# Patient Record
Sex: Female | Born: 1950 | Race: White | Hispanic: No | Marital: Single | Smoking: Current some day smoker
Health system: Southern US, Community
[De-identification: ages and names within clinical notes are randomized; demographics above are authoritative.]

## PROBLEM LIST (undated history)

## (undated) DIAGNOSIS — J449 Chronic obstructive pulmonary disease, unspecified: Secondary | ICD-10-CM

## (undated) DIAGNOSIS — M545 Low back pain, unspecified: Secondary | ICD-10-CM

## (undated) DIAGNOSIS — I209 Angina pectoris, unspecified: Secondary | ICD-10-CM

## (undated) DIAGNOSIS — R0902 Hypoxemia: Secondary | ICD-10-CM

## (undated) DIAGNOSIS — I5030 Unspecified diastolic (congestive) heart failure: Secondary | ICD-10-CM

## (undated) DIAGNOSIS — M810 Age-related osteoporosis without current pathological fracture: Secondary | ICD-10-CM

## (undated) DIAGNOSIS — I739 Peripheral vascular disease, unspecified: Secondary | ICD-10-CM

## (undated) DIAGNOSIS — I10 Essential (primary) hypertension: Secondary | ICD-10-CM

## (undated) DIAGNOSIS — G8929 Other chronic pain: Secondary | ICD-10-CM

## (undated) DIAGNOSIS — I639 Cerebral infarction, unspecified: Secondary | ICD-10-CM

## (undated) DIAGNOSIS — K219 Gastro-esophageal reflux disease without esophagitis: Secondary | ICD-10-CM

## (undated) DIAGNOSIS — E7800 Pure hypercholesterolemia, unspecified: Secondary | ICD-10-CM

## (undated) DIAGNOSIS — K635 Polyp of colon: Secondary | ICD-10-CM

## (undated) DIAGNOSIS — Z8719 Personal history of other diseases of the digestive system: Secondary | ICD-10-CM

## (undated) DIAGNOSIS — I2510 Atherosclerotic heart disease of native coronary artery without angina pectoris: Secondary | ICD-10-CM

## (undated) DIAGNOSIS — E669 Obesity, unspecified: Secondary | ICD-10-CM

## (undated) DIAGNOSIS — D126 Benign neoplasm of colon, unspecified: Secondary | ICD-10-CM

## (undated) DIAGNOSIS — E785 Hyperlipidemia, unspecified: Secondary | ICD-10-CM

## (undated) DIAGNOSIS — M1990 Unspecified osteoarthritis, unspecified site: Secondary | ICD-10-CM

## (undated) HISTORY — PX: EYE SURGERY: SHX253

## (undated) HISTORY — PX: ABDOMINAL HYSTERECTOMY: SHX81

## (undated) HISTORY — PX: PARTIAL HYSTERECTOMY: SHX80

## (undated) HISTORY — PX: VEIN LIGATION AND STRIPPING: SHX2653

## (undated) HISTORY — PX: APPENDECTOMY: SHX54

## (undated) HISTORY — PX: CHOLECYSTECTOMY: SHX55

## (undated) HISTORY — PX: BACK SURGERY: SHX140

---

## 2000-10-19 ENCOUNTER — Ambulatory Visit (HOSPITAL_COMMUNITY): Admission: RE | Admit: 2000-10-19 | Discharge: 2000-10-19 | Payer: Self-pay | Admitting: General Surgery

## 2000-12-19 ENCOUNTER — Encounter: Payer: Self-pay | Admitting: Internal Medicine

## 2000-12-19 ENCOUNTER — Ambulatory Visit (HOSPITAL_COMMUNITY): Admission: RE | Admit: 2000-12-19 | Discharge: 2000-12-19 | Payer: Self-pay | Admitting: Internal Medicine

## 2001-01-02 ENCOUNTER — Emergency Department (HOSPITAL_COMMUNITY): Admission: EM | Admit: 2001-01-02 | Discharge: 2001-01-03 | Payer: Self-pay | Admitting: *Deleted

## 2001-01-11 ENCOUNTER — Ambulatory Visit (HOSPITAL_COMMUNITY): Admission: RE | Admit: 2001-01-11 | Discharge: 2001-01-11 | Payer: Self-pay | Admitting: Internal Medicine

## 2001-01-11 ENCOUNTER — Encounter: Payer: Self-pay | Admitting: Internal Medicine

## 2001-01-13 ENCOUNTER — Ambulatory Visit (HOSPITAL_COMMUNITY): Admission: RE | Admit: 2001-01-13 | Discharge: 2001-01-13 | Payer: Self-pay | Admitting: Internal Medicine

## 2001-01-13 ENCOUNTER — Encounter: Payer: Self-pay | Admitting: Internal Medicine

## 2001-01-31 ENCOUNTER — Encounter: Payer: Self-pay | Admitting: Orthopedic Surgery

## 2001-01-31 ENCOUNTER — Encounter: Admission: RE | Admit: 2001-01-31 | Discharge: 2001-01-31 | Payer: Self-pay | Admitting: *Deleted

## 2001-02-01 ENCOUNTER — Emergency Department (HOSPITAL_COMMUNITY): Admission: EM | Admit: 2001-02-01 | Discharge: 2001-02-01 | Payer: Self-pay | Admitting: Emergency Medicine

## 2002-12-03 ENCOUNTER — Inpatient Hospital Stay (HOSPITAL_COMMUNITY): Admission: RE | Admit: 2002-12-03 | Discharge: 2002-12-07 | Payer: Self-pay | Admitting: Critical Care Medicine

## 2002-12-16 ENCOUNTER — Emergency Department (HOSPITAL_COMMUNITY): Admission: EM | Admit: 2002-12-16 | Discharge: 2002-12-16 | Payer: Self-pay | Admitting: Emergency Medicine

## 2003-02-19 ENCOUNTER — Emergency Department (HOSPITAL_COMMUNITY): Admission: EM | Admit: 2003-02-19 | Discharge: 2003-02-20 | Payer: Self-pay | Admitting: Emergency Medicine

## 2003-02-21 ENCOUNTER — Inpatient Hospital Stay (HOSPITAL_COMMUNITY): Admission: AD | Admit: 2003-02-21 | Discharge: 2003-02-25 | Payer: Self-pay | Admitting: Critical Care Medicine

## 2003-02-26 ENCOUNTER — Inpatient Hospital Stay (HOSPITAL_COMMUNITY): Admission: EM | Admit: 2003-02-26 | Discharge: 2003-03-04 | Payer: Self-pay | Admitting: Pulmonary Disease

## 2003-03-08 ENCOUNTER — Inpatient Hospital Stay (HOSPITAL_COMMUNITY): Admission: AD | Admit: 2003-03-08 | Discharge: 2003-03-11 | Payer: Self-pay | Admitting: Critical Care Medicine

## 2003-03-17 ENCOUNTER — Other Ambulatory Visit: Payer: Self-pay

## 2003-03-18 ENCOUNTER — Other Ambulatory Visit: Payer: Self-pay

## 2003-04-02 ENCOUNTER — Other Ambulatory Visit: Payer: Self-pay

## 2003-12-17 ENCOUNTER — Ambulatory Visit: Payer: Self-pay | Admitting: Critical Care Medicine

## 2004-03-05 ENCOUNTER — Observation Stay: Payer: Self-pay | Admitting: Internal Medicine

## 2004-03-05 ENCOUNTER — Other Ambulatory Visit: Payer: Self-pay

## 2004-04-07 ENCOUNTER — Emergency Department: Payer: Self-pay | Admitting: Emergency Medicine

## 2004-05-19 ENCOUNTER — Encounter: Admission: RE | Admit: 2004-05-19 | Discharge: 2004-05-19 | Payer: Self-pay | Admitting: *Deleted

## 2004-06-24 ENCOUNTER — Emergency Department: Payer: Self-pay | Admitting: Emergency Medicine

## 2004-06-24 ENCOUNTER — Other Ambulatory Visit: Payer: Self-pay

## 2004-08-20 ENCOUNTER — Inpatient Hospital Stay: Payer: Self-pay | Admitting: Cardiovascular Disease

## 2004-08-20 ENCOUNTER — Other Ambulatory Visit: Payer: Self-pay

## 2004-12-24 ENCOUNTER — Emergency Department: Payer: Self-pay | Admitting: Emergency Medicine

## 2004-12-24 ENCOUNTER — Other Ambulatory Visit: Payer: Self-pay

## 2005-01-12 ENCOUNTER — Inpatient Hospital Stay: Payer: Self-pay | Admitting: Internal Medicine

## 2005-01-12 ENCOUNTER — Other Ambulatory Visit: Payer: Self-pay

## 2005-02-08 ENCOUNTER — Inpatient Hospital Stay: Payer: Self-pay | Admitting: Surgery

## 2005-03-16 ENCOUNTER — Ambulatory Visit: Payer: Self-pay | Admitting: Gastroenterology

## 2005-09-29 ENCOUNTER — Emergency Department: Payer: Self-pay | Admitting: Emergency Medicine

## 2005-09-29 ENCOUNTER — Other Ambulatory Visit: Payer: Self-pay

## 2006-01-04 HISTORY — PX: CORONARY ANGIOPLASTY: SHX604

## 2006-04-20 ENCOUNTER — Ambulatory Visit: Payer: Self-pay | Admitting: Gastroenterology

## 2006-10-12 ENCOUNTER — Inpatient Hospital Stay: Payer: Self-pay | Admitting: Internal Medicine

## 2006-10-12 ENCOUNTER — Other Ambulatory Visit: Payer: Self-pay

## 2006-12-11 ENCOUNTER — Other Ambulatory Visit: Payer: Self-pay

## 2006-12-11 ENCOUNTER — Emergency Department: Payer: Self-pay | Admitting: Emergency Medicine

## 2007-01-05 HISTORY — PX: CAROTID ENDARTERECTOMY: SUR193

## 2007-03-20 ENCOUNTER — Emergency Department: Payer: Self-pay | Admitting: Emergency Medicine

## 2007-10-11 ENCOUNTER — Ambulatory Visit: Payer: Self-pay | Admitting: Unknown Physician Specialty

## 2007-11-27 ENCOUNTER — Ambulatory Visit: Payer: Self-pay | Admitting: Gastroenterology

## 2007-12-09 ENCOUNTER — Inpatient Hospital Stay: Payer: Self-pay | Admitting: *Deleted

## 2008-01-05 HISTORY — PX: IMPLANTATION BONE ANCHORED HEARING AID: SUR691

## 2008-01-15 ENCOUNTER — Emergency Department: Payer: Self-pay | Admitting: Internal Medicine

## 2009-09-01 ENCOUNTER — Ambulatory Visit: Payer: Self-pay | Admitting: Gastroenterology

## 2010-07-17 ENCOUNTER — Ambulatory Visit: Payer: Self-pay | Admitting: Family Medicine

## 2010-12-05 ENCOUNTER — Emergency Department: Payer: Self-pay | Admitting: Emergency Medicine

## 2011-03-28 LAB — CBC
HGB: 13.8 g/dL (ref 12.0–16.0)
MCH: 32.4 pg (ref 26.0–34.0)
MCV: 97 fL (ref 80–100)
Platelet: 219 10*3/uL (ref 150–440)
RBC: 4.24 10*6/uL (ref 3.80–5.20)
RDW: 13.8 % (ref 11.5–14.5)

## 2011-03-28 LAB — CK TOTAL AND CKMB (NOT AT ARMC): CK-MB: 0.6 ng/mL (ref 0.5–3.6)

## 2011-03-28 LAB — COMPREHENSIVE METABOLIC PANEL
BUN: 13 mg/dL (ref 7–18)
Calcium, Total: 8.7 mg/dL (ref 8.5–10.1)
Co2: 26 mmol/L (ref 21–32)
Creatinine: 0.8 mg/dL (ref 0.60–1.30)
EGFR (Non-African Amer.): >60
Glucose: 204 mg/dL — ABNORMAL HIGH (ref 65–99)
Osmolality: 287 (ref 275–301)
Potassium: 3.6 mmol/L (ref 3.5–5.1)
SGPT (ALT): 19 U/L
Total Protein: 7.3 g/dL (ref 6.4–8.2)

## 2011-03-29 ENCOUNTER — Inpatient Hospital Stay: Payer: Self-pay | Admitting: Internal Medicine

## 2011-03-30 LAB — HEMOGLOBIN A1C: Hemoglobin A1C: 5.8 % (ref 4.2–6.3)

## 2011-04-03 LAB — CULTURE, BLOOD (SINGLE)

## 2011-08-09 ENCOUNTER — Ambulatory Visit: Payer: Self-pay | Admitting: Internal Medicine

## 2011-08-13 ENCOUNTER — Inpatient Hospital Stay: Payer: Self-pay | Admitting: Internal Medicine

## 2011-08-13 LAB — COMPREHENSIVE METABOLIC PANEL
Albumin: 3.4 g/dL (ref 3.4–5.0)
Anion Gap: 6 — ABNORMAL LOW (ref 7–16)
BUN: 12 mg/dL (ref 7–18)
Calcium, Total: 8.8 mg/dL (ref 8.5–10.1)
Glucose: 113 mg/dL — ABNORMAL HIGH (ref 65–99)
SGOT(AST): 28 U/L (ref 15–37)
SGPT (ALT): 33 U/L (ref 12–78)
Total Protein: 6.8 g/dL (ref 6.4–8.2)

## 2011-08-13 LAB — CBC
MCH: 32.8 pg (ref 26.0–34.0)
MCHC: 34.2 g/dL (ref 32.0–36.0)
Platelet: 190 10*3/uL (ref 150–440)
RBC: 4.03 10*6/uL (ref 3.80–5.20)
RDW: 13.4 % (ref 11.5–14.5)
WBC: 14 10*3/uL — ABNORMAL HIGH (ref 3.6–11.0)

## 2011-08-13 LAB — TROPONIN I: Troponin-I: <0.02 ng/mL

## 2011-08-14 LAB — CBC WITH DIFFERENTIAL/PLATELET
Basophil #: 0 10*3/uL (ref 0.0–0.1)
Eosinophil #: 0 10*3/uL (ref 0.0–0.7)
HCT: 37.6 % (ref 35.0–47.0)
Lymphocyte %: 9.5 %
MCH: 32.2 pg (ref 26.0–34.0)
MCHC: 33.6 g/dL (ref 32.0–36.0)
MCV: 96 fL (ref 80–100)
Monocyte %: 4 %
Neutrophil %: 86.3 %
RDW: 13 % (ref 11.5–14.5)
WBC: 25 10*3/uL — ABNORMAL HIGH (ref 3.6–11.0)

## 2012-10-15 ENCOUNTER — Inpatient Hospital Stay: Payer: Self-pay | Admitting: Family Medicine

## 2012-10-15 LAB — CBC
MCH: 32.4 pg (ref 26.0–34.0)
RBC: 4.11 10*6/uL (ref 3.80–5.20)
RDW: 12.8 % (ref 11.5–14.5)

## 2012-10-15 LAB — COMPREHENSIVE METABOLIC PANEL
Alkaline Phosphatase: 124 U/L (ref 50–136)
Anion Gap: 6 — ABNORMAL LOW (ref 7–16)
BUN: 12 mg/dL (ref 7–18)
Calcium, Total: 9.7 mg/dL (ref 8.5–10.1)
Chloride: 103 mmol/L (ref 98–107)
Creatinine: 0.77 mg/dL (ref 0.60–1.30)
EGFR (African American): >60
SGOT(AST): 15 U/L (ref 15–37)
SGPT (ALT): 20 U/L (ref 12–78)

## 2012-10-15 LAB — TROPONIN I: Troponin-I: <0.02 ng/mL

## 2012-10-16 LAB — CBC WITH DIFFERENTIAL/PLATELET
Eosinophil %: 0 %
Lymphocyte %: 16.9 %
MCHC: 34 g/dL (ref 32.0–36.0)
MCV: 96 fL (ref 80–100)
Neutrophil #: 13.7 10*3/uL — ABNORMAL HIGH (ref 1.4–6.5)
Platelet: 257 10*3/uL (ref 150–440)
RBC: 3.88 10*6/uL (ref 3.80–5.20)
RDW: 13.1 % (ref 11.5–14.5)

## 2012-10-16 LAB — BASIC METABOLIC PANEL
BUN: 17 mg/dL (ref 7–18)
Calcium, Total: 9.6 mg/dL (ref 8.5–10.1)
Chloride: 103 mmol/L (ref 98–107)
Creatinine: 0.74 mg/dL (ref 0.60–1.30)
Potassium: 4.8 mmol/L (ref 3.5–5.1)

## 2012-11-07 ENCOUNTER — Ambulatory Visit: Payer: Self-pay | Admitting: Gastroenterology

## 2012-11-08 LAB — PATHOLOGY REPORT

## 2012-12-31 ENCOUNTER — Inpatient Hospital Stay: Payer: Self-pay | Admitting: Internal Medicine

## 2012-12-31 LAB — BASIC METABOLIC PANEL
BUN: 8 mg/dL (ref 7–18)
Co2: 26 mmol/L (ref 21–32)
EGFR (African American): >60
EGFR (Non-African Amer.): >60
Osmolality: 279 (ref 275–301)
Potassium: 3.8 mmol/L (ref 3.5–5.1)
Sodium: 140 mmol/L (ref 136–145)

## 2012-12-31 LAB — CBC
HGB: 13.4 g/dL (ref 12.0–16.0)
RBC: 4.38 10*6/uL (ref 3.80–5.20)
WBC: 8.1 10*3/uL (ref 3.6–11.0)

## 2012-12-31 LAB — RAPID INFLUENZA A&B ANTIGENS

## 2013-01-01 LAB — BASIC METABOLIC PANEL
Anion Gap: 4 — ABNORMAL LOW (ref 7–16)
BUN: 13 mg/dL (ref 7–18)
Calcium, Total: 9 mg/dL (ref 8.5–10.1)
Co2: 29 mmol/L (ref 21–32)
Creatinine: 0.76 mg/dL (ref 0.60–1.30)
Potassium: 3.8 mmol/L (ref 3.5–5.1)
Sodium: 138 mmol/L (ref 136–145)

## 2013-01-01 LAB — CBC WITH DIFFERENTIAL/PLATELET
HCT: 37.9 % (ref 35.0–47.0)
HGB: 12.6 g/dL (ref 12.0–16.0)
Lymphocyte #: 2.1 10*3/uL (ref 1.0–3.6)
Lymphocyte %: 22.2 %
Monocyte #: 0.2 x10 3/mm (ref 0.2–0.9)
Neutrophil #: 7.2 10*3/uL — ABNORMAL HIGH (ref 1.4–6.5)
Neutrophil %: 75.4 %
WBC: 9.5 10*3/uL (ref 3.6–11.0)

## 2013-01-01 LAB — MAGNESIUM: Magnesium: 1.8 mg/dL

## 2013-01-04 LAB — CREATININE, SERUM
Creatinine: 0.71 mg/dL (ref 0.60–1.30)
EGFR (African American): >60
EGFR (Non-African Amer.): >60

## 2013-01-05 LAB — EXPECTORATED SPUTUM ASSESSMENT W GRAM STAIN, RFLX TO RESP C

## 2013-01-08 ENCOUNTER — Emergency Department: Payer: Self-pay | Admitting: Emergency Medicine

## 2013-01-08 LAB — COMPREHENSIVE METABOLIC PANEL
ALBUMIN: 3 g/dL — AB (ref 3.4–5.0)
Alkaline Phosphatase: 82 U/L
Anion Gap: 3 — ABNORMAL LOW (ref 7–16)
BUN: 11 mg/dL (ref 7–18)
Bilirubin,Total: 0.5 mg/dL (ref 0.2–1.0)
CALCIUM: 8.7 mg/dL (ref 8.5–10.1)
CO2: 34 mmol/L — AB (ref 21–32)
CREATININE: 0.66 mg/dL (ref 0.60–1.30)
Chloride: 98 mmol/L (ref 98–107)
EGFR (African American): >60
Glucose: 102 mg/dL — ABNORMAL HIGH (ref 65–99)
OSMOLALITY: 270 (ref 275–301)
Potassium: 4 mmol/L (ref 3.5–5.1)
SGOT(AST): 25 U/L (ref 15–37)
SGPT (ALT): 47 U/L (ref 12–78)
Sodium: 135 mmol/L — ABNORMAL LOW (ref 136–145)
TOTAL PROTEIN: 6.4 g/dL (ref 6.4–8.2)

## 2013-01-08 LAB — CBC
HCT: 38.8 % (ref 35.0–47.0)
HGB: 13 g/dL (ref 12.0–16.0)
MCH: 31 pg (ref 26.0–34.0)
MCHC: 33.4 g/dL (ref 32.0–36.0)
MCV: 93 fL (ref 80–100)
Platelet: 259 10*3/uL (ref 150–440)
RBC: 4.18 10*6/uL (ref 3.80–5.20)
RDW: 13.3 % (ref 11.5–14.5)
WBC: 23.6 10*3/uL — AB (ref 3.6–11.0)

## 2013-01-08 LAB — PRO B NATRIURETIC PEPTIDE: B-Type Natriuretic Peptide: 176 pg/mL — ABNORMAL HIGH (ref 0–125)

## 2013-01-08 LAB — TROPONIN I

## 2013-01-15 ENCOUNTER — Emergency Department: Payer: Self-pay | Admitting: Emergency Medicine

## 2013-01-15 LAB — COMPREHENSIVE METABOLIC PANEL
ALK PHOS: 83 U/L
ANION GAP: 4 — AB (ref 7–16)
Albumin: 3.1 g/dL — ABNORMAL LOW (ref 3.4–5.0)
BUN: 16 mg/dL (ref 7–18)
Bilirubin,Total: 0.7 mg/dL (ref 0.2–1.0)
CALCIUM: 9 mg/dL (ref 8.5–10.1)
CHLORIDE: 104 mmol/L (ref 98–107)
CO2: 30 mmol/L (ref 21–32)
Creatinine: 0.85 mg/dL (ref 0.60–1.30)
EGFR (African American): >60
Glucose: 97 mg/dL (ref 65–99)
OSMOLALITY: 277 (ref 275–301)
POTASSIUM: 4.1 mmol/L (ref 3.5–5.1)
SGOT(AST): 15 U/L (ref 15–37)
SGPT (ALT): 27 U/L (ref 12–78)
Sodium: 138 mmol/L (ref 136–145)
TOTAL PROTEIN: 7.5 g/dL (ref 6.4–8.2)

## 2013-01-15 LAB — CBC WITH DIFFERENTIAL/PLATELET
Basophil #: 0 10*3/uL (ref 0.0–0.1)
Basophil %: 0.2 %
Eosinophil #: 0.1 10*3/uL (ref 0.0–0.7)
Eosinophil %: 1 %
HCT: 39.2 % (ref 35.0–47.0)
HGB: 12.6 g/dL (ref 12.0–16.0)
LYMPHS ABS: 5.2 10*3/uL — AB (ref 1.0–3.6)
Lymphocyte %: 39.6 %
MCH: 30.5 pg (ref 26.0–34.0)
MCHC: 32.2 g/dL (ref 32.0–36.0)
MCV: 95 fL (ref 80–100)
MONO ABS: 1 x10 3/mm — AB (ref 0.2–0.9)
Monocyte %: 7.9 %
NEUTROS ABS: 6.7 10*3/uL — AB (ref 1.4–6.5)
NEUTROS PCT: 51.3 %
PLATELETS: 247 10*3/uL (ref 150–440)
RBC: 4.14 10*6/uL (ref 3.80–5.20)
RDW: 13.1 % (ref 11.5–14.5)
WBC: 13 10*3/uL — ABNORMAL HIGH (ref 3.6–11.0)

## 2013-01-15 LAB — LIPASE, BLOOD: LIPASE: 75 U/L (ref 73–393)

## 2013-01-15 LAB — CK TOTAL AND CKMB (NOT AT ARMC)
CK, Total: 23 U/L (ref 21–215)
CK-MB: <0.5 ng/mL — ABNORMAL LOW (ref 0.5–3.6)

## 2013-01-15 LAB — MAGNESIUM: Magnesium: 2.2 mg/dL

## 2013-01-15 LAB — TROPONIN I: Troponin-I: <0.02 ng/mL

## 2013-01-16 ENCOUNTER — Emergency Department: Payer: Self-pay | Admitting: Emergency Medicine

## 2013-01-16 LAB — CBC
HCT: 37.2 % (ref 35.0–47.0)
HGB: 12.3 g/dL (ref 12.0–16.0)
MCH: 30.5 pg (ref 26.0–34.0)
MCHC: 33 g/dL (ref 32.0–36.0)
MCV: 93 fL (ref 80–100)
PLATELETS: 242 10*3/uL (ref 150–440)
RBC: 4.02 10*6/uL (ref 3.80–5.20)
RDW: 13 % (ref 11.5–14.5)
WBC: 14.4 10*3/uL — ABNORMAL HIGH (ref 3.6–11.0)

## 2013-01-16 LAB — HEPATIC FUNCTION PANEL A (ARMC)
ALT: 40 U/L (ref 12–78)
Albumin: 3 g/dL — ABNORMAL LOW (ref 3.4–5.0)
Alkaline Phosphatase: 112 U/L
Bilirubin,Total: 0.5 mg/dL (ref 0.2–1.0)
SGOT(AST): 30 U/L (ref 15–37)
Total Protein: 7.4 g/dL (ref 6.4–8.2)

## 2013-01-16 LAB — BASIC METABOLIC PANEL
ANION GAP: 6 — AB (ref 7–16)
BUN: 17 mg/dL (ref 7–18)
CO2: 27 mmol/L (ref 21–32)
Calcium, Total: 9.4 mg/dL (ref 8.5–10.1)
Chloride: 105 mmol/L (ref 98–107)
Creatinine: 0.84 mg/dL (ref 0.60–1.30)
EGFR (Non-African Amer.): >60
Glucose: 109 mg/dL — ABNORMAL HIGH (ref 65–99)
Osmolality: 278 (ref 275–301)
POTASSIUM: 3.9 mmol/L (ref 3.5–5.1)
SODIUM: 138 mmol/L (ref 136–145)

## 2013-01-16 LAB — LIPASE, BLOOD: LIPASE: 84 U/L (ref 73–393)

## 2013-01-16 LAB — TROPONIN I: Troponin-I: <0.02 ng/mL

## 2013-02-16 ENCOUNTER — Inpatient Hospital Stay: Payer: Self-pay | Admitting: Internal Medicine

## 2013-02-16 LAB — COMPREHENSIVE METABOLIC PANEL
ALBUMIN: 3.6 g/dL (ref 3.4–5.0)
ANION GAP: 9 (ref 7–16)
Alkaline Phosphatase: 92 U/L
BILIRUBIN TOTAL: 0.2 mg/dL (ref 0.2–1.0)
BUN: 9 mg/dL (ref 7–18)
CREATININE: 0.85 mg/dL (ref 0.60–1.30)
Calcium, Total: 8.6 mg/dL (ref 8.5–10.1)
Chloride: 106 mmol/L (ref 98–107)
Co2: 25 mmol/L (ref 21–32)
EGFR (African American): >60
GLUCOSE: 116 mg/dL — AB (ref 65–99)
OSMOLALITY: 279 (ref 275–301)
POTASSIUM: 3.7 mmol/L (ref 3.5–5.1)
SGOT(AST): 26 U/L (ref 15–37)
SGPT (ALT): 26 U/L (ref 12–78)
Sodium: 140 mmol/L (ref 136–145)
TOTAL PROTEIN: 7.4 g/dL (ref 6.4–8.2)

## 2013-02-16 LAB — TROPONIN I

## 2013-02-16 LAB — CBC
HCT: 39.4 % (ref 35.0–47.0)
HGB: 13 g/dL (ref 12.0–16.0)
MCH: 31.1 pg (ref 26.0–34.0)
MCHC: 33 g/dL (ref 32.0–36.0)
MCV: 94 fL (ref 80–100)
Platelet: 190 10*3/uL (ref 150–440)
RBC: 4.18 10*6/uL (ref 3.80–5.20)
RDW: 14 % (ref 11.5–14.5)
WBC: 7.7 10*3/uL (ref 3.6–11.0)

## 2013-05-16 ENCOUNTER — Ambulatory Visit: Payer: Self-pay | Admitting: Unknown Physician Specialty

## 2013-06-02 ENCOUNTER — Inpatient Hospital Stay: Payer: Self-pay | Admitting: Internal Medicine

## 2013-06-02 LAB — CBC
HCT: 38.3 % (ref 35.0–47.0)
HGB: 12.5 g/dL (ref 12.0–16.0)
MCH: 31 pg (ref 26.0–34.0)
MCHC: 32.6 g/dL (ref 32.0–36.0)
MCV: 95 fL (ref 80–100)
Platelet: 227 10*3/uL (ref 150–440)
RBC: 4.02 10*6/uL (ref 3.80–5.20)
RDW: 13.2 % (ref 11.5–14.5)
WBC: 12.7 10*3/uL — ABNORMAL HIGH (ref 3.6–11.0)

## 2013-06-02 LAB — BASIC METABOLIC PANEL
ANION GAP: 5 — AB (ref 7–16)
BUN: 15 mg/dL (ref 7–18)
CALCIUM: 8.7 mg/dL (ref 8.5–10.1)
Chloride: 110 mmol/L — ABNORMAL HIGH (ref 98–107)
Co2: 26 mmol/L (ref 21–32)
Creatinine: 0.89 mg/dL (ref 0.60–1.30)
EGFR (Non-African Amer.): >60
Glucose: 94 mg/dL (ref 65–99)
Osmolality: 282 (ref 275–301)
Potassium: 3.7 mmol/L (ref 3.5–5.1)
SODIUM: 141 mmol/L (ref 136–145)

## 2013-06-02 LAB — TROPONIN I: Troponin-I: <0.02 ng/mL

## 2013-06-03 LAB — CBC WITH DIFFERENTIAL/PLATELET
BASOS PCT: 0 %
Basophil #: 0 10*3/uL (ref 0.0–0.1)
EOS PCT: 0 %
Eosinophil #: 0 10*3/uL (ref 0.0–0.7)
HCT: 39 % (ref 35.0–47.0)
HGB: 12.6 g/dL (ref 12.0–16.0)
LYMPHS ABS: 3.4 10*3/uL (ref 1.0–3.6)
Lymphocyte %: 19.6 %
MCH: 30.9 pg (ref 26.0–34.0)
MCHC: 32.3 g/dL (ref 32.0–36.0)
MCV: 96 fL (ref 80–100)
Monocyte #: 0.3 x10 3/mm (ref 0.2–0.9)
Monocyte %: 1.6 %
NEUTROS ABS: 13.7 10*3/uL — AB (ref 1.4–6.5)
Neutrophil %: 78.8 %
Platelet: 244 10*3/uL (ref 150–440)
RBC: 4.08 10*6/uL (ref 3.80–5.20)
RDW: 13.2 % (ref 11.5–14.5)
WBC: 17.4 10*3/uL — AB (ref 3.6–11.0)

## 2013-06-03 LAB — BASIC METABOLIC PANEL
Anion Gap: 9 (ref 7–16)
BUN: 15 mg/dL (ref 7–18)
CALCIUM: 9.1 mg/dL (ref 8.5–10.1)
CHLORIDE: 101 mmol/L (ref 98–107)
CO2: 29 mmol/L (ref 21–32)
CREATININE: 1 mg/dL (ref 0.60–1.30)
EGFR (Non-African Amer.): >60
Glucose: 156 mg/dL — ABNORMAL HIGH (ref 65–99)
Osmolality: 282 (ref 275–301)
Potassium: 3.5 mmol/L (ref 3.5–5.1)
Sodium: 139 mmol/L (ref 136–145)

## 2013-06-05 LAB — CREATININE, SERUM
Creatinine: 0.89 mg/dL (ref 0.60–1.30)
EGFR (African American): >60

## 2013-07-20 ENCOUNTER — Inpatient Hospital Stay: Payer: Self-pay | Admitting: Family Medicine

## 2013-07-20 LAB — BASIC METABOLIC PANEL
ANION GAP: 6 — AB (ref 7–16)
BUN: 15 mg/dL (ref 7–18)
CHLORIDE: 106 mmol/L (ref 98–107)
Calcium, Total: 8.7 mg/dL (ref 8.5–10.1)
Co2: 27 mmol/L (ref 21–32)
Creatinine: 0.8 mg/dL (ref 0.60–1.30)
EGFR (African American): >60
Glucose: 94 mg/dL (ref 65–99)
Osmolality: 278 (ref 275–301)
Potassium: 4.3 mmol/L (ref 3.5–5.1)
SODIUM: 139 mmol/L (ref 136–145)

## 2013-07-20 LAB — CBC
HCT: 39.8 % (ref 35.0–47.0)
HGB: 12.8 g/dL (ref 12.0–16.0)
MCH: 30.7 pg (ref 26.0–34.0)
MCHC: 32.1 g/dL (ref 32.0–36.0)
MCV: 95 fL (ref 80–100)
Platelet: 245 10*3/uL (ref 150–440)
RBC: 4.17 10*6/uL (ref 3.80–5.20)
RDW: 13.4 % (ref 11.5–14.5)
WBC: 11.4 10*3/uL — ABNORMAL HIGH (ref 3.6–11.0)

## 2013-07-20 LAB — TROPONIN I: Troponin-I: <0.02 ng/mL

## 2013-07-21 LAB — CBC WITH DIFFERENTIAL/PLATELET
Basophil #: 0 10*3/uL (ref 0.0–0.1)
Basophil %: 0 %
EOS PCT: 0 %
Eosinophil #: 0 10*3/uL (ref 0.0–0.7)
HCT: 38.9 % (ref 35.0–47.0)
HGB: 12.5 g/dL (ref 12.0–16.0)
LYMPHS ABS: 2.4 10*3/uL (ref 1.0–3.6)
LYMPHS PCT: 24.9 %
MCH: 30.8 pg (ref 26.0–34.0)
MCHC: 32 g/dL (ref 32.0–36.0)
MCV: 96 fL (ref 80–100)
Monocyte #: 0.1 x10 3/mm — ABNORMAL LOW (ref 0.2–0.9)
Monocyte %: 0.7 %
NEUTROS ABS: 7 10*3/uL — AB (ref 1.4–6.5)
Neutrophil %: 74.4 %
PLATELETS: 235 10*3/uL (ref 150–440)
RBC: 4.04 10*6/uL (ref 3.80–5.20)
RDW: 13.7 % (ref 11.5–14.5)
WBC: 9.5 10*3/uL (ref 3.6–11.0)

## 2013-07-21 LAB — BASIC METABOLIC PANEL
Anion Gap: 9 (ref 7–16)
BUN: 14 mg/dL (ref 7–18)
Calcium, Total: 8.9 mg/dL (ref 8.5–10.1)
Chloride: 104 mmol/L (ref 98–107)
Co2: 25 mmol/L (ref 21–32)
Creatinine: 0.88 mg/dL (ref 0.60–1.30)
EGFR (African American): >60
EGFR (Non-African Amer.): >60
Glucose: 161 mg/dL — ABNORMAL HIGH (ref 65–99)
Osmolality: 280 (ref 275–301)
Potassium: 4.1 mmol/L (ref 3.5–5.1)
Sodium: 138 mmol/L (ref 136–145)

## 2013-07-24 LAB — THEOPHYLLINE LEVEL

## 2013-07-24 LAB — BASIC METABOLIC PANEL
Anion Gap: 7 (ref 7–16)
BUN: 22 mg/dL — ABNORMAL HIGH (ref 7–18)
CALCIUM: 8.5 mg/dL (ref 8.5–10.1)
Chloride: 103 mmol/L (ref 98–107)
Co2: 30 mmol/L (ref 21–32)
Creatinine: 0.87 mg/dL (ref 0.60–1.30)
EGFR (African American): >60
GLUCOSE: 147 mg/dL — AB (ref 65–99)
Osmolality: 285 (ref 275–301)
Potassium: 3.7 mmol/L (ref 3.5–5.1)
Sodium: 140 mmol/L (ref 136–145)

## 2013-07-24 LAB — CBC WITH DIFFERENTIAL/PLATELET
BASOS ABS: 0 10*3/uL (ref 0.0–0.1)
BASOS PCT: 0 %
Eosinophil #: 0 10*3/uL (ref 0.0–0.7)
Eosinophil %: 0 %
HCT: 36.3 % (ref 35.0–47.0)
HGB: 11.7 g/dL — ABNORMAL LOW (ref 12.0–16.0)
Lymphocyte #: 3.5 10*3/uL (ref 1.0–3.6)
Lymphocyte %: 23.6 %
MCH: 31 pg (ref 26.0–34.0)
MCHC: 32.2 g/dL (ref 32.0–36.0)
MCV: 96 fL (ref 80–100)
MONOS PCT: 3.7 %
Monocyte #: 0.6 x10 3/mm (ref 0.2–0.9)
Neutrophil #: 10.9 10*3/uL — ABNORMAL HIGH (ref 1.4–6.5)
Neutrophil %: 72.7 %
Platelet: 239 10*3/uL (ref 150–440)
RBC: 3.78 10*6/uL — ABNORMAL LOW (ref 3.80–5.20)
RDW: 13.6 % (ref 11.5–14.5)
WBC: 15 10*3/uL — ABNORMAL HIGH (ref 3.6–11.0)

## 2013-09-06 ENCOUNTER — Ambulatory Visit: Payer: Self-pay | Admitting: Unknown Physician Specialty

## 2013-09-24 ENCOUNTER — Ambulatory Visit: Payer: Self-pay

## 2013-10-02 ENCOUNTER — Ambulatory Visit: Payer: Self-pay | Admitting: Internal Medicine

## 2013-10-04 ENCOUNTER — Ambulatory Visit: Payer: Self-pay

## 2013-10-23 ENCOUNTER — Ambulatory Visit: Payer: Self-pay | Admitting: Specialist

## 2013-11-02 ENCOUNTER — Ambulatory Visit: Payer: Self-pay | Admitting: Unknown Physician Specialty

## 2014-01-27 ENCOUNTER — Emergency Department: Payer: Self-pay | Admitting: Emergency Medicine

## 2014-01-27 LAB — COMPREHENSIVE METABOLIC PANEL
ALBUMIN: 3.5 g/dL (ref 3.4–5.0)
ALK PHOS: 110 U/L
Anion Gap: 6 — ABNORMAL LOW (ref 7–16)
BILIRUBIN TOTAL: 0.2 mg/dL (ref 0.2–1.0)
BUN: 15 mg/dL (ref 7–18)
CHLORIDE: 106 mmol/L (ref 98–107)
Calcium, Total: 8.9 mg/dL (ref 8.5–10.1)
Co2: 27 mmol/L (ref 21–32)
Creatinine: 0.76 mg/dL (ref 0.60–1.30)
EGFR (African American): >60
EGFR (Non-African Amer.): >60
GLUCOSE: 101 mg/dL — AB (ref 65–99)
OSMOLALITY: 279 (ref 275–301)
Potassium: 3.9 mmol/L (ref 3.5–5.1)
SGOT(AST): 26 U/L (ref 15–37)
SGPT (ALT): 19 U/L
Sodium: 139 mmol/L (ref 136–145)
Total Protein: 7.6 g/dL (ref 6.4–8.2)

## 2014-01-27 LAB — CK TOTAL AND CKMB (NOT AT ARMC)
CK, TOTAL: 64 U/L (ref 26–192)
CK-MB: 1.1 ng/mL (ref 0.5–3.6)

## 2014-01-27 LAB — CBC
HCT: 43.1 % (ref 35.0–47.0)
HGB: 14 g/dL (ref 12.0–16.0)
MCH: 30.7 pg (ref 26.0–34.0)
MCHC: 32.5 g/dL (ref 32.0–36.0)
MCV: 94 fL (ref 80–100)
PLATELETS: 248 10*3/uL (ref 150–440)
RBC: 4.56 10*6/uL (ref 3.80–5.20)
RDW: 13.8 % (ref 11.5–14.5)
WBC: 11.5 10*3/uL — AB (ref 3.6–11.0)

## 2014-01-27 LAB — TROPONIN I

## 2014-01-27 LAB — PRO B NATRIURETIC PEPTIDE: B-TYPE NATIURETIC PEPTID: 133 pg/mL — AB (ref 0–125)

## 2014-02-03 ENCOUNTER — Inpatient Hospital Stay: Payer: Self-pay | Admitting: Internal Medicine

## 2014-02-03 LAB — TROPONIN I: Troponin-I: <0.02 ng/mL

## 2014-02-03 LAB — CBC
HCT: 38.9 % (ref 35.0–47.0)
HGB: 12.7 g/dL (ref 12.0–16.0)
MCH: 30.8 pg (ref 26.0–34.0)
MCHC: 32.8 g/dL (ref 32.0–36.0)
MCV: 94 fL (ref 80–100)
Platelet: 226 10*3/uL (ref 150–440)
RBC: 4.13 10*6/uL (ref 3.80–5.20)
RDW: 13.8 % (ref 11.5–14.5)
WBC: 14.9 10*3/uL — ABNORMAL HIGH (ref 3.6–11.0)

## 2014-02-03 LAB — BASIC METABOLIC PANEL
ANION GAP: 4 — AB (ref 7–16)
BUN: 14 mg/dL (ref 7–18)
CALCIUM: 8.9 mg/dL (ref 8.5–10.1)
CHLORIDE: 105 mmol/L (ref 98–107)
CO2: 31 mmol/L (ref 21–32)
CREATININE: 0.74 mg/dL (ref 0.60–1.30)
EGFR (African American): >60
EGFR (Non-African Amer.): >60
Glucose: 108 mg/dL — ABNORMAL HIGH (ref 65–99)
OSMOLALITY: 280 (ref 275–301)
Potassium: 4 mmol/L (ref 3.5–5.1)
Sodium: 140 mmol/L (ref 136–145)

## 2014-02-03 LAB — PRO B NATRIURETIC PEPTIDE: B-TYPE NATIURETIC PEPTID: 169 pg/mL — AB (ref 0–125)

## 2014-02-04 LAB — CBC WITH DIFFERENTIAL/PLATELET
Basophil #: 0 10*3/uL (ref 0.0–0.1)
Basophil %: 0.2 %
EOS ABS: 0 10*3/uL (ref 0.0–0.7)
Eosinophil %: 0 %
HCT: 38.6 % (ref 35.0–47.0)
HGB: 12.5 g/dL (ref 12.0–16.0)
Lymphocyte #: 3 10*3/uL (ref 1.0–3.6)
Lymphocyte %: 17 %
MCH: 30.3 pg (ref 26.0–34.0)
MCHC: 32.4 g/dL (ref 32.0–36.0)
MCV: 93 fL (ref 80–100)
MONO ABS: 0.3 x10 3/mm (ref 0.2–0.9)
Monocyte %: 1.8 %
NEUTROS PCT: 81 %
Neutrophil #: 14.2 10*3/uL — ABNORMAL HIGH (ref 1.4–6.5)
PLATELETS: 233 10*3/uL (ref 150–440)
RBC: 4.13 10*6/uL (ref 3.80–5.20)
RDW: 13.8 % (ref 11.5–14.5)
WBC: 17.5 10*3/uL — ABNORMAL HIGH (ref 3.6–11.0)

## 2014-02-24 ENCOUNTER — Inpatient Hospital Stay: Payer: Self-pay | Admitting: Internal Medicine

## 2014-04-23 NOTE — H&P (Signed)
PATIENT NAME:  Dawn Donaldson, Dawn Donaldson MR#:  [id number] DATE OF BIRTH:  26-Feb-1950  DATE OF ADMISSION:  08/13/2011  PRIMARY CARE PHYSICIAN:  Dr. Brunetta Genera.   CHIEF COMPLAINT: Increasing shortness of breath and wheezing.   HISTORY OF PRESENT ILLNESS: Dawn Donaldson is a 64 year old Caucasian female with history of chronic obstructive pulmonary disease, on home oxygen, chronic tobacco abuse, has history of oxygen-dependent chronic obstructive pulmonary disease, who comes into the Emergency Room with increase of shortness of breath on and off for a couple of days, however, got worse today, not relieved with breathing treatment at home. She came to the Emergency Room. Chest x-ray was done which does not show pneumonia. She has a chronic smoker's cough and does have some phlegm production. She denies any hemoptysis, fever or any chest pain. The patient is being admitted for acute on chronic hypoxic hypercarbic respiratory failure.   REVIEW OF SYSTEMS: CONSTITUTIONAL: No fever. Positive for fatigue, weakness. EYES: No blurred or double vision or glaucoma. ENT: No tinnitus, ear pain, hearing loss. RESPIRATORY: Positive for cough, wheeze, and chronic obstructive pulmonary disease. CARDIOVASCULAR: No chest pain, orthopnea, or edema. GASTROINTESTINAL: No nausea, vomiting, diarrhea, or abdominal pain. GU: No dysuria or hematuria. ENDOCRINE: No polyuria or nocturia. HEMATOLOGY: No anemia or easy bruising. SKIN: No acne or rash. MUSCULOSKELETAL: No arthritis. NEUROLOGIC: No cerebrovascular accident or transient ischemic attack. PSYCH: No anxiety or depression. All other systems reviewed and negative.   PAST MEDICAL HISTORY:   1. Chronic obstructive pulmonary disease, home oxygen dependent 2 liters.  2. Ongoing tobacco abuse.  3. Hypertension.  4. Gastroesophageal reflux disease. 5. Coronary artery disease status post stent in the past.  6. Peripheral vascular disease with history of right carotid stenosis status post right  carotid endarterectomy.  7. Right ear deafness.   PAST SURGICAL HISTORY:  1. Right carotid endarterectomy.  2. Stent placement for coronary artery disease.   FAMILY HISTORY: Father died of myocardial infarction at age 44, brother had myocardial infarction at 61.   SOCIAL HISTORY: She is unemployed, lives on disability, lives with her boyfriend. Continues to smoke about a pack which last a week. No history of alcoholism or other drug use.   HOME MEDICATIONS:  1. Simvastatin 20 mg at bedtime.  2. Prilosec 20 mg twice a day.  3. Plavix 75 mg daily.  4. Lisinopril 20 mg daily.  5. Fosamax 70 mg q. week.  6. DuoNebs.  7. Terazosin AC 10 milliliters every four hours as needed.   PHYSICAL EXAMINATION:  GENERAL: The patient is awake, alert, oriented x3, not in acute distress.   VITAL SIGNS: Afebrile, pulse 85 and regular, sats are 96% on 2 liters, blood pressure 121/66. Respirations 20 per minute.   HEENT: Atraumatic, normocephalic. Pupils are equal, round, and reactive to light and accommodation. Extraocular movements intact. Oral mucosa is dry.   NECK: Supple. No JVD. No carotid bruit.   RESPIRATORY: Decreased breath sounds in the bases. The patient has wheezing, more posteriorly expiratory wheezing with coarse breath sounds.   CARDIOVASCULAR: Both the heart sounds are normal. Rate and rhythm is regular. PMI not lateralized. Chest is nontender.   EXTREMITIES: Good pedal pulses, good femoral pulses. No lower extremity edema.   ABDOMEN: Soft, benign, and nontender. No organomegaly. Positive bowel sounds.   NEUROLOGIC: Grossly intact cranial nerves II through XII. No motor or sensory deficits.   PSYCH: The patient is awake, alert, oriented x3.   LABORATORY, RADIOLOGICAL AND DIAGNOSTIC DATA: PH 739, pCO2 47,  pO2 of 58, FI02 28, sats 94.3. Cardiac enzymes, first set, negative. CBC within normal limits except white count of 14. Comprehensive metabolic panel within normal limits except  glucose of 130. Chest x-ray: No pneumonia.   ASSESSMENT: 64 year old Dawn Donaldson with history of chronic obstructive pulmonary disease, home oxygen dependent, hypertension, gastroesophageal reflux disease and chronic obstructive pulmonary disease who comes in with: 1. Acute on chronic hypoxic hypercarbic respiratory failure due to chronic obstructive pulmonary disease flare with ongoing tobacco abuse. Chest x-ray shows no pneumonia. Will admit the patient to medical floor, start on IV Solu-Medrol. Continue nebulizer around-the-clock. I will add Spiriva and Advair to the patient's med list. Taper steroids when clinically indicated.  2. Hypertension, currently controlled. We will continue Lisinopril.   3. Coronary artery disease status post stent. Continue Plavix, ACE inhibitors and statin.  4. History of peripheral vascular disease with right carotid endarterectomy, on Plavix.  5. Gastroesophageal reflux disease. Continue PPI.  6. Smoking cessation. Has been counseled, about three minutes spent. The patient does not seem to be motivated. She was explained the complications related to it. She voiced understanding. Will provide nicotine patch if the patient needs it.  7. Further work-up according to the patient's clinical course. Hospital admission plan was discussed with the patient. No family members present.   TIME SPENT: 45 minutes   ____________________________ Gus Height A. Posey Pronto, MD sap:ap D: 08/13/2011 05:33:19 ET T: 08/13/2011 08:00:17 ET JOB#: [id number]  cc: Gevon Markus A. Posey Pronto, MD, <Dictator> Meindert A. Brunetta Genera, MD  Ilda Basset MD ELECTRONICALLY SIGNED 08/16/2011 16:06

## 2014-04-23 NOTE — Discharge Summary (Signed)
PATIENT NAME:  Dawn Donaldson, Dawn Donaldson MR#:  [id number] DATE OF BIRTH:  1950/03/04  DATE OF ADMISSION:  08/13/2011 DATE OF DISCHARGE:  08/15/2011  DIAGNOSES:  1. Acute on chronic respiratory failure secondary to chronic obstructive pulmonary disease  exacerbation. 2. Acute bronchitis. 3. Hypertension. 4. Coronary artery disease. 5. Peripheral vascular disease. 6. Gastroesophageal reflux disease. 7. Smoking.  8. Leukocytosis.   DISPOSITION: Patient is being discharged home.   FOLLOW UP: Follow up with primary care physician, Dr. Brunetta Genera, in 1 to 2 weeks after discharge.   DIET: Low sodium.   ACTIVITY: As tolerated.   DISCHARGE MEDICATIONS:  1. Plavix 75 mg daily.  2. Albuterol 3 to 4 times per day p.r.n.  3. Lisinopril 20 mg daily.  4. Prilosec 20 mg b.i.d.  5. Simvastatin 20 mg daily.  6. Cheratussin AC 10 mL q.6 hours. 7. DuoNebs q.6 hours as needed. 8. Fosamax 70 mg daily.  9. Prednisone taper.  10. Levaquin 500 mg daily for five days. 11. Advair 250/50, 1 puff b.i.d.  12. Spiriva 18 mcg inhaled daily.   LABORATORY, DIAGNOSTIC AND RADIOLOGICAL DATA: Chest x-ray showed no acute abnormalities. ABG showed hypoxia and hypercarbia. CMP was normal. Cardiac enzymes negative. CBC normal other than elevated white count.   HOSPITAL COURSE: Patient is a 64 year old female with past medical history of chronic respiratory failure due to chronic obstructive pulmonary disease on home O2 who presented with shortness of breath and left-sided chest pain. Patient had fallen recently and since then had been having some chest pain. She was admitted with a diagnosis of acute on chronic hypoxic hypercarbic respiratory failure due to chronic obstructive pulmonary disease exacerbation. She also had some cough with expectoration. Her chest x-ray did not show any abnormalities. She was treated with nebulizer treatments, Advair, Spiriva, steroids and empiric antibiotics with good control of her symptoms. Her  hypertension and other medical problems remained stable. She has ongoing smoking and has been counseled about cessation. She is being discharged home in a stable condition.   TIME SPENT: 45 minutes.   ____________________________ Cherre Huger, MD sp:cms D: 08/15/2011 13:31:39 ET T: 08/16/2011 12:25:03 ET JOB#: [id number]  cc: Cherre Huger, MD, <Dictator> Meindert A. Brunetta Genera, MD  Cherre Huger MD ELECTRONICALLY SIGNED 08/16/2011 13:16

## 2014-04-26 NOTE — H&P (Signed)
PATIENT NAME:  Dawn Donaldson, UPPERMAN MR#:  [id number] DATE OF BIRTH:  Apr 06, 1950  DATE OF ADMISSION:  10/15/2012  PRIMARY CARE PHYSICIAN: Dr. Jens Som, M.D.   CHIEF COMPLAINT: Shortness of breath.   HISTORY OF PRESENT ILLNESS: This is a 64 year old female who presents to the hospital with a 2 to 3 day history of shortness of breath progressively getting worse. The patient says her shortness of breath even on minimal exertion has gotten worse. The patient has underlying COPD, is oxygen dependent, but uses the oxygen intermittently. Despite using the oxygen she has been significantly short of breath. She does admit to a cough, but it is nonproductive. She denies any fevers or chills, any nausea, vomiting, chest pain, abdominal pain or any other associated symptoms. The patient presented to the ER as she was not improving, and was noted to be in mild COPD exacerbation. The patient was treated with IV steroids and some nebulizer treatments here in the ER, but continues to have significant bronchospasm and wheezing. Hospitalist services are were contacted for further treatment and evaluation.   REVIEW OF SYSTEMS:  CONSTITUTIONAL: No documented fever. No weight gain or weight loss.  EYES: No blurred or double vision.  ENT: No tinnitus. No postnasal drip. No redness of the oropharynx.  RESPIRATORY: Positive cough. Positive wheeze. No hemoptysis. Positive COPD.  CARDIOVASCULAR: No chest pain, no orthopnea, no palpitations, no syncope.  GASTROINTESTINAL: No nausea, no vomiting, no diarrhea. No abdominal pain. No melena or hematochezia.  GENITOURINARY: No dysuria or hematuria.  ENDOCRINE: No polyuria or nocturia. No heat or cold cold intolerance.  HEMATOLOGIC: No anemia, no bruising, no bleeding.  INTEGUMENTARY: No rashes. No lesions.  MUSCULOSKELETAL: No arthritis, no swelling, no gout.  NEUROLOGIC: No numbness. No tingling. No ataxia. No seizure type activity  .  PSYCHIATRIC: No anxiety, no insomnia, no  ADD.  PAST MEDICAL HISTORY:  Consistent with COPD, oxygen dependent, hypertension, hyperlipidemia, history of coronary artery disease status post stent placement, GERD.   ALLERGIES: No known drug allergies.   SOCIAL HISTORY: Does have a 20 to 25 pack-year smoking history, quit about six months ago. No alcohol abuse. No illicit drug abuse. Lives with her boyfriend.   FAMILY HISTORY: Mother is alive, does have heart problems. Father died from complications of a heart attack.   CURRENT MEDICATIONS: The patient cannot recall her current medications, but this is based off her previous admission. Lisinopril 20 mg daily DuoNebs q.6 h. as needed, Plavix 75 mg daily, Prilosec 20 mg b.i.d., simvastatin 20 mg daily, Spiriva 1 puff daily, Advair250/50 1 puff b.i.d., Ventolin inhaler 2 puffs q.4-6 hours as needed.   PHYSICAL EXAMINATION: VITAL SIGNS: Temperature is 96.3, pulse 80, respirations 18, blood pressure 136/78, sats 96% on 2 liters nasal cannula.  GENERAL: She is a pleasant -appearing female in mild respiratory distress.  HEAD, EYES, EARS, NOSE, THROAT EXAM: The patient is atraumatic, normocephalic. Extraocular muscles are intact. Pupils reactive to light. Sclerae anicteric. No conjunctival injection. No pharyngeal erythema.  NECK: Supple. There is no jugular venous distention. No bruits, no lymphadenopathy, no thyromegaly.  HEART: Regular rate and rhythm. No murmurs. No rubs, no clicks.  LUNGS: She has diffuse rhonchi and wheezing, inspiratory and expiratory. Positive use of accessory muscles. No dullness to percussion. Good air entry bilaterally.  ABDOMEN: Soft, flat, nontender, nondistended. Has good bowel sounds. No hepatosplenomegaly appreciated.  EXTREMITIES: No evidence of any cyanosis, clubbing, or peripheral edema. Has +2 pedal and radial pulses bilaterally.  NEUROLOGICAL: The patient is  alert, awake, and oriented x 3 with no focal motor or sensory appreciated bilaterally.  SKIN: Moist and  warm with no rashes appreciated.  LYMPHATIC: There is no cervical or axillary lymphadenopathy.   LABORATORY EXAM: Showed a serum glucose of 107, BUN 12, creatinine 0.7, sodium 138, potassium 4.4, chloride 103, bicarbonate 29. The patient's LFTs are within normal limits. Troponin less than 0.02. White cell count 12.5, hemoglobin 13.3, hematocrit 39.2, platelet count 251.   ASSESSMENT AND PLAN: This is a 64 year old female with a history of chronic obstructive pulmonary disease, oxygen dependent, hypertension, hyperlipidemia, history of coronary artery disease status post stent placement, gastroesophageal reflux disease, who presents to the hospital with shortness of breath and cough, progressively getting worse over the past 2 to 3 days and noted to be in chronic obstructive pulmonary disease exacerbation.  1. Chronic obstructive pulmonary disease exacerbation. The exact etiology of this is unclear, probably related to some underlying bronchitis. The patient's chest x-ray does not show any evidence of acute pneumonia. The patient has failed therapy as an outpatient. Therefore, I will admit her and start her on IV steroids, around-the-clock nebulizer treatments, continue her Advair and Spiriva, add empiric Levaquin, follow sputum and blood cultures. The patient is already on oxygen at home.  2. Hypertension: The patient is presently hemodynamically stable. Continue with her lisinopril. 3. Hyperlipidemia: Continue simvastatin.  4. History of coronary artery disease. The patient has no acute chest pain. Her first set of cardiac markers are negative. No acute ST changes on EKG. Continue with her Plavix and statin. 5. Gastroesophageal reflux disease. Continue Prilosec.   CODE STATUS: The patient is a full code.   TIME SPENT WITH ADMISSION: 50 minutes.    ____________________________ Belia Heman. Verdell Carmine, MD vjs:sg D: 10/15/2012 11:33:20 ET T: 10/15/2012 12:32:36 ET JOB#: [id number]  cc: Belia Heman. Verdell Carmine,  MD, <Dictator> Henreitta Leber MD ELECTRONICALLY SIGNED 10/15/2012 13:36

## 2014-04-26 NOTE — Discharge Summary (Signed)
PATIENT NAME:  Dawn Donaldson, Dawn Donaldson MR#:  [id number] DATE OF BIRTH:  Jun 30, 1950  DATE OF ADMISSION:  10/15/2012 DATE OF DISCHARGE:  10/18/2012  REASON FOR ADMISSION:  Shortness of breath.  DISCHARGE DIAGNOSES: 1.  Chronic obstructive pulmonary disease with exacerbation. 2.  Hypertension. 3.  Hyperlipidemia.  4.  Coronary artery disease. 5.  Gastroesophageal reflux.  6.  Leukocytosis, secondary to steroid use.   DISPOSITION:  Home.  FOLLOW UP:  Dr. Brunetta Genera in one to two weeks.  MEDICATIONS AT DISCHARGE: Simvastatin 20 mg once a day, Ventolin HFA 2 puffs 4 times a day, albuterol ipratropium nebulizer 4 times a day, Advair 250/50, 2 times daily, lisinopril 20 mg once a day, Plavix 75 mg once a day, omeprazole 20 mg two times a day, oxycodone 15 mg once a day, Plavix 75 mg daily, omeprazole 20 mg twice daily, oxycodone 15 mg 5 times daily, Combivent 1 puff 4 times daily, prednisone taper, Spiriva 18 mcg once a day, Levaquin 750 mg   HOSPITAL COURSE: A very nice, 64 year old female with history of chronic obstructive pulmonary disease, presented to the hospital with a history of 2 or 3 days with progressive shortness of breath. The patient has a history of intermittent oxygen use.  She was admitted for COPD exacerbation, treated with antibiotics. The patient was saturating around 95% to 96% on 2 liters by nasal cannula. Due to her significant wheezing and decreased air entrance, the patient was put on IV steroids, which she reacted well to.  She was started on antibiotics, Levaquin, which she also tolerated. As far as her chronic obstructive pulmonary disease, the patient was weaned off steroids, changed from IV to oral.  The patient was hemodynamically stable during this hospitalization and improved slowly, but once she was ready she was discharged home.  This patient needs to follow up with Dr. Brunetta Genera in the next week or two for evaluation of COPD exacerbation.    TIME SPENT: I spent 45 minutes on this  discharge.   ____________________________ [place] Sink, MD rsg:cg D: 10/23/2012 23:37:05 ET T: 10/24/2012 01:08:35 ET JOB#: [id number]  cc:  Sink, MD, <Dictator> Kavian Peters America Brown MD ELECTRONICALLY SIGNED 10/28/2012 22:58

## 2014-04-26 NOTE — H&P (Signed)
PATIENT NAME:  Dawn Donaldson, CLIPPARD MR#:  [id number] DATE OF BIRTH:  1950/01/21  DATE OF ADMISSION:  12/31/2012  PRIMARY CARE PHYSICIAN: Dr. Brunetta Genera.   CHIEF COMPLAINT: Increased shortness of breath.   REFERRING PHYSICIAN: Dr. Delman Kitten.   HISTORY OF PRESENT ILLNESS: This is a very nice 64 year old female who I had the pleasure to care of previously in this hospital a couple of months ago whenever she came in with another COPD exacerbation. Today, she comes, she has history of COPD, she is oxygen-dependent 2 liters nasal cannula every day, hypertension, hyperlipidemia, coronary artery disease, status post stents and GERD. The patient had a history of increased shortness of breath with wheezing for the past two days. Last night the patient had a lot of discomfort. She was not able to sleep. She needed to be upright being able to catch her breath. She had significant increase in cough and secretions. They are clear but they are increased in quantity. She says that she had a low-grade fever around 100s yesterday. She has a sick contact of her 20-year-old granddaughter who lives with her and she was being treated for strep throat. The patient has not had any signs of strep throat today. At home she uses 2 L nasal cannula of oxygen continuously, and yesterday she had to increase up to 3. The patient was brought  here in the ER department where she is evaluated by Dr. Jacqualine Code. Overall, the patient looks very agitated, tripoding. She is on oxygen actually and she was saturating on the upper 90s after increased to 3 liters from her normal 2 liters, but she was breathing really fast, is documented 26, but counting the patient breathing myself she is around 30. She is using accessory muscles and she is in severe distress, for which she has been put on BiPAP. The patient is admitted for treatment of this condition.   REVIEW OF SYSTEMS: A 12-system review of systems is done.  CONSTITUTIONAL: No weight loss or weight gain.  The patient has had a fever last night of 100 degrees.  EYES: No blurry vision, double vision.  EARS, NOSE, THROAT: No difficulty swallowing. No tinnitus. No postnasal drip.  RESPIRATORY: Positive cough. Positive wheezing. No hemoptysis. Positive COPD. The patient is a former smoker, but she quit seven months ago. The patient has been congratulated for that.   CARDIOVASCULAR: No chest pain, orthopnea, palpitations or syncope.  GASTROINTESTINAL: No nausea, vomiting, abdominal pain, constipation, diarrhea, melena or hematochezia.  GENITOURINARY: No dysuria, hematuria, or changes in frequency.  ENDOCRINE: No polyuria, polydipsia, polyphagia, cold or heat intolerance.  HEMATOLOGIC AND LYMPHATIC: No anemia, easy bruising or bleeding.  SKIN: No rashes to decrease or new lesions.  MUSCULOSKELETAL: No significant arthritis swelling or gout.  NEUROLOGIC: No numbness, tingling ataxia or seizures.  PSYCHIATRIC: No anxiety, depression or severe insomnia.   PAST MEDICAL HISTORY: 1. Chronic respiratory failure 2 L nasal cannula every day.  2. COPD. 3. Hyperlipidemia.  4. Hypertension.  5. GERD.  6. Coronary artery disease status post stent.  7. Chronic pain syndrome due to degenerative joint disease of the back. 8. Peripheral vascular disease with carotid artery stenosis.   PAST SURGICAL HISTORY: 1. Appendectomy. 2. Cholecystectomy.  3. Hysterectomy.  4. Carotid endarterectomy.  5. Two back surgeries.   ALLERGIES: No known drug allergies.   SOCIAL HISTORY: The patient used to smoke 20 to 25 pack-year, but she quit seven months ago. She does not drink alcohol. She lives with her boyfriend. They  have a granddaughter who they have adopted  as their own. She does use any drugs.   FAMILY HISTORY: Positive for stomach cancer in her sister,  coronary artery disease and congestive heart failure, in her mom, who is still alive and myocardial infarction that killed her father.   CURRENT MEDICATIONS:  The patient states that she has been taking her same medications including lisinopril 20 mg daily DuoNebs every six hours Plavix 75 mg daily, Prilosec 20 mg twice daily, simvastatin 20 mg daily, Spiriva 1 puff daily, Advair 250/50, 1 puff twice daily, Ventolin  inhaler as needed for shortness of breath, oxycodone 15 mg IR as needed for pain.   PHYSICAL EXAMINATION: VITAL SIGNS: Blood pressure 232/106, heart rate of 106, respirations 26 to 30, temperature 97.9, oxygen saturation has been recorded at 97. It says on the chart that it was on room air, but that was on 3 L. The patient uses 2 L at home. GENERAL: The patient is alert, sitting down on the chair, on the BiPAP right now. Is still breathing pretty hard, is still using accessory muscles, is still breathing fast. She is tripoding in mild respiratory distress.  HEENT: Her pupils are equal reactive. Extraocular movements are intact. Anicteric sclerae. Pink conjunctivae. No oral lesions. No oropharyngeal exudates.  NECK: Supple. No JVD. No thyromegaly. No adenopathy. She is obese. No rigidity.  CARDIOVASCULAR: Regular rate and rhythm, tachycardic. No murmurs, rubs or gallops are appreciated at this moment. No displacement of PMI.  LUNGS: The patient had significant wheezing and she is moving very little amount of air. She is using accessory muscles. She has rhonchi, and she is in a tripod position on the BiPAP. The patient in moderate to severe respiratory distress. No signs of consolidation. No dullness to percussion.  ABDOMEN: Soft, nontender, nondistended. No hepatosplenomegaly. No masses. Bowel sounds are positive. The patient is obese.  GENITAL: Deferred.  EXTREMITIES: No edema, cyanosis or clubbing.  VASCULAR: Pulses +2. Capillary refill less than 3.  MUSCULOSKELETAL: No joint effusions or joint deformity evident at this moment.  SKIN: No rashes, petechiae, or new lesions are evident.  NEUROLOGIC: Cranial nerves II through XII intact. Strength  is 5/5 in four extremities.  LYMPHATIC: Negative for lymphadenopathy in the neck or supraclavicular areas.  PSYCHIATRIC: Negative for depression, agitation or altered mental status.   RESULTS: Glucose is 113, BUN is 8, creatinine 0.76. Troponin is negative. White count is 8.1, hemoglobin is 13.4, platelet count 218. Influenza test is negative. PCO2 is 36. The patient seems to be a retainer and now she is breathing too fast and she is blowing off the CO2, pH is 7.37.   CHEST X-RAY: There is no evidence of pneumonia or congestive heart failure, possible acute bronchitis.   ASSESSMENT AND PLAN: This is a very nice 64 year old female with history of severe chronic obstructive pulmonary disease and respiratory failure, chronically.  1. Acute on chronic respiratory failure. The patient is requiring slight increase of oxygen to feel comfortable. Her oxygen saturations have been documented as above 97 whenever she is on 3 L instead of 2,  but the patient is very tachypneic, breathing up to 30 times a minute. She is clinically in respiratory distress, tripoding on the BiPAP now with very tight airways, significant wheezing and not moving much air. The patient was put on BiPAP. We are going to continue to monitor. The patient says that she will be okay if she is intubated in the case of this being a  necessity. At this moment, I think we cannot deal with the BiPAP for now, but we are going to keep her monitored closely. She is going to go to regular floor with vital signs every four and telemetry monitoring, as she has been tachycardic.   Continue pulse oximetry recommended. We are going to start the patient on antibiotics and azithromycin, that will also help chronic obstructive pulmonary disease exacerbation due to anti-inflammatory properties. The patient is going to be on steroids IV 60 mg every six hours of Solu-Medrol nebulizers, Spiriva,  and Advair. Continue to monitor for progression.   2. Hypertension.  The patient has accelerated hypertension. This is likely secondary to all her distress. Continue home medications including lisinopril and add on p.r.n. medications including hydralazine.   3. Coronary artery disease. Continue Plavix. The patient is doing okay so far, not symptomatic, but she is not on a beta blocker. She is on Plavix. The beta blocker is not given to her due to her severe chronic obstructive pulmonary disease. Continue to monitor closely.   4. Deep vein thrombosis prophylaxis with Lovenox. Gastrointestinal prophylaxis with Protonix.   I spent about 50 minutes with this patient in critical care time as she is in severe respiratory distress with respiratory failure, potential for intubation and cardiovascular collapse.   THE PATIENT IS A FULL CODE.    ____________________________ [place] Sink, MD rsg:sg D: 12/31/2012 12:28:00 ET T: 12/31/2012 13:56:42 ET JOB#: [id number]  cc: Mauldin Sink, MD, <Dictator> [place] Devine America Brown MD ELECTRONICALLY SIGNED 01/01/2013 0:59

## 2014-04-27 NOTE — Discharge Summary (Signed)
PATIENT NAME:  Dawn Donaldson, Dawn Donaldson MR#:  [id number] DATE OF BIRTH:  10-17-1950  DATE OF ADMISSION:  07/20/2013 DATE OF DISCHARGE:  07/26/2013  ADDENDUM: This is a continuation.   MEDICATIONS:  Hydrochlorothiazide with lisinopril 12.5/20 mg once a day, DuoNebs 2.5/0.5 mg 4 times daily, Nexium 40 mg once a day, Advair Diskus 250/50 twice daily, oxycodone 10 mg every 4-6 hours as needed for back pain, Plavix 75 mg once a day, Combivent inhaled 4 times daily; prednisone taper starting at 60 mg, decrease 10 mg every 2 days, Ativan 0.5 mg every 6 hours as needed for anxiety, theophylline 100 mg twice daily is a new medication, Spiriva 18  micrograms once a day, levofloxacin 750 mg every 24 hours to complete a 14-day course.   FOLLOWUP: Primary care physician, Dr. Brunetta Genera and Dr. Raul Del.   HOSPITAL COURSE: This is a very nice 64 year old female with history of severe COPD who  was admitted on 07/20/2013 with a chief complaint of shortness of breath and wheezing. The patient is chronically on 2 liters of oxygen and she stated on admission that she was having more shortness of breath and wheezing for over 1 day with significant dry cough, unable to bring up any secretions. She had to use more oxygen, up to 3 liters, rather than her normal 2 liters and was  using her inhaler very often. She was getting very short of breath with minimal ambulation.   The patient has been admitted for this problem on multiple occasions and usually has very slow progression. The circumstances were the same this time; the patient had very slow progression.  At the beginning she was started only on steroids and nebulizers due to the fact that she did not have any fever, any leukocytosis or any significant increase of secretions although since she did not progress she was then started on Zithromax on day #2 but this did not work and I decided to start her on levofloxacin when she was not getting any better.   The patient continued to have  inhalers, around-the-clock nebulizers, high-dose steroids IV and did not have any significant improvement. On July 19 the patient had an episode of increased work of breathing with significant signs of agitation, significant signs of anxiety which looked more like a panic attack. She was able to calm down after some relaxation techniques and improved quickly. Since the patient was not having significant improvement, she continued to have wheezing, I got a CT scan to rule out pulmonary embolus.  The study was negative for PE and negative for thoracic aortic dissection.  There were no signs of consolidation. There were no signs of pneumonia.  There were patchy pulmonary calcifications that were known, as the patient has a history of coronary artery disease. There was some dependent atelectasis but no pneumonia. After a couple of days of Levaquin the patient started having significant improvement.  Her secretions were looser and she was able to expectorate.   As mentioned above, the patient's COPD had a very difficult resolution. The patient was on 2 liters of oxygen by nasal cannula. She was able to get back to that amount.   The CT of the chest was done, negative for PE. The patient had a pulmonary consult by Dr. Raul Del who agreed with her treatment. The patient was started on theophylline 100 mg twice daily as her levels were low.   As far as her hypertension, this remained controlled during this hospitalization. Due to the steroids the patient  had some hyperglycemia but she does not have any history of diabetes. She was on sliding scale insulin.    The patient's leukocytosis happened after she was started on high dose of antibiotics.  She did not have leukocytosis at admission. This was just a secondary effect of the steroids.  Another secondary effect of the steroids was increase of the BUN.  She was never had an acute kidney injury.   After the patient was feeling a little bit better, despite the fact  that she was still wheezing significantly, we decided to send her home.  She was in better condition, she felt that she was back to her baseline.  Due to her panic attacks we started her on benzodiazepines and she will continue to follow up with her primary care physician for this.   I spent about 40 minutes discharging this patient.    ____________________________ [place] Sink, MD rsg:lt D: 07/30/2013 21:53:49 ET T: 07/31/2013 06:52:28 ET JOB#: [id number]  cc: Jayton Sink, MD, <Dictator> Lara Palinkas America Brown MD ELECTRONICALLY SIGNED 08/01/2013 1:21

## 2014-04-27 NOTE — H&P (Signed)
PATIENT NAME:  Dawn Donaldson, Dawn Donaldson MR#:  [id number] DATE OF BIRTH:  23-Apr-1950  DATE OF ADMISSION:  07/20/2013  PRIMARY CARE PHYSICIAN:  Meindert A. Brunetta Genera, MD.  CHIEF COMPLAINT: Shortness of breath and wheeze.   HISTORY OF PRESENT ILLNESS: This is a 64 year old female with chronic respiratory failure secondary to COPD on chronically 2 liters nasal cannula. She states that she has been short of breath and wheezing for a day with dry cough, chest pain related to the cough. She bumped up her oxygen to 3 liters and is using her inhaler every time she walks around. She has to stop every time she walks in order to catch her breath. Hospitalist services were contacted for COPD exacerbation for admission.   PAST MEDICAL HISTORY: COPD, chronic respiratory failure, coronary artery disease, hypertension, chronic back pain.   PAST SURGICAL HISTORY: Corneal implants, CAGE in her back, appendix, gallbladder, partial hysterectomy, bladder tuck, right carotid endarterectomy.   ALLERGIES: ULTRAM.   MEDICATIONS: Zofran as needed for nausea, DuoNeb nebulizer solution every 4 hours, Combivent 1 puff 4 times a day, Plavix 75 mg daily, lisinopril/hydrochlorothiazide 10/12.5 one tablet daily, oxycodone 10 mg every 4 hours, omeprazole 40 mg daily, Advair Diskus 250/50, one inhalation daily.   SOCIAL HISTORY: Quit smoking 9 months ago. No alcohol. No drug use. Is on disability.   FAMILY HISTORY: Father died of heart disease. Mother living with heart disease, breast cancer, atrial fibrillation.   REVIEW OF SYSTEMS:  CONSTITUTIONAL: Positive for weakness. No fever, chills, or sweats. No weight loss. No weight gain.  EYES:  She does wear reading glasses.  EARS, NOSE, MOUTH AND THROAT: She is deaf in the left ear. No sore throat. No difficulty swallowing.  CARDIOVASCULAR: Positive for chest pain with coughing.  RESPIRATORY: Positive for shortness of breath. Positive for cough. Positive for wheeze.  No  hemoptysis. No  sputum.  GASTROINTESTINAL: Positive for nausea. Positive for abdominal discomfort with coughing. No diarrhea. No constipation. No bright red blood per rectum. No melena.  GENITOURINARY: No burning on urination.  No hematuria.  MUSCULOSKELETAL: No joint pain or muscle pain.  INTEGUMENT: No rashes or eruptions.  NEUROLOGIC: No fainting or blackouts.  PSYCHIATRIC: No anxiety or depression.  ENDOCRINE: No thyroid problems.  HEMATOLOGIC AND LYMPHATIC: No anemia. No easy bruising or bleeding.   PHYSICAL EXAMINATION: VITAL SIGNS: On presentation to the ER included a temperature 97.9, pulse 83, respirations 20, blood pressure 205/106, pulse oximetry 98% on oxygen.  GENERAL: No respiratory distress, obese female sitting in bed. Audible wheeze heard.  EYES: Conjunctivae and lids normal. Pupils equal, round, and reactive to light. Extraocular muscles intact. No nystagmus.  EARS, NOSE, MOUTH AND THROAT: Tympanic membranes: No erythema. Nasal mucosa: No erythema. Throat: No erythema. No exudate seen. Lips and gums: No lesions.  NECK: No JVD. No bruits. No lymphadenopathy. No thyromegaly. No thyroid nodules palpated.  RESPIRATORY: Decreased breath sounds bilaterally. Positive inspiratory and expiratory wheeze. Poor air entry. No use of accessory muscles to breathe.  CARDIOVASCULAR SYSTEM: S1, S2 normal. No gallops, rubs, or murmurs heard. Carotid upstroke 2+ bilaterally. No bruits.  EXTREMITIES: Dorsalis pedis pulses 2+ bilaterally. Trace edema of the lower extremity.  ABDOMEN: Soft, nontender. No organomegaly/splenomegaly. Normoactive bowel sounds. No masses felt.  LYMPHATIC: No lymph nodes in the neck.  MUSCULOSKELETAL: No clubbing.  Trace edema. No cyanosis.  PSYCHIATRIC: The patient is alert and oriented to person, place, and time.  NEUROLOGIC: Cranial nerves II-XII grossly intact. Deep tendon reflexes 1+ bilateral lower  extremity.  SKIN: No ulcers or lesions seen.   LABORATORY AND RADIOLOGICAL DATA:  Venous pH 7.39.   Chest x-ray: No acute abnormality.   White blood cell count 11.4, H and H 12.8 and 39.8, platelet count 245,000. Troponin negative. Glucose 94, BUN 15, creatinine 0.8, sodium 139, potassium 4.3, chloride 106, CO2 of 27.   EKG: Sinus rhythm, 86 beats per minute.   ASSESSMENT AND PLAN: 1.  Chronic obstructive pulmonary disease exacerbation. IV Solu-Medrol 125 mg given in the Emergency Room. I will continue 60 mg IV q. 6 hours. I will add Spiriva. We will continue DuoNeb nebulizer solution. Will give budesonide nebulizers also.  2.  Chronic respiratory failure. Continue oxygen supplementation. No taper.  3.  Coronary artery disease. No symptoms on Plavix.  4.  Accelerated hypertension on presentation, likely secondary to not being able to breathe well. Current blood pressure 152/72. Continue usual medications.  5.  Chronic back pain on oxycodone.  6.  Obesity. BMI of 40.6. Weight loss needed.   TIME SPENT ON ADMISSION: 55 minutes.   CODE STATUS: The patient is a full code.   ____________________________ Tana Conch. Leslye Peer, MD rjw:ds D: 07/20/2013 71:85:50 ET T: 07/20/2013 21:14:24 ET JOB#: [id number]  cc: Tana Conch. Leslye Peer, MD, <Dictator> Meindert A. Brunetta Genera, [place] MD ELECTRONICALLY SIGNED 07/31/2013 16:18

## 2014-04-27 NOTE — Discharge Summary (Signed)
PATIENT NAME:  Dawn Donaldson, Dawn Donaldson MR#:  [id number] DATE OF BIRTH:  [birth year]  DATE OF ADMISSION:  12/31/2012 DATE OF DISCHARGE:  01/04/2013  ADMITTING DIAGNOSIS:  Chronic obstructive pulmonary disease exacerbation.   DISCHARGE DIAGNOSES: 1.  Acute on chronic respiratory failure.  2.  Chronic obstructive pulmonary disease exacerbation.  3.  Acute bronchitis.  4.  Lower extremity edema and pain. Doppler ultrasound is negative for deep vein thrombosis.  5. History of hypertension, hyperlipidemia, gastroesophageal reflux disease, chronic pain syndrome, peripheral vascular disease.   DISCHARGE CONDITION: Stable.   DISCHARGE MEDICATIONS: The patient is to resume simvastatin 10 mg p.o. daily, Ventolin HFA 2 puffs 4 times daily as needed, albuterol/ipratropium 2.5/0.5 mg in 3 mL inhalation solution, 3 mL 4 times daily as needed; fluticasone/formoterol 250/50, 1 puff twice daily; Plavix 75 mg p.o. daily, omeprazole 20 mg p.o. twice daily, oxycodone 15 mg p.o. 5 times daily as needed, Combivent 100/20, 1 puff 4 times daily; prednisone taper 60 mg p.o. once on 01/05/2013, then taper by 10 mg every 2 days until stopped; doxycycline 100 mg p.o. twice daily for 7 more days, lisinopril 40 mg p.o. twice daily, tiotropium 18 mcg inhalation daily, amlodipine 5 mg p.o. daily,  senna 1 tablet once daily as needed, docusate sodium 1 capsule twice daily as needed.   The patient is going to be discharged home with home health physical therapy, as well as nurse.   OXYGEN:  She is going to be discharged with portable tank with 2 liters of oxygen through nasal cannula.   DIET: Two-gram salt, low fat, low cholesterol, regular consistency.   ACTIVITY LIMITATIONS: As tolerated.     FOLLOWUP APPOINTMENT:  With Dr. Brunetta Genera in 2 days after discharge.    CONSULTANTS: Care management, social work.   RADIOLOGIC STUDIES: Chest x-ray, portable single view, 12/31/2012, showed no evidence of pneumonia or CHF or other acute  cardiopulmonary abnormality. Cannot exclude acute bronchitis in the appropriate clinical setting, according to radiologist. Doppler ultrasounds of bilateral lower extremities, 01/04/2013, revealed no evidence of deep vein thrombosis in either of the lower extremities.  Echocardiogram, 01/04/2013, showed left ventricular ejection fraction by visual estimation of 60% to 65%, normal global left ventricular systolic function, mild mitral valve regurgitation. Aortic valve was structurally normal. No evidence of sclerosis or stenosis present, as well as systolic pressure was normal at 21.7 mmHg.   The patient is a 64 year old Caucasian female with past medical history significant for history of chronic respiratory failure on oxygen therapy at home, who presented to the hospital with complaints of shortness of breath, as well as wheezing. Please refer to Dr. Laurin Coder Gutierrez's admission note on 12/31/2012. On arrival to the hospital, the patient's O2 sats were low on 3 liters of oxygen through nasal cannula. She was also tachypneic and using accessory muscles. She was placed on BiPAP in the Emergency Room. On physical exam, revealed significant wheezing and  poor air movement in lungs. She also was using her accessory muscles, as mentioned above, and had rhonchi. She was in moderate to severe respiratory distress. The patient's lab data done in the Emergency Room showed elevation of glucose to 113, otherwise BMP was normal. The patient's troponin was less than 0.02. CBC was within normal limits. Influenza test was negative. Sputum cultures were taken on 01/02/2013, and showed just normal flora. ABGs were done on nasal cannula, showed pH of 7.34, pCO2 was 40, pO2 was 93, and saturation was 98.8% on 28% FiO2. The patient's EKG showed  normal sinus rhythm at 87 beats per minute, normal axis. No acute ST-T changes were noted. Chest x-ray was concerning for possible bronchitis, as well as COPD.  The patient was admitted to  the hospital for further evaluation. She was initiated on steroids, inhalation therapy, nebulizers, as well as antibiotics. With this, her condition slowly improved. Her wheezing decreased, as well as air entrance improved. On the day of discharge, 01/04/2013, she felt satisfactory, and her oxygenation was stable on her usual 2 liters of oxygen through nasal cannula. Her vital signs were temperature 98.0, pulse was 70s to 80s, respiration rate was 18, blood pressure 468 to 032 systolic and 12Y to 48G diastolic. O2 sats were 96% to 97% on 2 liters of oxygen through nasal cannula at rest.  It was felt that the patient is stable to be discharged home; however, since her condition was not yet perfect and she was having somewhat increased expiratory phase, it was felt that the patient would be best served to continue antibiotics, as well as slow steroid taper and nebulizing therapy. She is to follow up with Dr. Brunetta Genera in the next few days after discharge and make decisions about her therapy. In regards to lower extremity problems, the patient was complaining of some lower extremity pain. She told me that this pain seems to be different than the pain she usually experiences, and since she had mild swelling, she  underwent Doppler ultrasound testing, which was negative for DVT.  I felt the patient's pain could have been related to peripheral vascular disease, as well as her lower back pain; however, she was advised to continue to follow up with her primary care physician and make decisions about further investigation of her lower extremity discomfort. In regards to hypertension, hyperlipidemia, gastroesophageal reflux disease vascular disease, peripheral vascular disease, chronic pain syndrome, the patient is to continue her outpatient management. The patient is being discharged in stable condition with the above-mentioned medications and followup.   TIME SPENT: 40 minutes.  ____________________________ Theodoro Grist, MD rv:dmm D: 01/04/2013 20:07:00 ET T: 01/04/2013 20:34:13 ET JOB#: [id number]  cc: Theodoro Grist, MD, <Dictator> Meinder[place] A. Brunetta Genera, MD Theodoro Grist MD ELECTRONICALLY SIGNED 01/18/2013 19:11

## 2014-04-27 NOTE — Discharge Summary (Signed)
PATIENT NAME:  Dawn, Donaldson MR#:  [id number] DATE OF BIRTH:  1950-12-10  DATE OF ADMISSION:  02/16/2013 DATE OF DISCHARGE:  02/19/2013  DISCHARGE DIAGNOSES:  1.  Acute-on-chronic respiratory failure.  2.  Acute chronic obstructive pulmonary disease exacerbation.  3.  Vaginal bleeding.   IMAGING STUDIES DONE: Include a chest x-ray which showed no acute abnormalities.   ADMITTING HISTORY AND PHYSICAL AND HOSPITAL COURSE: Please see detailed H and P dictated previously. In brief, a 65 year old female patient with history of COPD presented to the hospital complaining of worsening shortness of breath and was found to be severely wheezing, needing oxygen and was admitted to the hospitalist service. The patient was started on IV steroids, nebulizers, and antibiotics with which she improved well over the 48 hours of stay. The patient has improved well. She is saturating 94% on room air and has ambulated without any problems and feels back to baseline and is being discharged back home to follow up with her pulmonologist, Dr. Raul Del.   On examination, the patient has mild wheezing which she mentions is a chronic issue with her.   Today she had mild vaginal spotting which is a new symptom for the patient. No frank bleeding. 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 After getting the history, the patient does not have any acute abnormality. I have advised her to follow up with her gynecologist, stressed the importance of following up as this could be a recurrence of the dysplasia or even cancer. The patient has been advised to return to the Emergency Room if she has any further frank bleeding. Presently has only minimal spotting.   DISCHARGE MEDICATIONS:  Include:  1.  Cardizem 180 mg daily.  2.  Zocor 20 mg daily.  3.  Zofran 4 mg every 8 hours as needed for nausea or vomiting.  4.  Lasix 40 mg daily.  5.  DuoNeb 3 mL inhaled every 4 hours as needed for shortness of breath.  6.   Combivent 1 puff four times a day as needed.  7.  Vantin HFA 2 puffs every 4 hours as needed.  8.  Plavix 75 mg daily.  9.  Hydrochlorothiazide lisinopril 12.5/20, one tablet oral once a day.  10.  Oxycodone 10 mg oral every 4 to six hours as needed for pain.  11.  Omeprazole 10 mg daily.  12.  Prednisone 60 mg tapered over 6 days.  13.  Erythromycin 500 mg oral once a day for 5 days.   DISCHARGE INSTRUCTIONS: Continue home oxygen at 2 liters. Low-sodium diet. Activity as tolerated.   FOLLOWUP: Dr. Raul Del and primary care physician in 1 to 2 weeks. The patient has been given an appointment to follow up with gynecology at [facility] clinic for her vaginal bleeding.   TIME SPENT ON DAY OF DISCHARGE IN DISCHARGE ACTIVITY: 40 minutes.     ____________________________ [name], MD srs:np D: 02/19/2013 14:16:10 ET T: 02/19/2013 14:56:27 ET JOB#: [id number]  cc: Alveta Heimlich R. Dannis Deroche, MD, <Dictator> [facility] Herbon E. Raul Del, MD Neita Carp MD ELECTRONICALLY SIGNED 02/22/2013 14:12

## 2014-04-27 NOTE — Discharge Summary (Signed)
PATIENT NAME:  Dawn Donaldson, Dawn Donaldson MR#:  [id number] DATE OF BIRTH:  November 24, 1950  DATE OF ADMISSION:  06/02/2013 DATE OF DISCHARGE:  06/05/2013  ADMISSION DIAGNOSIS:  Chronic obstructive pulmonary disease exacerbation.  DISCHARGE DIAGNOSES: 1.  Acute on chronic respiratory failure due to chronic obstructive pulmonary disease exacerbation.  2.  Acute bronchitis.  3.  Hypertension.  4.  Hyperglycemia with known diabetes.  5.  History of chronic obstructive pulmonary disease.  6.  Hypertension.  7.  Coronary artery disease, status post stent placement in the past.  8.  History of chronic back pain.  9.  Gastroesophageal reflux disease.   DISCHARGE CONDITION:  Stable.  DISCHARGE MEDICATIONS:   1.  The patient is to continue Zofran 4 mg every 8 hours as needed.  2.  DuoNebs 2.5/0.5 mg in 3 mL inhalation solution every 4 hours as needed.  3.  Combivent inhalation as needed.  4.  Ventolin 1 puff as needed.  5.  Plavix 75 mg per day.  6.  HCTZ/lisinopril 12.5/10 mg once daily.  7.  Oxycodone 10 mg every 4 to 6 hours as needed.  8.  Omeprazole 40 mg p.o. daily.  9.  Advair discus 250/50 one puff twice daily.  10.  Levaquin 500 mg p.o. once daily for 5 more days.  11.  Prednisone 50 mg p.o. once on the 3rd of June 2015, then taper by 10 mg every 2 days.   HOME OXYGEN: With portable tank at 2 liters of oxygen through nasal cannula.   DIET: Two grams salt, low fat, low cholesterol, regular consistency.   ACTIVITY LIMITATIONS: As tolerated. Follow-up.   FOLLOWUP APPOINTMENT: With Dr. Brunetta Genera in 2 days after discharge.   CONSULTANTS: Care management, social work.   RADIOLOGIC STUDIES: Chest x-ray, portable, single view, Jun 02, 2013, revealing  emphysema without edema or consolidation.   HISTORY OF PRESENT ILLNESS:  The patient is a 64 year old Caucasian female with past medical history significant for history of chronic obstructive pulmonary disease, who is oxygen-dependent with oxygen at  nighttime, who presents to the hospital with complaints of shortness of breath as well as cough. Please refer to Dr. Edward Jolly admission note on Jun 02, 2013. On arrival to the Emergency Room, patient's temperature was 98.1, pulse was 84, respiratory rate was 22, blood pressure 172/80, saturation was 99% on 2 liters of oxygen through nasal cannula. She was in moderate distress. Her physical exam revealed inspiratory as well as expiratory wheezing and accessory muscle use but no dullness to percussion. The patient's lab data done on arrival to the Emergency Room revealed normal BMP on Jun 02, 2013, normal troponin less than 0.02. That same day, elevated white blood cell count to 12.7, hemoglobin 12.5, platelet count 227. EKG showed poor data quality. interpretation may be adversely affected, normal sinus rhythm at 85 beats per minute, normal axis and no acute ST-T changes were noted. Chest x-ray as mentioned above, was unremarkable.   HOSPITAL COURSE:  The patient was admitted to the hospital with diagnosis of chronic obstructive pulmonary disease exacerbation and acute bronchitis. She was initiated on IV antibiotics as well as steroids IV as well as nebulizing therapy and inhalers. With this, initially her condition improved significantly. By the day of discharge, June 05, 2013, her breathing became much more comfortable. Her lungs sounded still a little wheezy; however, no tightness was noted and air entrance was equal and good bilaterally. The patient was advised to continue a steroid taper as well as antibiotic  therapy and inhalation therapy. She was advised also to continue oxygen therapy since her oxygen saturations were 93% on room air at rest, however, it improved to 95% at 2 liters of oxygen. The patient was advised to wean off oxygen as tolerated. She is to follow up with her primary care physician for further recommendations in regards to management of her chronic obstructive pulmonary disease. In regards  to acute bronchitis, we attempted to get cultures; however, no cultures were obtained. However, since the patient clinically improved, it was felt that she is to continue some antibiotics which was Levaquin. She was advised to continue Levaquin to complete a 7-day course. In regards to hypertension, chronic obstructive pulmonary disease, coronary artery disease, chronic back pain, gastroesophageal reflux disease, no changes were made in medication management. On the day of discharge, June 05, 2013, the patient's vital signs:  Temperature was 98.4, pulse was ranging from 70s to 80s, respiratory rate was 18, blood pressure 223 to 361 systolic and 22E to 49P diastolic. Oxygen saturations were 93% on room air at rest and 95% on 2 liters of oxygen through nasal cannula at rest.   TIME SPENT: 40 minutes.   ____________________________ Theodoro Grist, MD rv:cs D: 06/05/2013 19:01:00 ET T: 06/05/2013 20:00:52 ET JOB#: [id number]  cc: Meindert A. Brunetta Genera, MD Theodoro Grist, MD, <Dictator>  [place] MD ELECTRONICALLY SIGNED 06/19/2013 7:32

## 2014-04-27 NOTE — H&P (Signed)
PATIENT NAME:  Dawn Donaldson, Dawn Donaldson MR#:  [id number] DATE OF BIRTH:  11/07/1950  DATE OF ADMISSION:  02/16/2013  REFERRING PHYSICIAN:  Dr. Joni Fears  PRIMARY CARE PHYSICIAN: Dr. Brunetta Genera  CHIEF COMPLAINT: Shortness of breath.  HISTORY OF PRESENT ILLNESS:  A 64 year old Caucasian female with history of COPD, on 2 liters nasal cannula at nighttime, presenting with shortness of breath. She describes one day duration of shortness of breath with associated cough, which she describes as nonproductive. Denies any fevers, chills, chest pain, sick contacts, palpitations, lower extremity edema, orthopnea, or PND. She does mention having audible wheezing. Denies any recent travel, immobilization, lower extremity edema, or leg pain. On arrival, she was found to be hypoxemic, requiring 2 liters supplemental O2 to maintain O2 saturation. She has received DuoNeb therapies as well as Solu-Medrol. She is improved, but still symptomatic.   REVIEW OF SYSTEMS:    CONSTITUTIONAL: Denies fever, fatigue, weakness, pain.  EYES: Denies blurred vision, double vision, eye pain. EARS:  Denies tinnitus, ear pain, hearing loss.  RESPIRATORY: Positive for cough, wheeze, shortness of breath. CARDIOVASCULAR:  Denies chest pain, palpitations, edema.  GASTROINTESTINAL:  Denies nausea, vomiting, diarrhea, abdominal pain. GENITOURINARY:  Denies history of hematuria. ENDOCRINE:  Denies nocturia or thyroid problems. HEMATOLOGIC:  Denies easy bruising or bleeding. SKIN:  Denies rash or lesion.  MUSCULOSKELETAL: Denies pain in neck, back, shoulder, knees, hips, or arthritis symptoms.  NEUROLOGIC:  Denies paralysis, paresthesias. PSYCHIATRIC:  Denies anxiety or depressive symptoms.  Otherwise, review of systems was performed and is negative.   PAST MEDICAL HISTORY: COPD, on 2 liters nasal cannula at bedtime, hyperlipidemia, hypertension, gastroesophageal reflux disease, coronary artery disease, status post PCI with stenting, peripheral  vascular disease.   SOCIAL HISTORY: Remote tobacco abuse. Denies any alcohol or drug usage.   FAMILY HISTORY: Positive for coronary artery disease.   ALLERGIES: ULTRAM.   HOME MEDICATIONS: Include Cardizem 180 mg p.o. daily, Zofran 4 mg p.o. q. 8 hours as needed for nausea/vomiting, Zocor 20 mg p.o. at bedtime, hydrochlorothiazide/lisinopril 12.5/20 mg p.o. daily, Plavix 75 mg p.o. daily, Combivent 100/20 mcg inhalation which she uses on p.r.n. basis, Ventolin 90 mcg inhalation 1 puff every 4 hours as needed for shortness of breath, Lasix 40 mg p.o. daily, Nexium 40 mg p.o. daily.   PHYSICAL EXAMINATION: VITAL SIGNS: Temperature 98.6, heart rate 110, respirations 26, blood pressure 151/89, saturating 92% on 2 liters nasal cannula. Weight 97.5 kg, BMI 40.6.  GENERAL:  A well-nourished, well-developed, obese Caucasian female, currently in minimal to moderate distress secondary to respiratory status.  HEAD: Normocephalic, atraumatic.  EYES: Pupils equal, round, reactive to light. Extraocular muscles intact. No scleral icterus. MOUTH: Moist mucous membranes. Dentition intact. No abscess noted.  EARS, NOSE, THROAT:  (Dictation Anomaly) <<MISSING TEXT> exudates. No external lesions. NECK: Supple. No thyromegaly. No nodules. No JVD.  PULMONARY: Audible expiratory wheezing. Prolonged expiratory phase. Greatly diminished breath sounds throughout all lung fields. No use of accessory muscles. However, tachypneic. Good respiratory effort.  CHEST: Nontender to palpation.  CARDIOVASCULAR: S1, S2. Tachycardic. No murmurs, rubs, or gallops. No edema. Pedal pulses 2+ bilaterally.  GASTROINTESTINAL:  Soft, nontender, nondistended. No masses. Positive bowel sounds. No hepatosplenomegaly. Obese.   MUSCULOSKELETAL: No swelling, clubbing, edema. Range of motion full in all extremities.  NEUROLOGIC: Cranial nerves II through XII intact. No gross focal neurologic deficits. Sensation intact. Reflexes intact.  SKIN:  No ulcerations, lesions, rashes, cyanosis. Skin warm, dry. Turgor intact.  PSYCHIATRIC: Mood and affect within normal limits. The  patient awake, alert, oriented x 3. Insight and judgment intact.   LABORATORY DATA: EKG performed, revealing normal sinus rhythm, heart rate 98, no ST or T-wave abnormalities. Chest x-ray performed, revealing hyperinflated lung fields, no acute cardiopulmonary process. Sodium 140, potassium 3.7, chloride 106, bicarb 25, BUN 9, creatinine 0.85, glucose 116. LFTs within normal limits. Troponin I less than 0.02. WBC 7.7, hemoglobin 13, platelets 190. ABG performed, 7.4/39/103/24.2 on 2 liters nasal cannula.   ASSESSMENT AND PLAN: A 64 year old Caucasian female with history of chronic obstructive pulmonary disease on 2 liters nasal cannula at baseline, presenting with shortness of breath.  1.  COPD exacerbation. Supplemental O2 to keep SaO2 greater than 90%. DuoNeb therapy q. 4 hours, Solu-Medrol 60 mg IV daily. Provide flutter valve therapy. Azithromycin, as she does have cough. If she shows no signs of improvement in the next 24 to 48 hours, would recommend getting a CT of her chest, though PE is extremely unlikely. WELLs  score of zero, and she has already made some improvement after DuoNeb therapy in the Emergency Department.   2.  Coronary artery disease. Continue aspirin and Plavix.  3.  Type 2 diabetes. Add insulin sliding scale and q. 6 hour Accu-Cheks. She is on no other medications for diabetes.   4.  Hypertension. Continue hydrochlorothiazide, lisinopril and Cardizem.   5.  Venous thrombosis prophylaxis with heparin subcu.  The patient is FULL CODE.   TIME SPENT: 45 minutes.    ____________________________ Aaron Mose. Ashia Dehner, MD dkh:mr D: 02/16/2013 22:28:39 ET T: 02/16/2013 22:43:05 ET JOB#: [id number]  cc: Aaron Mose. Bana Borgmeyer, MD, <Dictator> [place] Peddie Woodfin Ganja MD ELECTRONICALLY SIGNED 02/16/2013 23:39

## 2014-04-27 NOTE — H&P (Signed)
PATIENT NAME:  Dawn Donaldson, Dawn Donaldson MR#:  [id number] DATE OF BIRTH:  1950/03/30  DATE OF ADMISSION:  06/02/2013  PRIMARY CARE PHYSICIAN: Meindert A. Brunetta Genera, MD   CHIEF COMPLAINT: Shortness of breath and cough.   HISTORY OF PRESENT ILLNESS: This is a 64 year old female who presents to the hospital with a 2- to 3-day history of shortness of breath and cough, progressively getting worse. The patient has a history of underlying COPD and is already on oxygen at home. She says that despite using her nebulizer treatments and her oxygen, she was having significant shortness of breath, even on minimal exertion. She was recently seen by her cardiologist and put on a prednisone taper about a few weeks back. She has finished the prednisone taper, and she was feeling better, but then her symptoms have recurred. She came to the ER for further evaluation and was noted to have significant respiratory distress secondary to COPD exacerbation. Hospitalist services were contacted for further treatment and evaluation. The patient admits to a cough which is nonproductive. She admits to some chest tightness. No nausea. No vomiting. No abdominal pain. No diarrhea. No sick contacts. No other upper respiratory symptoms.   REVIEW OF SYSTEMS:  CONSTITUTIONAL: No documented fever. No weight gain, no weight loss.  EYES: No blurry or double vision.  ENT: No tinnitus. No postnasal drip. No redness of the oropharynx.  RESPIRATORY: Positive cough. Positive wheeze. No hemoptysis. Positive dyspnea. Positive COPD.  CARDIOVASCULAR: No chest pain, no orthopnea, no palpitations, no syncope.  GASTROINTESTINAL: No nausea. No vomiting. No diarrhea. No abdominal pain. No melena or hematochezia.  GENITOURINARY: No dysuria, no hematuria.  ENDOCRINE: No polyuria or nocturia, heat or cold intolerance.  HEMATOLOGIC: No anemia, no bruising, no bleeding.  INTEGUMENTARY: No rashes. No lesions.  MUSCULOSKELETAL: No arthritis. No swelling. No gout.   NEUROLOGIC: No numbness. No tingling. No ataxia. No seizure activity.  PSYCHIATRIC: No anxiety. No insomnia. No ADD.   PAST MEDICAL HISTORY: Consistent with:  1. COPD.  2. Hypertension.  3. History of coronary artery disease, status post stent placement.  4. Chronic back pain. 5. GERD.   ALLERGIES: ULTRAM.   SOCIAL HISTORY: Does have a 30- to 40-pack-year smoking history, quit about 9 months ago. No alcohol abuse. No illicit drug abuse. Lives at home with her husband.   FAMILY HISTORY: The patient's mother is alive. She has heart disease with congestive heart failure and atrial fibrillation. Father is deceased from complications of heart disease.   CURRENT MEDICATIONS: As follows:  1. Combivent 100/20 as needed.  2. DuoNebs every 4 hours as needed. 3. Nexium 40 mg daily. 4. Hydrochlorothiazide/lisinopril 12.5/20 one tab daily.  5. Oxycodone 10 mg q.4 hours as needed.  6. Plavix 75 mg daily.  7. Albuterol inhaler q.4 hours as needed. 8. Zofran 4 mg q.8 hours as needed.   PHYSICAL EXAMINATION: Presently, is as follows:  VITAL SIGNS: Noted to be: Temperature is 98.1, pulse 84, respirations 22, blood pressure 172/80, saturation is 99% on 2 liters nasal cannula.  GENERAL: She is a pleasant-appearing female in mild respiratory distress.  HEAD, EYES, EARS, NOSE AND THROAT: Atraumatic, normocephalic. Extraocular muscles are intact. Pupils are equally reactive to light. Sclerae are anicteric. No conjunctival injection. No pharyngeal erythema.  NECK: Supple. There is no jugular venous distention. No bruits, no lymphadenopathy, no thyromegaly.  HEART: Regular rate and rhythm. No murmurs. No rubs. No clicks.  LUNGS: She has diffuse inspiratory and expiratory wheezing. Positive use of accessory muscles. No  dullness to percussion.  ABDOMEN: Soft, flat, nontender, nondistended. Has good bowel sounds. No hepatosplenomegaly appreciated.  EXTREMITIES: No evidence of any cyanosis, clubbing or  peripheral edema. Has +2 pedal and radial pulses bilaterally.  NEUROLOGICAL: The patient is alert, awake and oriented x3, with no focal motor or sensory deficits appreciated bilaterally.  SKIN: Moist and warm, with no rashes appreciated.  LYMPHATIC: There is no cervical or axillary lymphadenopathy.   LABORATORY EXAMINATION: Showed a serum glucose of 94, BUN 15, creatinine 0.8, sodium 141, potassium 3.7, chloride 110, bicarbonate 26. Troponin less than 0.02. White cell count 12.7, hemoglobin 12.5, hematocrit 38.3, platelet count 227.   IMAGING: The patient did have a chest x-ray done which showed no evidence of any acute pneumonia, but emphysema, without edema or consolidation.   ASSESSMENT AND PLAN: This is a 64 year old female with a history of chronic obstructive pulmonary disease, on home oxygen, hypertension, history of coronary artery disease, chronic back pain and gastroesophageal reflux disease, who presents to the hospital with shortness of breath and cough and noted to be in chronic obstructive pulmonary disease exacerbation.   1. Chronic obstructive pulmonary disease exacerbation. This is likely suspected due to underlying bronchitis. The patient has no evidence of any acute pneumonia on the chest x-ray. I will empirically start her on IV steroids. Continue around-the-clock nebulizer treatments. Also start some Advair and Spiriva. Even though her chest x-ray is negative for pneumonia, I will empirically start her on Levaquin. Check sputum cultures. The patient is already on oxygen at home. Follow her clinically.  2. Hypertension, presently hemodynamically stable. Continue with her lisinopril/hydrochlorothiazide.  3. Chronic back pain. Continue oxycodone.  4. Gastroesophageal reflux disease. Continue Protonix.  5. History of coronary artery disease. Continue with her Plavix for now.   CODE STATUS: The patient is a full code.   TIME SPENT ON ADMISSION: 45 minutes.    ____________________________ Belia Heman. Verdell Carmine, MD vjs:lb D: 06/02/2013 09:21:06 ET T: 06/02/2013 09:50:16 ET JOB#: [id number]  cc: Belia Heman. Verdell Carmine, MD, <Dictator> Henreitta Leber MD ELECTRONICALLY SIGNED 06/09/2013 21:51

## 2014-04-28 NOTE — H&P (Signed)
PATIENT NAME:  Dawn Donaldson, Dawn Donaldson MR#:  [id number] DATE OF BIRTH:  03/25/1950  DATE OF ADMISSION:  03/29/2011  PRIMARY CARE PHYSICIAN:  Dr. Brunetta Genera   CHIEF COMPLAINT: Increased shortness of breath and wheezing.   HISTORY OF PRESENT ILLNESS: Dawn Donaldson is a 64 year old Caucasian female with history of chronic obstructive pulmonary disease and chronic tobacco abuse. She has a history of chronic respiratory failure, home oxygen dependent on 2 liters at night. It is unfortunate that the patient continues to smoke. She presented to the Emergency Room with two-day history of increased shortness of breath associated with wheezing and productive cough greenish in color associated with fever and chills. Her symptoms worsened over the last 24 hours. Evaluation here in the Emergency Department is consistent with chronic obstructive pulmonary disease exacerbation. She is suspected to have pneumonia since she has elevated white count reaching 20,000 and she has fever and chills; however, her chest x-ray did not support that. Additionally, her blood work  here reveals elevated blood sugar reaching 200. The patient is not known to have diabetes mellitus. The patient was admitted for further evaluation and management.     REVIEW OF SYSTEMS: CONSTITUTIONAL: Admits having fever and chills. No night sweats. No fatigue. EYES: Denies any blurring of vision. No double vision. 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 This is an old finding. No sore throat. No dysphagia. No epistaxis. CARDIOVASCULAR: No chest pain. Admits having shortness of breath. No edema. No syncope. RESPIRATORY: Admits having cough, sputum production, wheezing, and shortness of breath.  GASTROINTESTINAL: No nausea, no vomiting, no abdominal pain, no diarrhea. GENITOURINARY: No dysuria or frequency of urination. No vaginal bleed. MUSCULOSKELETAL: No joint pain or swelling. No muscular pain or swelling.  INTEGUMENT: No skin rash. No ulcers.  NEUROLOGY: No focal weakness. No seizure activity. No headache. PSYCHIATRY: No anxiety. No depression. ENDOCRINE: No night sweats. No polyuria or polydipsia. No heat or cold intolerance.   PAST MEDICAL HISTORY:  1. History of chronic obstructive pulmonary disease.  2. History of chronic respiratory failure, home oxygen dependent on 2 liters at night.  3. History of systemic hypertension.  4. History of gastroesophageal reflux disease.  5. History of coronary artery disease, status post stent implant.  6. History of peripheral vascular disease and right carotid stenosis status post right carotid endarterectomy.  7. History of right ear deafness.   PAST SURGICAL HISTORY:  1. History of right carotid endarterectomy.  2. History of stent implants for coronary artery disease.   FAMILY HISTORY: Her father died at the age of 73 from a heart attack. She has a brother who had a heart attack in his 44s. She has another brother who is younger than her; he underwent coronary artery bypass graft. He has also diabetes mellitus.   SOCIAL HISTORY: The patient is unemployed. She lives on disability. She lives with her boyfriend.   SOCIAL HABITS: Chronic smoker of 1 pack per day since the age of 68. No history of alcoholism or other drug abuse.   ADMISSION MEDICATIONS:  1. Combivent inhaler p.r.n.  2. Albuterol nebulization p.r.n.  3. Ventolin inhaler p.r.n.  4. Simvastatin 20 mg a day.  5. Prilosec 20 mg a day.  6. Plavix 75 mg a day.  7. Lisinopril/hydrochlorothiazide 20 mg/12.5 mg once a day.  8. Fosamax 70 mg once a week.   ALLERGIES: No known drug allergies. She has history of intolerance to Ultram causing nausea. This is not a true allergy.  PHYSICAL EXAMINATION:  VITAL SIGNS: Blood pressure was 109/61, respiratory rate 24, temperature 99, oxygen saturation 93%; this is on oxygen. Without oxygen she was hypoxemic earlier.   GENERAL APPEARANCE: Middle-aged female lying in bed. It appears that  she is in mild to moderate respiratory difficulty.   HEAD/NECK: No pallor. No icterus. No cyanosis.   ENT: Hearing impairment in the right ear. Nasal mucosa, lips, and tongue were normal.   EYES: Normal iris and conjunctivae. Pupils about 4 mm and sluggishly reactive to light.   NECK: Supple. Trachea at midline. No thyromegaly. No cervical lymphadenopathy. No masses. There is scar tissue on the right side of the neck consistent with her previous carotid surgery.   HEART: Normal S1, S2. No S3, S4. No murmur. No gallop. No carotid bruits.   LUNGS: Slight tachypnea. Diffuse rhonchi, prolonged expiratory phase, and bilateral wheezing. No rales.   ABDOMEN: Soft without tenderness. No hepatosplenomegaly. No masses. No hernias.   SKIN: No ulcers. No subcutaneous nodules.   MUSCULOSKELETAL: No joint swelling. No clubbing.   NEUROLOGIC: Cranial nerves II through XII are intact. No focal motor deficit.   PSYCHIATRY: The patient is alert and oriented times three. Mood and affect were normal.   LABORATORY, DIAGNOSTIC, AND RADIOLOGICAL DATA: Chest x-ray showed heart size normal. No consolidation, no effusion. EKG showed sinus tachycardia at rate of 104 per minute, incomplete right bundle branch block. Otherwise unremarkable EKG. Serum glucose was 204, BUN 13, creatinine 0.8, sodium 141, potassium 3.6. Liver function tests were normal. CPK 45, troponin less than 0.02. CBC showed white count of 18,000, hemoglobin 13, hematocrit 41, platelet count 219.   ASSESSMENT:  1. Chronic obstructive pulmonary disease exacerbation.  2. Chronic respiratory failure, home oxygen dependent on 2 liters at night.  3. Elevated blood sugar indicating underlying diabetes mellitus. The patient is not known to have diabetes.  4. Systemic hypertension.  5. History of coronary disease status post stent implant.  6. Tobacco abuse.  7. Peripheral vascular disease status post right carotid endarterectomy.  8. History of  gastroesophageal reflux disease.   PLAN: Admit the patient to the medical floor. Start DuoNebs q. 4-6 hours along with a small dose of IV Solu-Medrol 40 mg q. 8 hours to avoid escalating hyperglycemia. IV antibiotics using Rocephin and Zithromax were initiated. Blood cultures were taken. Accu-Cheks and sliding scale with insulin. Dietary consultation for diabetic education. Continue home medications as listed above. For deep vein thrombosis prophylaxis, I placed her on Lovenox 40 mg subcutaneously once a day. The patient needs to quit smoking indefinitely. I placed her on a nicotine patch 21 mg a day. Continue oxygen supplementation at 2 liters.   TIME SPENT EVALUATING THIS PATIENT: More than 55 minutes.    ____________________________ Clovis Pu. Lenore Manner, MD amd:bjt D: 03/29/2011 00:54:36 ET T: 03/29/2011 07:50:59 ET JOB#: [id number]  cc: Clovis Pu. Lenore Manner, MD, <Dictator> Meindert A. Brunetta Genera, MD Ellin Saba MD ELECTRONICALLY SIGNED 03/30/2011 22:49

## 2014-04-28 NOTE — Discharge Summary (Signed)
PATIENT NAME:  Dawn Donaldson, Dawn Donaldson MR#:  [id number] DATE OF BIRTH:  Nov 06, 1950  DATE OF ADMISSION:  03/29/2011 DATE OF DISCHARGE:  03/31/2011  DIAGNOSES:  1. Acute on chronic respiratory failure due to chronic obstructive pulmonary disease exacerbation and acute bronchitis.  2. Hyperglycemia.  3. Hypertension.  4. Coronary artery disease status post stent.  5. Gastroesophageal reflux disease.  6. History of ongoing smoking.   DISPOSITION: The patient is being discharged home.   FOLLOWUP: Follow up with primary care physician, Dr. Brunetta Genera, 1 to 2 weeks after discharge.   DIET: Low sodium.   ACTIVITY: As tolerated.   DISCHARGE MEDICATIONS:  1. Oxygen 2 liters via nasal cannula at rest and 3 liters with exertion.  2. Prednisone taper as prescribed.  3. Tussionex 5 mL p.o. b.i.d. p.r.n. cough. 4. Advair 2 puffs b.i.d.  5. Spiriva 18 mcg inhaled daily.  6. DuoNebs q. 6 hours p.r.n. shortness of breath. 7. Levaquin 500 mg daily for five days.  8. Prilosec 20 mg b.i.d. 9. Lisinopril 20 mg daily.  10. Albuterol metered dose inhaler, 2 puffs 3 to 4 times a day p.r.n. 11. Plavix 75 mg daily.  12. Simvastatin 20 mg daily.  13. Cheratussin AC 10 mL every four hours.  RESULTS:  Blood cultures show no growth so far. Chest x-ray showed no acute cardiopulmonary pathology. CBC showed a white count of 18.3, glucose 204, hemoglobin A1c 5.8. Complete metabolic panel normal.   HOSPITAL COURSE: The patient is a 64 year old female with past medical history of chronic obstructive pulmonary disease, coronary artery disease, gastroesophageal reflux disease, and peripheral vascular disease, and right carotid endarterectomy who presented with cough, shortness of breath, and green expectoration. She was admitted with acute on chronic respiratory failure due to chronic obstructive pulmonary disease exacerbation and acute bronchitis. Her chest x-ray showed no pneumonia. Her blood cultures were negative. Her  influenza A and B were also negative. She was treated with nebulizer treatment, Spiriva, and Advair, IV steroids, and antibiotics. She had been switched to oral antibiotics and steroids by the time of discharge. Her glucose was 204. Therefore her hemoglobin A1c was checked, which was 5.8. The patient continues to smoke. She has been counseled about cessation. She is being discharged home in a stable condition.   TIME SPENT: 45 minutes.   ____________________________ Cherre Huger, MD sp:bjt D: 03/31/2011 17:02:23 ET T: 04/01/2011 10:54:38 ET JOB#: [id number]  cc: Cherre Huger, MD, <Dictator> Meindert A. Brunetta Genera, MD Cherre Huger MD ELECTRONICALLY SIGNED 04/01/2011 12:56

## 2014-04-29 LAB — SURGICAL PATHOLOGY

## 2014-05-05 NOTE — H&P (Signed)
PATIENT NAME:  Dawn Donaldson, Dawn Donaldson MR#:  [id number] DATE OF BIRTH:  November 03, 1950  DATE OF ADMISSION:  02/24/2014  REFERRING PHYSICIAN: Larae Grooms, MD.   FAMILY PHYSICIAN: Meindert A. Brunetta Genera, MD.   REASON FOR ADMISSION: Acute on chronic respiratory failure.   HISTORY OF PRESENT ILLNESS: The patient is a 64 year old female with a history of known COPD with chronic respiratory failure on 2 liters of oxygen at home. She also has a history of ASCVD, status post MI with PTCA and stent placement. Presents to the emergency room with a 2 day history of worsening shortness of breath. Recently completed a course of oral antibiotics and prednisone. In the emergency room, the patient was in profound respiratory distress. She was given IV steroids and SVNs with only minimal improvement. She was subsequently placed on BiPAP and is now admitted for further evaluation. The patient is a full code and does request to be intubated if needed.   PAST MEDICAL HISTORY: 1. COPD.  2. Chronic respiratory failure, on oxygen.  3. ASCVD status post PTCA with stent placement.  4. Previous myocardial infarction.  5. Osteoporosis.  6. Hyperlipidemia.  7. GE reflux disease.  8. Chronic back pain.  9. History of diverticulitis.  10. Benign hypertension.  11. Status post cholecystectomy.  12. Status post hysterectomy.  13. Status post appendectomy.   MEDICATIONS: 1. Spiriva 1 capsule inhaled daily.  2. Oxycodone 10 mg p.o. every 6 hours p.r.n. back pain.  3. Nexium 40 mg p.o. daily.  4. Zestoretic 20/12.5 mg 1 p.o. daily.  5. Advair 250/50 one puff b.i.d.  6. DuoNeb SVNs 4 times daily.  7. Combivent 1 puff 4 times daily as needed.  8. Plavix 75 mg p.o. daily.   ALLERGIES: ULTRAM.   SOCIAL HISTORY: Negative for alcohol or tobacco abuse.   FAMILY HISTORY: Positive for coronary artery disease, hypertension and stroke.   REVIEW OF SYSTEMS:  CONSTITUTIONAL: No fever or change in weight.  EYES: No blurred or double  vision. No glaucoma.  ENT: No tinnitus or hearing loss. No nasal discharge or bleeding. No difficulty swallowing.  RESPIRATORY: The patient has had cough and wheezing, but denies hemoptysis. No painful respiration.  CARDIOVASCULAR: No chest pain or orthopnea. No palpitations or syncope.  GASTROINTESTINAL: No nausea, vomiting or diarrhea. No abdominal pain or change in bowel habits.  GENITOURINARY: No dysuria or hematuria. No incontinence.  ENDOCRINE: No polyuria or polydipsia. No heat or cold intolerance.  HEMATOLOGIC: The patient denies anemia, easy bruising or bleeding.  LYMPHATIC: No swollen glands.  MUSCULOSKELETAL: The patient has pain in her back but denies neck, shoulder, knee or hip pain. No gout.  NEUROLOGIC: No numbness or migraines. Denies stroke or seizures.  PSYCHOLOGICAL: The patient denies anxiety, insomnia or depression.   PHYSICAL EXAMINATION: GENERAL: The patient is critically ill appearing, in moderate respiratory distress.  VITAL SIGNS: Currently remarkable for a blood pressure of 197/95 with a heart rate of 110, respiratory rate of 24, temperature of 98.8 and a saturation of 98% on BiPAP.  HEENT: Normocephalic, atraumatic. Pupils equally round and reactive to light and accommodation. Extraocular movements are intact. Sclerae are anicteric. Conjunctivae are clear. Oropharynx dry, but clear.  NECK: Supple without JVD. No adenopathy or thyromegaly is noted.  LUNGS: Revealed decreased breath sounds with scattered wheezes and rhonchi. No dullness. Respiratory effort is increased.  CARDIAC: Rapid rate with a regular rhythm. Normal S1 and S2. No significant murmurs, rubs or gallops. PMI is nondisplaced. Chest wall is nontender.  ABDOMEN: Soft, nontender with normoactive bowel sounds. No organomegaly or masses were appreciated. No hernias or bruits were noted.  EXTREMITIES: Without clubbing, cyanosis or edema. Pulses were 2+ bilaterally.  SKIN: Warm and dry without rash or lesions.   NEUROLOGIC: Cranial nerves 2 through 12 grossly intact. Deep tendon reflexes were symmetric. Motor and sensory exam is nonfocal.  PSYCHIATRIC: Revealed a patient who was alert and oriented to person, place and time. She was cooperative and used good judgment.   LABORATORY DATA: EKG revealed sinus tachycardia with no acute ischemic changes. Chest x-ray revealed COPD with no active disease. Her white count was 9.6 with a hemoglobin of 12.7. Glucose 106 with a BUN of 15, creatinine of 0.71 and a GFR of greater than 60. Troponin was less than 0.02.   ASSESSMENT: 1. Acute on chronic respiratory failure requiring BiPAP.  2. Chronic obstructive pulmonary disease exacerbation.  3. Presumed bronchitis.  4. Atherosclerotic cardiovascular disease.  5. Chronic back pain.   PLAN: The patient will be admitted to the intensive care unit on BiPAP. We will obtain an arterial blood gas and consult pulmonology urgently. We will begin IV steroids with IV antibiotics with DuoNeb small volume nebulizers. We will continue her Spiriva and Advair. We will follow her sugars while on steroids. We will follow serial cardiac enzymes and obtain an echocardiogram given her significant cardiac history. Follow up routine labs and chest x-ray in the morning. Further treatment and evaluation will depend upon the patient's progress.   TOTAL TIME SPENT: On this patient was 50 minutes.     ____________________________ Leonie Douglas Doy Hutching, MD jds:TT D: 02/24/2014 14:12:15 ET T: 02/24/2014 14:25:53 ET JOB#: [id number]  cc: Leonie Douglas. Doy Hutching, MD, <Dictator> Meindert A. Brunetta Genera, MD Mylik Pro Lennice Sites MD ELECTRONICALLY SIGNED 02/24/2014 17:41

## 2014-05-05 NOTE — H&P (Signed)
PATIENT NAME:  Dawn Donaldson, Dawn Donaldson MR#:  [id number] DATE OF BIRTH:  [birth year]  DATE OF ADMISSION:  02/03/2014  PRIMARY CARE PHYSICIAN: Meindert A. Brunetta Genera, MD   CHIEF COMPLAINT: Shortness of breath, cough with sputum, and wheezing for the past 1 week.   HISTORY OF PRESENT ILLNESS: A 64 year old Caucasian female with a history of COPD, chronic respiratory failure on home oxygen at night who presented to the ED with the above chief complaint. The patient is alert, awake, oriented, in no acute distress. The patient said she started to have shortness of breath, cough and wheezing 1 week ago. She was given Levaquin and prednisone 5 days ago, but it did not work well. In addition, the patient's symptoms have been worsening for the past few days, so the patient came to the ED for further evaluation. The patient denies any fever but has chills. The patient denies any orthopnea, nocturnal dyspnea, or leg edema.  The patient got nebulizer treatment in the ED.   PAST MEDICAL HISTORY: COPD, chronic respiratory failure on home oxygen 2 liters at night, CAD, hypertension, chronic back pain.   PAST SURGICAL HISTORY: Corneal implant in the back, appendectomy, gallbladder surgery, partial hysterectomy, bladder tuck, left carotid endarterectomy.   ALLERGIES: ULTRAM.  SOCIAL HISTORY: Denies any alcohol smoking or drinking or illicit drugs.   FAMILY HISTORY: Father died of heart disease. Mother had heart disease, breast cancer and atrial fibrillation.   HOME MEDICATIONS:  1.  Oxycodone 10 mg p.o. 4-6 hours p.r.n. for chronic back pain. 2.  Nexium 40 mg p.o. daily. 3.  Hydrochlorothiazide/lisinopril 12.5 mg/20 mg p.o. once a day.  4.  Fluticasone/salmeterol 250 mcg/50 mcg powder 1 pack twice a day.  5.  DuoNeb 2.5/0.5 mg 1 vial nebulizer 4 times a day.  6.  Combivent CFC free 100 mcg/20 mcg inhalation 1 tablet 4 times a day p.r.n. for shortness of breath.  7.  Plavix 75 mg p.o. daily.   REVIEW OF  SYSTEMS: CONSTITUTIONAL: The patient denies any fever or chills. No headache or dizziness. No weakness.  EYES: No double vision. No blurry vision.  EARS, NOSE, AND THROAT : No postnasal drip, slurred speech or dysphagia.  CARDIOVASCULAR: Chest pain while coughing. No palpitations. No orthopnea. No nocturnal dyspnea. No leg edema.  PULMONARY: Positive for cough, sputum, shortness of breath and wheezing. No hemoptysis.  GASTROINTESTINAL: No abdominal pain, nausea, vomiting, diarrhea. No melena or bloody stool.  GENITOURINARY: No dysuria, hematuria, or incontinence.  SKIN: No rash or jaundice.  NEUROLOGIC: No syncope, loss of consciousness, or seizure.  HEMATOLOGY: No easy bruising or bleeding.  ENDOCRINE: No polyuria, polydipsia, heat or cold intolerance.   PHYSICAL EXAMINATION:  VITAL SIGNS: Temperature 99.3, blood pressure 161/102, pulse 90, oxygen saturation 98% on room air.  GENERAL: The patient is alert, awake, oriented, in no acute distress.  HEENT: Pupils round, equal and reactive to light and accommodation. Moist oral mucosa. Clear oropharynx.  NECK: Supple. No JVD or carotid bruits. No lymphadenopathy. No thyromegaly.  CARDIOVASCULAR: S1 and S2. Regular rate and rhythm. No murmurs or gallops.  PULMONARY: Bilateral air entry. Bilateral expiratory wheezing. No crackles. No rales. No use of accessory muscle to breathe.  ABDOMEN: Soft. No distention or tenderness. No organomegaly. Bowel sounds present.  EXTREMITIES: No edema, clubbing or cyanosis. No calf tenderness. Bilateral pedal pulses present. SKIN: No rash or jaundice.  NEUROLOGIC: A and O x 3. No focal deficit. Power 5/5. Sensation intact.   LABORATORY DATA: Chest x-ray did not  show any acute cardiopulmonary abnormality. WBC 14.9, hemoglobin 12.7, platelets 226,000. Troponin less than 0.02. Glucose 108, BUN 14, creatinine 0.74. Electrolytes normal. EKG showed normal sinus rhythm at 87 BPM.   IMPRESSION:  1.  Chronic obstructive  pulmonary disease exacerbation.  2.  Chronic respiratory failure, on home oxygen 2 liters at night.  3.  Obesity.  4.  Coronary artery disease.  6.  Hypertension.  7.  Chronic back pain.   PLAN OF TREATMENT:  1.  The patient will be admitted to medical floor. I will start Solu-Medrol IV and with DuoNebs around-the-clock q. 4 hours. In addition, I will add Spiriva.  2.  For hypertension, continue hydrochlorothiazide/lisinopril.  3.  For CAD, continue Plavix.   I discussed the patient's condition and plan of treatment with the patient and the patient's husband.  TIME SPENT: About 56 minutes.  CODE STATUS: The patient wants full code.    ____________________________ Demetrios Loll, MD qc:TT D: 02/03/2014 15:10:25 ET T: 02/03/2014 15:44:27 ET JOB#: [id number]  cc: Demetrios Loll, MD, <Dictator> Demetrios Loll MD ELECTRONICALLY SIGNED 02/03/2014 17:41

## 2014-05-05 NOTE — Discharge Summary (Signed)
PATIENT NAME:  Dawn Donaldson, Dawn Donaldson MR#:  [id number] DATE OF BIRTH:  1950/01/11  DATE OF ADMISSION:  02/24/2014 DATE OF DISCHARGE:  02/27/2014  ADMITTING DIAGNOSIS: Chronic obstructive pulmonary disease exacerbation.   DISCHARGE DIAGNOSES:  1. Chronic respiratory failure with hypoxia.  2. Chronic obstructive pulmonary disease exacerbation.  3. Acute bronchitis.  4. History of hypertension, hyperlipidemia, coronary artery disease status post myocardial infarction in the remote past.   DISCHARGE CONDITION: Stable.   DISCHARGE MEDICATIONS: The patient is to continue hydrochlorothiazide/lisinopril 12.5/10 mg once daily, DuoNebs 1 vial 4 times daily as needed, Nexium 40 mg p.o. daily, oxycodone 10 mg p.o. every 4 to 6 hours as needed, Plavix 75 mg p.o. daily, benzonatate 200 mg 3 times daily, Combivent 1 puff 4 times daily as needed, Spiriva once daily. Prednisone taper 60 mg p.o. once, then taper by 10 mg every 2 days until stopped. Guaifenesin 600 mg p.o. twice daily, levofloxacin 750 mg p.o. once daily for 5 days.   HOME OXYGEN: With portable tank at 2 liters of oxygen via nasal cannula.   DIET: 2 grams salt, low-fat, low-cholesterol, regular consistency.   ACTIVITY LIMITATIONS: As tolerated.    FOLLOWUP APPOINTMENTS: With Dr. Brunetta Genera in 2 days after discharge.   CONSULTANTS: Care management, social work, Dr. Mortimer Fries, Dr. Devona Konig.   RADIOLOGIC STUDIES: Chest x-ray, portable single view, 02/24/2014, showed no active disease.   Chest, portable single view, 02/25/2014, showed no acute findings.   Echocardiogram, 02/24/2014, revealing left ventricular ejection fraction by visual estimation 60% to 65%, elevated mean left arterial pressure, impaired relaxation pattern of left ventricular diastolic filling, the left atrium is normal in size and structure, mild aortic valve sclerosis without stenosis, mildly increased left ventricular posterior wall thickness.   HOSPITAL COURSE: The patient is a  64 year old, Caucasian female, with history of COPD, who presents to the hospital on 02/24/2014 with complaints of 2-day history of worsening shortness of breath. The patient was treated as outpatient with a course of oral antibiotics, as well as prednisone; however, when she came in, she was found to be in respiratory distress. She was given antibiotics, nebulizers as well as steroids with minimal improvement, and she was placed on BiPAP and admitted for further evaluation.   On arrival to the Emergency Room, temperature was 98.8, pulse was 110, respiration rate was 24, blood pressure 197/95, saturation was 98% on BiPAP. Physical examination revealed diffuse breath sounds, as well as scattered wheezes and rhonchi. Respiratory effort was increased.   The patient's laboratory data done on arrival to the Emergency Room showed elevated glucose level of 106; otherwise BMP was unremarkable. The patient's liver enzymes were unremarkable. Cardiac enzymes x 3 were within normal limits. CBC was unremarkable. The patient's ABGs were performed and showed pH of 7.45, pCO2 was 40, pO2 108, saturation was 97.1% on BiPAP. EKG showed sinus tachycardia at 104 beats per minute, otherwise normal EKG. Chest x-ray was unremarkable.   The patient was initially begun on broad-spectrum antibiotic therapy with Rocephin and Zithromax, as well as steroids, as well as inhalation therapy. With this, her condition improved and she was much more comfortable as time progressed.   On the day of discharge, she was felt to be ready to be discharged home.   VITAL SIGNS ON THE DAY OF DISCHARGE: Temperature is 97.8, pulse was 67, respiration was 20, blood pressure 144/88, saturation was 98% to 99% on 2 liters of oxygen via nasal cannula at rest.   The patient was advised  to continue steroid taper, as well as inhalation therapy and antibiotic course.   We tried to obtain the patient's sputum for cultures; however, we were not successful.  The patient was advised to continue antibiotics to complete additional course of oral antibiotic therapy.   In regards to hypertension, hyperlipidemia, coronary artery disease, the patient is to continue outpatient management. No changes were made.   The patient is being discharged in stable condition with the above-mentioned medications and follow-up.   TIME SPENT: 40 minutes.   ____________________________ Theodoro Grist, MD rv:JT D: 02/27/2014 18:37:59 ET T: 02/28/2014 10:40:00 ET JOB#: [id number]  cc: Theodoro Grist, MD, <Dictator> Meindert A. Brunetta Genera, MD  Theodoro Grist MD ELECTRONICALLY SIGNED 03/19/2014 11:19

## 2014-05-05 NOTE — Discharge Summary (Signed)
PATIENT NAME:  Dawn, Donaldson MR#:  [id number] DATE OF BIRTH:  [birth year]  DATE OF ADMISSION:  02/03/2014 DATE OF DISCHARGE:  02/05/2014  DISCHARGE DIAGNOSIS: Chronic obstructive pulmonary disease exacerbation.   SECONDARY DIAGNOSES:    COPD, chronic respiratory failure on home oxygen 2 liters at night, CAD, hypertension, chronic back pain.  CONSULTATIONS: None.   PROCEDURES AND RADIOLOGY: Chest x-ray on February 03, 2014 showed no acute cardiopulmonary disease.   HISTORY AND SHORT HOSPITAL COURSE: The patient is a 64 year old female with the above-mentioned medical problems, who was admitted for shortness of breath, cough, and wheezing. She was found to have chronic obstructive pulmonary disease exacerbation. Please see Dr. Lianne Moris dictated history and physical for further details. The patient was started on steroids and antibiotic. She was improving very well. By February 05, 2014, she was doing much better , was feeling very close to her baseline and was discharged home in stable condition.   PHYSICAL EXAMINATION:  VITAL SIGNS: On the date of discharge, her vital signs are as follows: Temperature 98.2, heart rate 79 per minute, respirations 18 per minute, blood pressure 111/67. She is saturating 97% on room air.   PERTINENT PHYSICAL EXAMINATION ON THE DATE OF DISCHARGE:  CARDIOVASCULAR: S1, S2 normal. No murmurs, rubs, or gallop.  LUNGS: She did have some wheezing, which was much better than before where she was really tired and was not able to even breathe. She felt this was fairly close to her baseline and she does wheeze some even a rest.  ABDOMEN: Soft, benign.  NEUROLOGIC: Nonfocal examination.   All other physical examination remained at baseline.   DISCHARGE MEDICATIONS:    Medication Instructions  hydrochlorothiazide-lisinopril 12.5 mg-20 mg oral tablet  1 tab(s) orally once a day   duoneb 2.5 mg-0.5 mg/3 ml inhalation solution  1 vial(s) via nebulizer 4 times a day   nexium 40  mg oral delayed release capsule  1 cap(s) orally once a day   fluticasone-salmeterol 250 mcg-50 mcg inhalation powder  1 puff(s) inhaled 2 times a day   oxycodone 10 mg oral tablet  1 tab(s) orally every 4 to 6 hours for chronic back pain.   clopidogrel 75 mg oral tablet  1 tab(s) orally once a day (in the morning)   combivent cfc free 100 mcg-20 mcg/inh inhalation aerosol  1 puff(s) inhaled 4 times a day, As Needed - for Shortness of Breath   tiotropium 18 mcg inhalation capsule  1 cap(s) inhaled once a day   levofloxacin 750 mg oral tablet  1 tab(s) orally every 24 hours x 5 days   prednisone 10 mg oral tablet  Start at 60 mg and taper by 10 mg daily until complete   benzonatate 200 mg oral capsule  1 cap(s) orally 3 times a day x 5 days   cheracol-d 15 mg-100 mg/5 ml oral liquid  10 milliliter(s) orally 2 times a day    DISCHARGE DIET: Regular.   DISCHARGE ACTIVITY: As tolerated.   DISCHARGE INSTRUCTIONS FOLLOWUP: The patient was instructed to follow up with her primary care physician, Dr. Brunetta Genera in 1 to 2 weeks. She will need followup with Dr. Wallene Huh in 2 to 4 weeks. She will need a followup as an outpatient with pulmonary rehab.   TOTAL TIME DISCHARGING THIS PATIENT: 45 minutes.     ____________________________ Lucina Mellow. Manuella Ghazi, MD vss:ap D: 02/07/2014 16:48:28 ET T: 02/07/2014 17:12:44 ET JOB#: [id number]  cc: Fiza Nation S. Manuella Ghazi, MD, <Dictator> Meindert A. Brunetta Genera,  MD Herbon E. Raul Del, [place] MD ELECTRONICALLY SIGNED 02/07/2014 17:46

## 2014-05-23 ENCOUNTER — Emergency Department: Payer: Medicare Other

## 2014-05-23 ENCOUNTER — Inpatient Hospital Stay
Admission: EM | Admit: 2014-05-23 | Discharge: 2014-05-27 | DRG: 190 | Disposition: A | Discharge status: Home / Self Care | Payer: Medicare Other | Attending: Internal Medicine | Admitting: Internal Medicine

## 2014-05-23 ENCOUNTER — Encounter: Payer: Self-pay | Admitting: Emergency Medicine

## 2014-05-23 DIAGNOSIS — E785 Hyperlipidemia, unspecified: Secondary | ICD-10-CM | POA: Diagnosis present

## 2014-05-23 DIAGNOSIS — R0902 Hypoxemia: Secondary | ICD-10-CM

## 2014-05-23 DIAGNOSIS — Z9981 Dependence on supplemental oxygen: Secondary | ICD-10-CM

## 2014-05-23 DIAGNOSIS — Z955 Presence of coronary angioplasty implant and graft: Secondary | ICD-10-CM | POA: Diagnosis not present

## 2014-05-23 DIAGNOSIS — Z79899 Other long term (current) drug therapy: Secondary | ICD-10-CM | POA: Diagnosis not present

## 2014-05-23 DIAGNOSIS — I2510 Atherosclerotic heart disease of native coronary artery without angina pectoris: Secondary | ICD-10-CM | POA: Diagnosis present

## 2014-05-23 DIAGNOSIS — Z888 Allergy status to other drugs, medicaments and biological substances status: Secondary | ICD-10-CM

## 2014-05-23 DIAGNOSIS — G8929 Other chronic pain: Secondary | ICD-10-CM | POA: Diagnosis present

## 2014-05-23 DIAGNOSIS — I5032 Chronic diastolic (congestive) heart failure: Secondary | ICD-10-CM | POA: Diagnosis present

## 2014-05-23 DIAGNOSIS — I10 Essential (primary) hypertension: Secondary | ICD-10-CM | POA: Diagnosis present

## 2014-05-23 DIAGNOSIS — J449 Chronic obstructive pulmonary disease, unspecified: Secondary | ICD-10-CM | POA: Diagnosis present

## 2014-05-23 DIAGNOSIS — J441 Chronic obstructive pulmonary disease with (acute) exacerbation: Principal | ICD-10-CM | POA: Diagnosis present

## 2014-05-23 DIAGNOSIS — Z7902 Long term (current) use of antithrombotics/antiplatelets: Secondary | ICD-10-CM | POA: Diagnosis not present

## 2014-05-23 DIAGNOSIS — J9620 Acute and chronic respiratory failure, unspecified whether with hypoxia or hypercapnia: Secondary | ICD-10-CM | POA: Diagnosis present

## 2014-05-23 DIAGNOSIS — Z7982 Long term (current) use of aspirin: Secondary | ICD-10-CM | POA: Diagnosis not present

## 2014-05-23 HISTORY — DX: Unspecified diastolic (congestive) heart failure: I50.30

## 2014-05-23 HISTORY — DX: Essential (primary) hypertension: I10

## 2014-05-23 HISTORY — DX: Chronic obstructive pulmonary disease, unspecified: J44.9

## 2014-05-23 HISTORY — DX: Atherosclerotic heart disease of native coronary artery without angina pectoris: I25.10

## 2014-05-23 HISTORY — DX: Hyperlipidemia, unspecified: E78.5

## 2014-05-23 LAB — BASIC METABOLIC PANEL
Anion gap: 7 (ref 5–15)
BUN: 17 mg/dL (ref 6–20)
CHLORIDE: 106 mmol/L (ref 101–111)
CO2: 29 mmol/L (ref 22–32)
Calcium: 9.5 mg/dL (ref 8.9–10.3)
Creatinine, Ser: 0.79 mg/dL (ref 0.44–1.00)
GFR calc Af Amer: >60 mL/min (ref >60)
Glucose, Bld: 101 mg/dL — ABNORMAL HIGH (ref 65–99)
POTASSIUM: 3.8 mmol/L (ref 3.5–5.1)
Sodium: 142 mmol/L (ref 135–145)

## 2014-05-23 LAB — CBC
HCT: 43.5 % (ref 35.0–47.0)
HEMATOCRIT: 42.1 % (ref 35.0–47.0)
HEMOGLOBIN: 13.9 g/dL (ref 12.0–16.0)
Hemoglobin: 14.1 g/dL (ref 12.0–16.0)
MCH: 31 pg (ref 26.0–34.0)
MCH: 30.5 pg (ref 26.0–34.0)
MCHC: 33.1 g/dL (ref 32.0–36.0)
MCHC: 32.4 g/dL (ref 32.0–36.0)
MCV: 93.8 fL (ref 80.0–100.0)
MCV: 94 fL (ref 80.0–100.0)
PLATELETS: 228 10*3/uL (ref 150–440)
Platelets: 240 10*3/uL (ref 150–440)
RBC: 4.49 MIL/uL (ref 3.80–5.20)
RBC: 4.62 MIL/uL (ref 3.80–5.20)
RDW: 14.8 % — ABNORMAL HIGH (ref 11.5–14.5)
RDW: 14.2 % (ref 11.5–14.5)
WBC: 13 10*3/uL — ABNORMAL HIGH (ref 3.6–11.0)
WBC: 13.9 10*3/uL — ABNORMAL HIGH (ref 3.6–11.0)

## 2014-05-23 LAB — CREATININE, SERUM
CREATININE: 0.73 mg/dL (ref 0.44–1.00)
GFR calc Af Amer: >60 mL/min (ref >60)
GFR calc non Af Amer: >60 mL/min (ref >60)

## 2014-05-23 LAB — TROPONIN I

## 2014-05-23 MED ORDER — NITROGLYCERIN 0.4 MG SL SUBL: 0.4000 mg | SUBLINGUAL_TABLET | SUBLINGUAL | Status: DC | PRN

## 2014-05-23 MED ORDER — LEVOFLOXACIN 750 MG PO TABS: 750.0000 mg | ORAL_TABLET | Freq: Every day | ORAL | Status: DC

## 2014-05-23 MED ORDER — IPRATROPIUM-ALBUTEROL 0.5-2.5 (3) MG/3ML IN SOLN
3.0000 mL | Freq: Once | RESPIRATORY_TRACT | Status: AC
  Administered 2014-05-23: 3 mL via RESPIRATORY_TRACT

## 2014-05-23 MED ORDER — ALUM & MAG HYDROXIDE-SIMETH 200-200-20 MG/5ML PO SUSP: 30.0000 mL | Freq: Four times a day (QID) | ORAL | Status: DC | PRN

## 2014-05-23 MED ORDER — ASPIRIN EC 81 MG PO TBEC
81.0000 mg | DELAYED_RELEASE_TABLET | Freq: Every day | ORAL | Status: DC
  Administered 2014-05-23 – 2014-05-27 (×5): 81 mg via ORAL
  Filled 2014-05-23 (×5): qty 1

## 2014-05-23 MED ORDER — FUROSEMIDE 40 MG PO TABS
40.0000 mg | ORAL_TABLET | Freq: Every day | ORAL | Status: DC
  Administered 2014-05-23 – 2014-05-27 (×5): 40 mg via ORAL
  Filled 2014-05-23 (×5): qty 1

## 2014-05-23 MED ORDER — LISINOPRIL 20 MG PO TABS
20.0000 mg | ORAL_TABLET | Freq: Every day | ORAL | Status: DC
  Administered 2014-05-23 – 2014-05-27 (×5): 20 mg via ORAL
  Filled 2014-05-23 (×5): qty 1

## 2014-05-23 MED ORDER — SIMVASTATIN 20 MG PO TABS
20.0000 mg | ORAL_TABLET | Freq: Every day | ORAL | Status: DC
  Administered 2014-05-23 – 2014-05-27 (×5): 20 mg via ORAL
  Filled 2014-05-23 (×5): qty 1

## 2014-05-23 MED ORDER — IPRATROPIUM-ALBUTEROL 0.5-2.5 (3) MG/3ML IN SOLN
6.0000 mL | Freq: Once | RESPIRATORY_TRACT | Status: AC
  Administered 2014-05-23: 6 mL via RESPIRATORY_TRACT

## 2014-05-23 MED ORDER — METHYLPREDNISOLONE SODIUM SUCC 125 MG IJ SOLR
60.0000 mg | Freq: Three times a day (TID) | INTRAMUSCULAR | Status: DC
  Administered 2014-05-23 – 2014-05-27 (×12): 60 mg via INTRAVENOUS
  Filled 2014-05-23 (×12): qty 2

## 2014-05-23 MED ORDER — ALBUTEROL SULFATE (2.5 MG/3ML) 0.083% IN NEBU: 3.0000 mL | INHALATION_SOLUTION | RESPIRATORY_TRACT | Status: DC | PRN

## 2014-05-23 MED ORDER — HYDRALAZINE HCL 20 MG/ML IJ SOLN
10.0000 mg | Freq: Four times a day (QID) | INTRAMUSCULAR | Status: DC | PRN
  Administered 2014-05-23: 17:00:00 10 mg via INTRAVENOUS
  Filled 2014-05-23: qty 1

## 2014-05-23 MED ORDER — ONDANSETRON HCL 4 MG PO TABS: 4.0000 mg | ORAL_TABLET | ORAL | Status: DC | PRN

## 2014-05-23 MED ORDER — THEOPHYLLINE ER 200 MG PO TB12
100.0000 mg | ORAL_TABLET | Freq: Two times a day (BID) | ORAL | Status: DC
  Administered 2014-05-23 – 2014-05-27 (×8): 100 mg via ORAL
  Filled 2014-05-23 (×9): qty 1

## 2014-05-23 MED ORDER — HEPARIN SODIUM (PORCINE) 5000 UNIT/ML IJ SOLN
5000.0000 [IU] | Freq: Three times a day (TID) | INTRAMUSCULAR | Status: DC
  Administered 2014-05-23 – 2014-05-27 (×12): 5000 [IU] via SUBCUTANEOUS
  Filled 2014-05-23 (×12): qty 1

## 2014-05-23 MED ORDER — ONDANSETRON HCL 4 MG PO TABS
4.0000 mg | ORAL_TABLET | Freq: Four times a day (QID) | ORAL | Status: DC | PRN
  Administered 2014-05-23 – 2014-05-26 (×9): 4 mg via ORAL
  Filled 2014-05-23 (×10): qty 1

## 2014-05-23 MED ORDER — PANTOPRAZOLE SODIUM 40 MG PO TBEC
40.0000 mg | DELAYED_RELEASE_TABLET | Freq: Every day | ORAL | Status: DC
  Administered 2014-05-23 – 2014-05-27 (×5): 40 mg via ORAL
  Filled 2014-05-23 (×5): qty 1

## 2014-05-23 MED ORDER — IPRATROPIUM-ALBUTEROL 0.5-2.5 (3) MG/3ML IN SOLN
3.0000 mL | RESPIRATORY_TRACT | Status: DC
Start: 2014-05-23 — End: 2014-05-27
  Administered 2014-05-23 – 2014-05-27 (×24): 3 mL via RESPIRATORY_TRACT
  Filled 2014-05-23 (×23): qty 3

## 2014-05-23 MED ORDER — MAGNESIUM SULFATE 2 GM/50ML IV SOLN
INTRAVENOUS | Status: AC
  Administered 2014-05-23: 2 g via INTRAVENOUS
  Filled 2014-05-23: qty 50

## 2014-05-23 MED ORDER — DEXTROSE 5 % IV SOLN
500.0000 mg | INTRAVENOUS | Status: DC
  Administered 2014-05-23 – 2014-05-26 (×4): 500 mg via INTRAVENOUS
  Filled 2014-05-23 (×5): qty 500

## 2014-05-23 MED ORDER — IPRATROPIUM-ALBUTEROL 20-100 MCG/ACT IN AERS
1.0000 | INHALATION_SPRAY | Freq: Four times a day (QID) | RESPIRATORY_TRACT | Status: DC
  Filled 2014-05-23: qty 4

## 2014-05-23 MED ORDER — MAGNESIUM SULFATE 2 GM/50ML IV SOLN
2.0000 g | Freq: Once | INTRAVENOUS | Status: AC
  Administered 2014-05-23: 2 g via INTRAVENOUS

## 2014-05-23 MED ORDER — METHYLPREDNISOLONE SODIUM SUCC 125 MG IJ SOLR
INTRAMUSCULAR | Status: AC
  Administered 2014-05-23: 125 mg via INTRAVENOUS
  Filled 2014-05-23: qty 2

## 2014-05-23 MED ORDER — ACETAMINOPHEN 325 MG PO TABS: 650.0000 mg | ORAL_TABLET | Freq: Four times a day (QID) | ORAL | Status: DC | PRN

## 2014-05-23 MED ORDER — CLOPIDOGREL BISULFATE 75 MG PO TABS
75.0000 mg | ORAL_TABLET | Freq: Every day | ORAL | Status: DC
  Administered 2014-05-23 – 2014-05-27 (×5): 75 mg via ORAL
  Filled 2014-05-23 (×5): qty 1

## 2014-05-23 MED ORDER — METHYLPREDNISOLONE SODIUM SUCC 125 MG IJ SOLR
125.0000 mg | Freq: Once | INTRAMUSCULAR | Status: AC
  Administered 2014-05-23: 125 mg via INTRAVENOUS

## 2014-05-23 MED ORDER — PREDNISONE 20 MG PO TABS
20.0000 mg | ORAL_TABLET | Freq: Two times a day (BID) | ORAL | Status: DC
  Administered 2014-05-23 – 2014-05-24 (×3): 20 mg via ORAL
  Filled 2014-05-23 (×3): qty 1

## 2014-05-23 MED ORDER — SENNOSIDES-DOCUSATE SODIUM 8.6-50 MG PO TABS
1.0000 | ORAL_TABLET | Freq: Every evening | ORAL | Status: DC | PRN
  Administered 2014-05-27: 1 via ORAL
  Filled 2014-05-23: qty 1

## 2014-05-23 MED ORDER — OXYCODONE HCL 5 MG PO TABS
10.0000 mg | ORAL_TABLET | Freq: Four times a day (QID) | ORAL | Status: DC | PRN
Start: 2014-05-23 — End: 2014-05-26
  Administered 2014-05-23 – 2014-05-26 (×9): 10 mg via ORAL
  Filled 2014-05-23 (×10): qty 2

## 2014-05-23 MED ORDER — LORAZEPAM 0.5 MG PO TABS: 0.5000 mg | ORAL_TABLET | Freq: Two times a day (BID) | ORAL | Status: DC | PRN

## 2014-05-23 MED ORDER — ACETAMINOPHEN 650 MG RE SUPP: 650.0000 mg | Freq: Four times a day (QID) | RECTAL | Status: DC | PRN

## 2014-05-23 MED ORDER — ONDANSETRON HCL 4 MG/2ML IJ SOLN
4.0000 mg | Freq: Four times a day (QID) | INTRAMUSCULAR | Status: DC | PRN
  Filled 2014-05-23: qty 2

## 2014-05-23 MED ORDER — IPRATROPIUM-ALBUTEROL 0.5-2.5 (3) MG/3ML IN SOLN
RESPIRATORY_TRACT | Status: AC
  Administered 2014-05-23: 3 mL via RESPIRATORY_TRACT
  Filled 2014-05-23: qty 3

## 2014-05-23 MED ORDER — IPRATROPIUM-ALBUTEROL 0.5-2.5 (3) MG/3ML IN SOLN
RESPIRATORY_TRACT | Status: AC
  Administered 2014-05-23: 6 mL via RESPIRATORY_TRACT
  Filled 2014-05-23: qty 6

## 2014-05-23 NOTE — ED Provider Notes (Signed)
[facility] Emergency Department Provider Note  ____________________________________________  Time seen: Approximately 1:42 PM  I have reviewed the triage vital signs and the nursing notes.   HISTORY  Chief Complaint Shortness of Breath    HPI Dawn Donaldson is a 64 y.o. female with a history of COPD who presents today with 5 days of worsening COPD symptoms. Says that changes in the weather sometimes will set off a COPD flare and thinks that this may be the cause of this incidence. Patient says that she went to her doctor either Monday or Tuesday and was prescribed steroids and Levaquin. She has been taking 40 mg of prednisone a day as well as her daily Levaquin. She did take her prednisone and Levaquin this morning. Despite being compliant with her meds and also taking 5 neb treatments at home this morning she is persistently to With labored respirations. She says that she is becoming tired at this point. She was last noted to the ICU for her COPD 3 months ago when she needed to be on BiPAP.She takes oxygen at home only at night.   Past Medical History  Diagnosis Date  . COPD (chronic obstructive pulmonary disease)   . Hypertension     There are no active problems to display for this patient.   No past surgical history on file.  No current outpatient prescriptions on file.  Allergies Ultram  No family history on file.  Social History History  Substance Use Topics  . Smoking status: Never Smoker   . Smokeless tobacco: Not on file  . Alcohol Use: No    Review of Systems Constitutional: No fever/chills Eyes: No visual changes. ENT: No sore throat. Cardiovascular: Chest tightness consistent with previous COPD exacerbations. Says "it just feels like I can get enough air in." Respiratory: Shortness of breath  Gastrointestinal: No abdominal pain.  No nausea, no vomiting.  No diarrhea.  No constipation. Genitourinary: Negative for  dysuria. Musculoskeletal: Negative for back pain. Skin: Negative for rash. Neurological: Negative for headaches, focal weakness or numbness.  10-point ROS otherwise negative.  ____________________________________________   PHYSICAL EXAM:  VITAL SIGNS: ED Triage Vitals  Enc Vitals Group     BP 05/23/14 1242 209/108 mmHg     Pulse Rate 05/23/14 1242 106     Resp 05/23/14 1242 32     Temp 05/23/14 1242 98.1 F (36.7 C)     Temp Source 05/23/14 1242 Oral     SpO2 05/23/14 1242 100 %     Weight 05/23/14 1238 200 lb (90.719 kg)     Height 05/23/14 1238 5\' 2"  (1.575 m)     Head Cir --      Peak Flow --      Pain Score 05/23/14 1239 4     Pain Loc --      Pain Edu? --      Excl. in [place]? --     Constitutional: Alert and oriented. Well appearing and in no acute distress. Eyes: Conjunctivae are normal. PERRL. EOMI. Head: Atraumatic. Nose: No congestion/rhinnorhea. Mouth/Throat: Mucous membranes are moist.  Oropharynx non-erythematous. Cardiovascular: Normal rate, regular rhythm. Grossly normal heart sounds.  Good peripheral circulation. Respiratory: Tachypneic with use of accessory muscles. Mild retractions supraclavicular.  Decreased air movement throughout with wheezes.  Gastrointestinal: Soft and nontender. No distention. No abdominal bruits. No CVA tenderness. Musculoskeletal: No lower extremity tenderness nor edema.  No joint effusions. Neurologic:  Normal speech and language. No gross focal neurologic deficits are  appreciated. Speech is normal. No gait instability. Skin:  Skin is warm, dry and intact. No rash noted. Psychiatric: Mood and affect are normal. Speech and behavior are normal.  ____________________________________________   LABS (all labs ordered are listed, but only abnormal results are displayed)  Labs Reviewed  BASIC METABOLIC PANEL - Abnormal; Notable for the following:    Glucose, Bld 101 (*)    All other components within normal limits  CBC - Abnormal;  Notable for the following:    WBC 13.0 (*)    RDW 14.8 (*)    All other components within normal limits  TROPONIN I   ____________________________________________  EKG   Date: 05/23/2014  Rate: 104  Rhythm: Sinus tachycardia  QRS Axis: normal  Intervals: normal  ST/T Wave abnormalities: normal  Conduction Disutrbances: none  Narrative Interpretation: Sinus tachycardia otherwise normal EKG     ____________________________________________  RADIOLOGY  Chest x-ray and NAD ____________________________________________   PROCEDURES  CRITICAL CARE Performed by: Doran Stabler   Total critical care time: 35 minutes  Critical care time was exclusive of separately billable procedures and treating other patients.  Critical care was necessary to treat or prevent imminent or life-threatening deterioration.  Critical care was time spent personally by me on the following activities: development of treatment plan with patient and/or surrogate as well as nursing, discussions with consultants, evaluation of patient's response to treatment, examination of patient, obtaining history from patient or surrogate, ordering and performing treatments and interventions, ordering and review of laboratory studies, ordering and review of radiographic studies, pulse oximetry and re-evaluation of patient's condition.  Critical care necessary because of patient on BiPAP.   ____________________________________________   INITIAL IMPRESSION / ASSESSMENT AND PLAN / ED COURSE  Pertinent labs & imaging results that were available during my care of the patient were reviewed by me and considered in my medical decision making (see chart for details).  Patient with worsening COPD and failing outpatient treatment. We'll give trial of BiPAP. Will require admission to the hospital. Signed out to the hospitalist, Dr. Genia Harold, while pt on BiPAP.  Goal to wean patient off BiPAP prior to leaving the emergency  department. ____________________________________________   FINAL CLINICAL IMPRESSION(S) / ED DIAGNOSES  Acute COPD exacerbation.  Patient treatment. Return visit.    Orbie Pyo, MD 05/23/14 [phone]

## 2014-05-23 NOTE — Progress Notes (Signed)
On call MD paged due to no order for pain medication that pt takes daily for chronic back pain. MD stated they could not put orders in at this time. Orders received and read back.  Hiram Gash [employer]

## 2014-05-23 NOTE — H&P (Signed)
[place] at [place] NAME: Dawn Donaldson    MR#:  [id number]  DATE OF BIRTH:  12-28-1950  DATE OF ADMISSION:  05/23/2014  PRIMARY CARE PHYSICIAN: Lorelee Market, MD   REQUESTING/REFERRING PHYSICIAN: Dr. Wilnette Kales  CHIEF COMPLAINT:   Shortness of breath and wheezing HISTORY OF PRESENT ILLNESS:  Dawn Donaldson  is a 64 y.o. female with a known history of chronic respiratory failure due to COPD and CAD who presents with above complaint. Patient says over the past 2 days she has had increasing shortness of breath, wheezing, cough without sputum production. She used her inhaler and nebulizers without relief. She wears 2 L of oxygen at home. She saw her primary care physician on Tuesday for these symptoms and he prescribed Levaquin. She is taking Levaquin for 2 days. In the emergency apartment she was noted have bilateral wheezing and she has received 3 nebulizer treatments without much effect. Hospital service is consulted for admission.  PAST MEDICAL HISTORY:   Past Medical History  Diagnosis Date  . COPD (chronic obstructive pulmonary disease)   . Hypertension    CAD with stent Hyperlipidemia PAST SURGICAL HISTORY:  Cholecystectomy Hysterectomy Right CEA  SOCIAL HISTORY:   History  Substance Use Topics  . Smoking status: Never Smoker   . Smokeless tobacco: Not on file  . Alcohol Use: No    FAMILY HISTORY:   Hypertension, diabetes, CAD, CVA DRUG ALLERGIES:   Allergies  Allergen Reactions  . Dexlansoprazole Nausea And Vomiting  . Ultram [Tramadol] Itching     REVIEW OF SYSTEMS:  CONSTITUTIONAL: No fever chills positive weakness  EYES: No blurred or double vision.  EARS, NOSE, AND THROAT: No tinnitus or ear pain.  RESPIRATORY: Positive cough, shortness of breath and wheezing.  CARDIOVASCULAR: No chest pain, orthopnea, edema.  GASTROINTESTINAL: No nausea, vomiting, diarrhea or abdominal pain.  GENITOURINARY: No  dysuria, hematuria.  ENDOCRINE: No polyuria, nocturia,  HEMATOLOGY: No anemia, easy bruising or bleeding SKIN: No rash or lesion. MUSCULOSKELETAL: No joint pain or arthritis.   NEUROLOGIC: No tingling, numbness, weakness.  PSYCHIATRY: No anxiety or depression.   MEDICATIONS AT HOME:   Prior to Admission medications   Medication Sig Start Date End Date Taking? Authorizing Provider  albuterol (PROVENTIL HFA;VENTOLIN HFA) 108 (90 BASE) MCG/ACT inhaler Inhale 2 puffs into the lungs every 4 (four) hours as needed for wheezing or shortness of breath.    Yes Historical Provider, MD  aspirin EC 81 MG tablet Take 81 mg by mouth daily.    Yes Historical Provider, MD  clopidogrel (PLAVIX) 75 MG tablet Take 75 mg by mouth daily.  05/14/14  Yes Historical Provider, MD  COMBIVENT RESPIMAT 20-100 MCG/ACT AERS respimat Inhale 1 puff into the lungs 4 (four) times daily.  05/09/14  Yes Historical Provider, MD  furosemide (LASIX) 40 MG tablet Take 40 mg by mouth daily.  05/14/14  Yes Historical Provider, MD  levofloxacin (LEVAQUIN) 750 MG tablet Take 750 mg by mouth daily.  05/20/14  Yes Historical Provider, MD  lisinopril (PRINIVIL,ZESTRIL) 20 MG tablet Take 20 mg by mouth daily.  05/14/14  Yes Historical Provider, MD  LORazepam (ATIVAN) 0.5 MG tablet Take 0.5 mg by mouth 2 (two) times daily as needed for anxiety.  05/21/14  Yes Historical Provider, MD  NEXIUM 40 MG capsule Take 40 mg by mouth 2 (two) times daily.  05/14/14  Yes Historical Provider, MD  nitroGLYCERIN (NITROSTAT) 0.4 MG SL tablet Place 0.4 mg under  the tongue every 5 (five) minutes as needed.  05/24/12  Yes Historical Provider, MD  ondansetron (ZOFRAN) 4 MG tablet Take 4 mg by mouth every 4 (four) hours as needed for nausea or vomiting.  05/14/14  Yes Historical Provider, MD  Oxycodone HCl 10 MG TABS Take 10 mg by mouth every 4 (four) hours as needed.  05/14/14  Yes Historical Provider, MD  predniSONE (DELTASONE) 20 MG tablet Take 20 mg by mouth 2 (two)  times daily.  05/20/14  Yes Historical Provider, MD  simvastatin (ZOCOR) 20 MG tablet Take 20 mg by mouth daily.  05/14/14  Yes Historical Provider, MD  theophylline (THEODUR) 100 MG 12 hr tablet Take 100 mg by mouth 2 (two) times daily.  05/14/14  Yes Historical Provider, MD      VITAL SIGNS:  Blood pressure 137/92, pulse 96, temperature 98.1 F (36.7 C), temperature source Oral, resp. rate 13, height 5\' 2"  (1.575 m), weight 90.719 kg (200 lb), SpO2 95 %.  PHYSICAL EXAMINATION:  GENERAL:  64 y.o.-year-old patient lying in the bed with no acute distress.  EYES: Pupils equal, round, reactive to light and accommodation. No scleral icterus. Extraocular muscles intact.  HEENT: Head atraumatic, normocephalic. Oropharynx and nasopharynx clear.  NECK:  Supple, no jugular venous distention. No thyroid enlargement, no tenderness.  LUNGS: Positive bilateral wheezing with fair air movement. No rales or rhonchi are heard  CARDIOVASCULAR: S1, S2 normal. No murmurs, rubs, or gallops.  ABDOMEN: Soft, nontender, nondistended. Bowel sounds present. No organomegaly or mass.  EXTREMITIES: No pedal edema, cyanosis, or clubbing.  NEUROLOGIC: Cranial nerves II through XII are intact. Muscle strength 5/5 in all extremities.  PSYCHIATRIC: The patient is alert and oriented x 3.  SKIN: No obvious rash, lesion, or ulcer.   LABORATORY PANEL:   CBC  Recent Labs Lab 05/23/14 1253  WBC 13.0*  HGB 13.9  HCT 42.1  PLT 240   ------------------------------------------------------------------------------------------------------------------  Chemistries   Recent Labs Lab 05/23/14 1253  NA 142  K 3.8  CL 106  CO2 29  GLUCOSE 101*  BUN 17  CREATININE 0.79  CALCIUM 9.5   ------------------------------------------------------------------------------------------------------------------  Cardiac Enzymes  Recent Labs Lab 05/23/14 1253  TROPONINI <0.03    ------------------------------------------------------------------------------------------------------------------  RADIOLOGY:  Dg Chest 2 View (if Patient Has Fever And/or Copd)  05/23/2014     IMPRESSION: No active cardiopulmonary disease.   Electronically Signed   By: Kathreen Devoid   On: 05/23/2014 13:36    EKG:   Orders placed or performed during the hospital encounter of 05/23/14  . ED EKG  (if patient has PMH of COPD)  . ED EKG  (if patient has PMH of COPD)   Sinus tachycardia heart rate 104 no ST elevation or depression  IMPRESSION AND PLAN:  Is a 64 year old female with a history of chronic respiratory failure on 2 L of oxygen, COPD and CAD who presents with acute on chronic respiratory failure.  1. Acute on chronic respiratory failure due to acute COPD exacerbation: Patient will be admitted  to hospital service. She will require IV steroids, Oxygen, azithromycin, Nebs and outpatient Inhalers. We will closely monitor patient for signs of decompensation.  2. Acute COPD exacerbation: I will continue management as stated above. Patient is on 2 L of oxygen at home. We will wean oxygen level to her baseline. She is also on theophylline which I will continue.  3.. Coronary artery disease status post stent: Patient will continue her outpatient medications including Plavix, aspirin, nitroglycerin  when necessary and simvastatin.  4. Hypertension: Patient will continue lisinopril. We will continue to monitor blood pressure.  5. Hyperlipidemia: Patient is on Zocor which I'll continue.       All the records are reviewed and case discussed with ED provider. Management plans discussed with the patient and she is in agreement.  CODE STATUS: Full  TOTAL TIME TAKING CARE OF THIS PATIENT: 50 minutes.    Tongela Encinas M.D on 05/23/2014 at 3:38 PM  Between 7am to 6pm - Pager - [phone] After 6pm go to [email or web address] - password [email or web address] [facility]  Office   [phone]  CC: Primary care physician; Lorelee Market, MD

## 2014-05-23 NOTE — ED Notes (Signed)
Spoke with Dr Benjie Karvonen; verbal orders for patient to come off bipap and placed on 2L via [place]. Merry Proud, RN updated on plan of care.

## 2014-05-23 NOTE — ED Notes (Signed)
Pt reports that she has COPD and went to her PMD  She gave her two of prednisone. Her PMD told her that if she wasn't any better to come to the ER. Pt is only able to speak a few words. She has auditory wheezes. She wears O2 at night. Pt is tachapenic.

## 2014-05-23 NOTE — Plan of Care (Signed)
Problem: Discharge Progression Outcomes Goal: Discharge plan in place and appropriate Individualization  Problem: Discharge Progression Outcomes Goal: Discharge plan in place and appropriate Individualization of Care Pt prefers to be called Salia Hx. HTN controlled with meds at home Moderate Fall Precautions Goal: Other Discharge Outcomes/Goals Outcome: Progressing Plan of care progress to goal for: COPD - Continues breathing tx. - Continues on IV solumedrol. - Continues on ABX. - Will continue to monitor.

## 2014-05-24 LAB — BASIC METABOLIC PANEL
ANION GAP: 8 (ref 5–15)
BUN: 17 mg/dL (ref 6–20)
CALCIUM: 9.3 mg/dL (ref 8.9–10.3)
CO2: 26 mmol/L (ref 22–32)
Chloride: 105 mmol/L (ref 101–111)
Creatinine, Ser: 0.79 mg/dL (ref 0.44–1.00)
GFR calc non Af Amer: >60 mL/min (ref >60)
Glucose, Bld: 170 mg/dL — ABNORMAL HIGH (ref 65–99)
Potassium: 4.2 mmol/L (ref 3.5–5.1)
Sodium: 139 mmol/L (ref 135–145)

## 2014-05-24 LAB — CBC
HCT: 42.2 % (ref 35.0–47.0)
HEMOGLOBIN: 13.5 g/dL (ref 12.0–16.0)
MCH: 30.5 pg (ref 26.0–34.0)
MCHC: 32.1 g/dL (ref 32.0–36.0)
MCV: 95 fL (ref 80.0–100.0)
PLATELETS: 237 10*3/uL (ref 150–440)
RBC: 4.44 MIL/uL (ref 3.80–5.20)
RDW: 14.8 % — AB (ref 11.5–14.5)
WBC: 16.3 10*3/uL — AB (ref 3.6–11.0)

## 2014-05-24 NOTE — Progress Notes (Signed)
[place] at [place] NAME: Dawn Donaldson    MR#:  [id number]  DATE OF BIRTH:  1950-09-26  SUBJECTIVE:  Still has some SOB and wheezing, family at bedside. On 3 liters N.C.  REVIEW OF SYSTEMS:  Review of Systems  Constitutional: Negative for fever, chills and weight loss.  HENT: Negative for nosebleeds and sore throat.   Eyes: Negative for blurred vision.  Respiratory: Positive for shortness of breath and wheezing. Negative for cough.   Cardiovascular: Negative for chest pain, orthopnea, leg swelling and PND.  Gastrointestinal: Negative for heartburn, nausea, vomiting, abdominal pain, diarrhea and constipation.  Genitourinary: Negative for dysuria and urgency.  Musculoskeletal: Negative for back pain.  Skin: Negative for rash.  Neurological: Negative for dizziness, speech change, focal weakness and headaches.  Endo/Heme/Allergies: Does not bruise/bleed easily.  Psychiatric/Behavioral: Negative for depression.    DRUG ALLERGIES:   Allergies  Allergen Reactions  . Dexlansoprazole Nausea And Vomiting  . Ultram [Tramadol] Itching    VITALS:  Blood pressure 122/66, pulse 78, temperature 97.8 F (36.6 C), temperature source Oral, resp. rate 18, height 5\' 2"  (1.575 m), weight 90.719 kg (200 lb), SpO2 91 %.  PHYSICAL EXAMINATION:  GENERAL:  64 y.o.-year-old patient lying in the bed with no acute distress.  EYES: Pupils equal, round, reactive to light and accommodation. No scleral icterus. Extraocular muscles intact.  HEENT: Head atraumatic, normocephalic. Oropharynx and nasopharynx clear.  NECK:  Supple, no jugular venous distention. No thyroid enlargement, no tenderness.  LUNGS: Decreased breath sounds bilaterally, has wheezing, No rales,rhonchi or crepitation. No use of accessory muscles of respiration.  CARDIOVASCULAR: S1, S2 normal. No murmurs, rubs, or gallops.  ABDOMEN: Soft, nontender, nondistended. Bowel sounds present. No  organomegaly or mass.  EXTREMITIES: No pedal edema, cyanosis, or clubbing.  NEUROLOGIC: Cranial nerves II through XII are intact. Muscle strength 5/5 in all extremities. Sensation intact. Gait not checked.  PSYCHIATRIC: The patient is alert and oriented x 3.  SKIN: No obvious rash, lesion, or ulcer.    LABORATORY PANEL:   CBC  Recent Labs Lab 05/24/14 0612  WBC 16.3*  HGB 13.5  HCT 42.2  PLT 237   ------------------------------------------------------------------------------------------------------------------  Chemistries   Recent Labs Lab 05/24/14 0612  NA 139  K 4.2  CL 105  CO2 26  GLUCOSE 170*  BUN 17  CREATININE 0.79  CALCIUM 9.3   ------------------------------------------------------------------------------------------------------------------  RADIOLOGY:  Dg Chest 2 View (if Patient Has Fever And/or Copd)  05/23/2014   CLINICAL DATA:  Shortness of breath since Sunday  EXAM: CHEST  2 VIEW  COMPARISON:  None.  FINDINGS: The heart size and mediastinal contours are within normal limits. Both lungs are clear. The visualized skeletal structures are unremarkable.  IMPRESSION: No active cardiopulmonary disease.   Electronically Signed   By: Kathreen Devoid   On: 05/23/2014 13:36    ASSESSMENT AND PLAN:   64 year old female with a history of chronic respiratory failure on 2 L of oxygen, COPD and CAD who presents with acute on chronic respiratory failure.  1. Acute on chronic respiratory failure due to acute COPD exacerbation:  Continue IV steroids, Oxygen, azithromycin, Nebs and outpatient Inhalers.  2. Acute on chronic COPD exacerbation: she is on 2 L of oxygen at home. Now at 3 liters, continue to wean oxygen level to her baseline. Continue theophylline. Will check ambulatory pulse Ox.  3.. Coronary artery disease status post stent: continue her outpatient medications including Plavix, aspirin, nitroglycerin when  necessary and simvastatin.  4. Hypertension: continue  lisinopril. monitor blood pressure.  5. Hyperlipidemia: Continue Zocor.   All the records are reviewed and case discussed with Care Management/Social Workerr. Management plans discussed with the patient, family and they are in agreement.  CODE STATUS: Full Code  TOTAL TIME TAKING CARE OF THIS PATIENT: 35 minutes.   POSSIBLE D/C IN 1-2 DAYS, DEPENDING ON CLINICAL CONDITION.   [facility], Lorayne Getchell M.D on 05/24/2014 at 1:14 PM  Between 7am to 6pm - Pager - [phone]  After 6pm go to [email or web address] - password [email or web address] [facility]  Office  [phone]  CC: Primary care physician; Lorelee Market, MD

## 2014-05-24 NOTE — Plan of Care (Signed)
Problem: Discharge Progression Outcomes Goal: Discharge plan in place and appropriate Individualization  Pt prefers to be called Dawn Donaldson Hx: HTN, COPD, & chronic back pain controlled by home medications. Moderate fall precautions requiring +1 assistance when out of bed for safety. Goal: Other Discharge Outcomes/Goals 1. Dyspnea controlled: pt SOB w/ exertion continuing to require oxygen  2. O2 sats: oxygen saturations at 96% on 3L with scheduled nebs all managed by RT 3. Home O2: pt currently has home oxygen which she wears during the night  4. Barriers to progression: none identified during the night  5. Pain controlled: pain relived temporarily by PRN Oxy IR. Pt states she goes to a pain clinic and normally takes more than the prescribed amount at home. 6. Activity: pt is very independent but requires +1 assistance out of bed due to getting SOB with exertion  7. Complications: pt c/o nausea x2 during the night which was relieved by PRN zofran but states this is not a new problem and takes zofran at home to relieve it  No fall or injury this shift. Will continue to assess

## 2014-05-25 ENCOUNTER — Inpatient Hospital Stay: Payer: Medicare Other

## 2014-05-25 MED ORDER — ALBUTEROL SULFATE (2.5 MG/3ML) 0.083% IN NEBU: 3.0000 mL | INHALATION_SOLUTION | RESPIRATORY_TRACT | Status: DC | PRN

## 2014-05-25 MED ORDER — SODIUM CHLORIDE 0.9 % IJ SOLN
3.0000 mL | INTRAMUSCULAR | Status: DC | PRN
  Administered 2014-05-25: 14:00:00 3 mL via INTRAVENOUS
  Filled 2014-05-25: qty 10

## 2014-05-25 NOTE — Progress Notes (Signed)
[place] at [place] NAME: Dawn Donaldson    MR#:  [id number]  DATE OF BIRTH:  11/16/1950  SUBJECTIVE:  Feels some better since admission. Has had COPD for more than 6 years and has been on 2 L oxygen at home. Remains on 3-4 L O2 now. Continues to wheeze. Nausea during nighttime. Episode of shortness of breath this a.m.  REVIEW OF SYSTEMS:  Review of Systems  Constitutional: Negative for fever and chills.  Respiratory: Positive for shortness of breath and wheezing. Negative for cough.   Cardiovascular: Negative for chest pain and palpitations.  Gastrointestinal: Positive for nausea. Negative for vomiting, abdominal pain, diarrhea and constipation.  Genitourinary: Negative for dysuria.  Neurological: Negative for dizziness, seizures and headaches.    DRUG ALLERGIES:   Allergies  Allergen Reactions  . Dexlansoprazole Nausea And Vomiting  . Ultram [Tramadol] Itching    VITALS:  Blood pressure 136/67, pulse 81, temperature 98.1 F (36.7 C), temperature source Oral, resp. rate 20, height 5\' 2"  (1.575 m), weight 90.719 kg (200 lb), SpO2 96 %.  PHYSICAL EXAMINATION:  GENERAL:  64 y.o.-year-old patient lying in the bed with no acute distress.  EYES: Pupils equal, round, reactive to light and accommodation. No scleral icterus. Extraocular muscles intact.  HEENT: Head atraumatic, normocephalic. Oropharynx and nasopharynx clear.  NECK:  Supple, no jugular venous distention. No thyroid enlargement, no tenderness.  LUNGS: Decreased breath sounds bilaterally, has bilateral expiratory wheezing, No rales,rhonchi or crepitation. No use of accessory muscles of respiration.  CARDIOVASCULAR: S1, S2 normal. No murmurs, rubs, or gallops.  ABDOMEN: Soft, nontender, nondistended. Bowel sounds present. No organomegaly or mass.  EXTREMITIES: No pedal edema, cyanosis, or clubbing.  NEUROLOGIC: Cranial nerves II through XII are intact. Muscle strength 5/5 in  all extremities. Sensation intact. Gait not checked.  PSYCHIATRIC: The patient is alert and oriented x 3.  SKIN: No obvious rash, lesion, or ulcer.    LABORATORY PANEL:   CBC  Recent Labs Lab 05/24/14 0612  WBC 16.3*  HGB 13.5  HCT 42.2  PLT 237   ------------------------------------------------------------------------------------------------------------------  Chemistries   Recent Labs Lab 05/24/14 0612  NA 139  K 4.2  CL 105  CO2 26  GLUCOSE 170*  BUN 17  CREATININE 0.79  CALCIUM 9.3   ------------------------------------------------------------------------------------------------------------------  RADIOLOGY:  Dg Chest 2 View (if Patient Has Fever And/or Copd)  05/23/2014   CLINICAL DATA:  Shortness of breath since Sunday  EXAM: CHEST  2 VIEW  COMPARISON:  None.  FINDINGS: The heart size and mediastinal contours are within normal limits. Both lungs are clear. The visualized skeletal structures are unremarkable.  IMPRESSION: No active cardiopulmonary disease.   Electronically Signed   By: Kathreen Devoid   On: 05/23/2014 13:36    ASSESSMENT AND PLAN:   64 year old female with a history of chronic respiratory failure on 2 L of oxygen, COPD and CAD who presents with acute on chronic respiratory failure.  1. Acute on chronic respiratory failure due to acute COPD exacerbation:  Continue IV steroids-currently Solu-Medrol IV every 8 hours, on 3-4 L Oxygen, azithromycin, Nebs and outpatient Inhalers. Discontinue azithromycin after 5 days. Continue to wean oxygen to 2 L. Encourage ambulation. Continue theophylline. Follows with Dr. Vella Kohler as an outpatient. Follow-up chest x-ray today.  2.. Coronary artery disease status post stent: continue her outpatient medications including Plavix, aspirin, nitroglycerin when necessary and simvastatin.  3. Hypertension: continue lisinopril. monitor blood pressure.  4. Hyperlipidemia: Continue Zocor.  5. Congestive heart failure-  unknown ejection fraction. Likely diastolic dysfunction. Continue Lasix daily. Follow up chest x-ray.  6. DVT prophylaxis-on subcutaneous heparin  7. Leukocytosis-likely secondary to being on steroids.   All the records are reviewed and case discussed with Care Management/Social Workerr. Management plans discussed with the patient, family and they are in agreement.  CODE STATUS: Full Code  TOTAL TIME TAKING CARE OF THIS PATIENT: 35 minutes.   POSSIBLE D/C IN 2 DAYS, DEPENDING ON CLINICAL CONDITION.   Gladstone Lighter M.D on 05/25/2014 at 7:18 AM  Between 7am to 6pm - Pager - [phone]  After 6pm go to [email or web address] - password [email or web address] [facility]  Office  [phone]  CC: Primary care physician; Lorelee Market, MD

## 2014-05-25 NOTE — Progress Notes (Signed)
Dawn Donaldson has chronic home oxygen normally at 2L N/C. Depending upon progress during this hospitalization, could possibly be discharged with a higher amount of oxygen or require a home nebulizer. Case Management will follow for discharge planning and DME needs at discharge.

## 2014-05-25 NOTE — Progress Notes (Signed)
Pt sitting up in chair in room. Respiration easy/even.

## 2014-05-25 NOTE — Plan of Care (Signed)
Problem: Discharge Progression Outcomes Goal: Other Discharge Outcomes/Goals Patient had no c/o pain this shift Patient vital were stable  Patient still has some dyspnea on exhertion  Patient remains on 3 L o2 with sats in the high 90's

## 2014-05-25 NOTE — Plan of Care (Signed)
Problem: Discharge Progression Outcomes Goal: Discharge plan in place and appropriate Individualization  Pt prefers to be called Dawn Donaldson Hx: HTN, COPD, & chronic back pain controlled by home medications. Moderate fall precautions requiring +1 assistance when out of bed for safety.    Goal: Other Discharge Outcomes/Goals Plan of care progress to goals: 1. Dyspnea controlled: pt SOB w/ exertion continuing to require oxygen   2. O2 sats: oxygen saturations at 98% on 3L with scheduled nebs all managed by RT 3. Home O2: pt currently has home oxygen which she wears during the night    5. Pain controlled: pain relived temporarily by PRN Oxy IR.  6. Activity: pt is very independent but requires +1 assistance out of bed due to getting SOB with exertion   7. Complications: pt c/o nausea uring the night which was relieved by PRN zofran but states this is not a new problem and takes zofran at home to relieve it   No fall or injury this shift. Will continue to assess

## 2014-05-25 NOTE — Plan of Care (Signed)
Problem: Discharge Progression Outcomes Goal: Other Discharge Outcomes/Goals Outcome: Progressing 1. Education done on MDI use and Nebulizer use; pt wanting to use her albuterol MDI today in addition to her nebs and other meds. Corrective education done. 2. Sats 97% on 3-4l 02/[place]. 3.Has home 02. 4. Performs self ADL/s independently. 5. Chronic back pain controlled with oxycodone with zofran to prevent nausea. 6. Diet- reeducated on heart healthy diet with pt eating inappropriate foods brought in by family. Noncompliant. 7. Complications controlled- had audible wheezing without distress this a.m which has now resolved. 8. Pt has stayed primarily at bedrest except BRP. Encouraged to sit up on side of bed/sit in chair and walk around bed with 02 as tolerated.

## 2014-05-26 MED ORDER — OXYCODONE HCL 5 MG PO TABS
10.0000 mg | ORAL_TABLET | ORAL | Status: DC | PRN
  Administered 2014-05-26 – 2014-05-27 (×4): 10 mg via ORAL
  Filled 2014-05-26 (×4): qty 2

## 2014-05-26 MED ORDER — LORAZEPAM 1 MG PO TABS
1.0000 mg | ORAL_TABLET | Freq: Four times a day (QID) | ORAL | Status: DC | PRN
  Administered 2014-05-26 (×2): 1 mg via ORAL
  Filled 2014-05-26 (×2): qty 1

## 2014-05-26 NOTE — Plan of Care (Signed)
Problem: Discharge Progression Outcomes Goal: Other Discharge Outcomes/Goals Outcome: Progressing Individualization Outcome: Progressing Address patient as Dawn Donaldson. Patient is from home. Patient is a current smoker. Patient has history of COPD, HTN, MI, PVD, controlled with home medications. Plan of Care Progression to Goal:  Pt still experiencing exp wheezes.  Got SOB when walking around room.  Rec'd ativan for anxiety. Rec'd pain med 2x for chronic back pain. Pain med inc from q6 to q4 hrs. Pt is moderate fall - ambulates independently.  Possible d/c tomorrow.

## 2014-05-26 NOTE — Progress Notes (Signed)
[facility] at [place] NAME: Dawn Donaldson    MR#:  [id number]  DATE OF BIRTH:  07-26-1950  SUBJECTIVE:  -Feels some better since admission.  -Has had COPD for more than 6 years and has been on 2 L oxygen at home.  -Remains on 3 L O2 now. - Continues to wheeze.   REVIEW OF SYSTEMS:  ROS   Constitutional: Negative for fever and chills.  Respiratory: Positive for shortness of breath and wheezing. Negative for cough.  Cardiovascular: Negative for chest pain and palpitations.  Gastrointestinal: Positive for nausea. Negative for vomiting, abdominal pain, diarrhea and constipation.  Genitourinary: Negative for dysuria.  Neurological: Negative for dizziness, seizures and headaches.   DRUG ALLERGIES:   Allergies  Allergen Reactions  . Dexlansoprazole Nausea And Vomiting  . Ultram [Tramadol] Itching    VITALS:  Blood pressure 126/70, pulse 77, temperature 98.5 F (36.9 C), temperature source Oral, resp. rate 18, height 5\' 2"  (1.575 m), weight 90.719 kg (200 lb), SpO2 95 %.  PHYSICAL EXAMINATION:  GENERAL:  64 y.o.-year-old patient lying in the bed with no acute distress.  EYES: Pupils equal, round, reactive to light and accommodation. No scleral icterus. Extraocular muscles intact.  HEENT: Head atraumatic, normocephalic. Oropharynx and nasopharynx clear.  NECK:  Supple, no jugular venous distention. No thyroid enlargement, no tenderness.  LUNGS: Decreased breath sounds bilaterally, has bilateral expiratory wheezing, No rales,rhonchi or crepitation. No use of accessory muscles of respiration.  CARDIOVASCULAR: S1, S2 normal. No murmurs, rubs, or gallops.  ABDOMEN: Soft, nontender, nondistended. Bowel sounds present. No organomegaly or mass.  EXTREMITIES: No pedal edema, cyanosis, or clubbing.  NEUROLOGIC: Cranial nerves II through XII are intact. Muscle strength 5/5 in all extremities. Sensation intact. Gait not checked.  PSYCHIATRIC: The  patient is alert and oriented x 3.  SKIN: No obvious rash, lesion, or ulcer.    LABORATORY PANEL:   CBC  Recent Labs Lab 05/24/14 0612  WBC 16.3*  HGB 13.5  HCT 42.2  PLT 237   ------------------------------------------------------------------------------------------------------------------  Chemistries   Recent Labs Lab 05/24/14 0612  NA 139  K 4.2  CL 105  CO2 26  GLUCOSE 170*  BUN 17  CREATININE 0.79  CALCIUM 9.3   ------------------------------------------------------------------------------------------------------------------  RADIOLOGY:  Dg Chest 2 View  05/25/2014   CLINICAL DATA:  64 year old female with hypoxia and shortness of breath  EXAM: CHEST  2 VIEW  COMPARISON:  Prior chest x-ray 05/23/2014  FINDINGS: The lungs are clear and negative for focal airspace consolidation, pulmonary edema or suspicious pulmonary nodule. No pleural effusion or pneumothorax. Stable mild central bronchitic change and pulmonary hyperinflation. Cardiac and mediastinal contours are within normal limits. No acute fracture or lytic or blastic osseous lesions. The visualized upper abdominal bowel gas pattern is unremarkable. Surgical clips in the right upper quadrant suggest prior cholecystectomy.  IMPRESSION: No active cardiopulmonary disease.   Electronically Signed   By: Jacqulynn Cadet M.D.   On: 05/25/2014 10:10    ASSESSMENT AND PLAN:   64 year old female with a history of chronic respiratory failure on 2 L of oxygen, COPD and CAD who presents with acute on chronic respiratory failure.  1. Acute on chronic respiratory failure due to acute COPD exacerbation:  Continue IV steroids-currently Solu-Medrol IV every 8 hours,  -on 3-4 L Oxygen, azithromycin x 5 days, Nebs and outpatient Inhalers. -Continue to wean oxygen to 2 L. Encourage ambulation. Continue theophylline. Follows with Dr. Vella Kohler as an outpatient.  -  Chest x-ray with no acute changes.  2.. Coronary artery disease  status post stent: continue her outpatient medications including Plavix, aspirin, nitroglycerin when necessary and simvastatin.  3. Hypertension: continue lisinopril. monitor blood pressure.  4. Hyperlipidemia: Continue Zocor.  5. Congestive heart failure- unknown ejection fraction. Likely diastolic dysfunction. Continue Lasix daily. Follow up chest x-ray.  6. DVT prophylaxis-on subcutaneous heparin  7. Leukocytosis-likely secondary to being on steroids.   All the records are reviewed and case discussed with Care Management/Social Workerr. Management plans discussed with the patient, family and they are in agreement.  CODE STATUS: Full Code  TOTAL TIME TAKING CARE OF THIS PATIENT: 35 minutes.   POSSIBLE D/C TOMORROW, DEPENDING ON CLINICAL CONDITION.   Gladstone Lighter M.D on 05/26/2014 at 11:15 AM  Between 7am to 6pm - Pager - [phone]  After 6pm go to [email or web address] - password [email or web address] [facility]  Office  [phone]  CC: Primary care physician; Lorelee Market, MD

## 2014-05-27 ENCOUNTER — Encounter: Payer: Self-pay | Admitting: Internal Medicine

## 2014-05-27 LAB — PLATELET COUNT: PLATELETS: 218 10*3/uL (ref 150–440)

## 2014-05-27 MED ORDER — ALBUTEROL SULFATE (2.5 MG/3ML) 0.083% IN NEBU: 3.0000 mL | INHALATION_SOLUTION | RESPIRATORY_TRACT | Status: DC | PRN

## 2014-05-27 MED ORDER — COMBIVENT RESPIMAT 20-100 MCG/ACT IN AERS: 1.0000 | INHALATION_SPRAY | Freq: Four times a day (QID) | RESPIRATORY_TRACT | Status: AC

## 2014-05-27 MED ORDER — LORAZEPAM 1 MG PO TABS: 1.0000 mg | ORAL_TABLET | Freq: Three times a day (TID) | ORAL | Status: DC | PRN

## 2014-05-27 MED ORDER — OXYCODONE HCL 10 MG PO TABS: 10.0000 mg | ORAL_TABLET | Freq: Four times a day (QID) | ORAL | Status: DC | PRN

## 2014-05-27 MED ORDER — ONDANSETRON HCL 4 MG PO TABS: 4.0000 mg | ORAL_TABLET | ORAL | Status: DC | PRN

## 2014-05-27 MED ORDER — ONDANSETRON HCL 4 MG/2ML IJ SOLN
4.0000 mg | INTRAMUSCULAR | Status: DC | PRN
  Administered 2014-05-27: 03:00:00 4 mg via INTRAVENOUS

## 2014-05-27 MED ORDER — TIOTROPIUM BROMIDE MONOHYDRATE 18 MCG IN CAPS: 18.0000 ug | ORAL_CAPSULE | Freq: Every day | RESPIRATORY_TRACT | Status: DC

## 2014-05-27 MED ORDER — ONDANSETRON HCL 4 MG/2ML IJ SOLN: 4.0000 mg | Freq: Four times a day (QID) | INTRAMUSCULAR | Status: DC | PRN

## 2014-05-27 MED ORDER — ONDANSETRON HCL 4 MG PO TABS: 4.0000 mg | ORAL_TABLET | Freq: Four times a day (QID) | ORAL | Status: DC | PRN

## 2014-05-27 MED ORDER — PREDNISONE 10 MG PO TABS: ORAL_TABLET | ORAL | Status: DC

## 2014-05-27 NOTE — Discharge Summary (Signed)
[facility] at [place] NAME: Dawn Donaldson    MR#:  [id number]  DATE OF BIRTH:  1950/08/25  DATE OF ADMISSION:  05/23/2014 ADMITTING PHYSICIAN: Bettey Costa, MD  DATE OF DISCHARGE: No discharge date for patient encounter.  PRIMARY CARE PHYSICIAN: Lorelee Market, MD    ADMISSION DIAGNOSIS:  Chronic obstructive pulmonary disease with acute exacerbation [J44.1]  DISCHARGE DIAGNOSIS:  Active Problems:   COPD (chronic obstructive pulmonary disease)   SECONDARY DIAGNOSIS:   Past Medical History  Diagnosis Date  . COPD (chronic obstructive pulmonary disease)   . Hypertension   CAD Hyperlipidemia DIastolic CHF  HOSPITAL COURSE:   64 year old female with a history of chronic respiratory failure on 2 L of oxygen, COPD and CAD who presents with acute on chronic respiratory failure.  1. Acute on chronic respiratory failure due to acute COPD exacerbation: On IV steroids-currently Solu-Medrol IV every 8 hours- change to prednisone taper  -on 3-4 L Oxygen, azithromycin x 5 days- will finish on the day of discharge, Nebs and outpatient Inhalers. spiriva added -Continue to wean oxygen to 2-3 L. Encourage ambulation. Continue theophylline. Follows with Dr. Vella Kohler as an outpatient.  -Chest x-ray with no acute changes.  2.. Coronary artery disease status post stent: continue her outpatient medications including Plavix, aspirin, nitroglycerin when necessary and simvastatin.  3. Hypertension: continue lisinopril. monitor blood pressure.  4. Hyperlipidemia: Continue Zocor.  5. Congestive heart failure- unknown ejection fraction. Likely diastolic dysfunction. Continue Lasix daily. Follow up chest x-ray with no acute findings.  6. DVT prophylaxis-on subcutaneous heparin  7. Leukocytosis-likely secondary to being on steroids.  DISCHARGE CONDITIONS:   stable  CONSULTS OBTAINED:     DRUG ALLERGIES:   Allergies  Allergen Reactions   . Dexlansoprazole Nausea And Vomiting  . Ultram [Tramadol] Itching    DISCHARGE MEDICATIONS:   Current Discharge Medication List    START taking these medications   Details  albuterol (PROVENTIL) (2.5 MG/3ML) 0.083% nebulizer solution Inhale 3 mLs into the lungs every 4 (four) hours as needed for wheezing or shortness of breath. Qty: 75 mL, Refills: 1    tiotropium (SPIRIVA HANDIHALER) 18 MCG inhalation capsule Place 1 capsule (18 mcg total) into inhaler and inhale daily. Qty: 30 capsule, Refills: 1      CONTINUE these medications which have CHANGED   Details  COMBIVENT RESPIMAT 20-100 MCG/ACT AERS respimat Inhale 1 puff into the lungs 4 (four) times daily. Qty: 1 Inhaler, Refills: 2    LORazepam (ATIVAN) 1 MG tablet Take 1 tablet (1 mg total) by mouth every 8 (eight) hours as needed for anxiety. Qty: 20 tablet, Refills: 0    ondansetron (ZOFRAN) 4 MG tablet Take 1 tablet (4 mg total) by mouth every 6 (six) hours as needed for nausea or vomiting. Qty: 20 tablet, Refills: 0    Oxycodone HCl 10 MG TABS Take 1 tablet (10 mg total) by mouth every 6 (six) hours as needed. Qty: 20 tablet, Refills: 0    predniSONE (DELTASONE) 10 MG tablet 6 tabs PO x 2 days 5 tabs PO x 2 days 4 tabs PO x 2 days 3 tabs PO x 2 days 2 tabs PO x 2 days 1 tab PO x 2 days and stop Qty: 42 tablet, Refills: 0      CONTINUE these medications which have NOT CHANGED   Details  aspirin EC 81 MG tablet Take 81 mg by mouth daily.     clopidogrel (PLAVIX) 75  MG tablet Take 75 mg by mouth daily.     furosemide (LASIX) 40 MG tablet Take 40 mg by mouth daily.     lisinopril (PRINIVIL,ZESTRIL) 20 MG tablet Take 20 mg by mouth daily.     NEXIUM 40 MG capsule Take 40 mg by mouth 2 (two) times daily.     nitroGLYCERIN (NITROSTAT) 0.4 MG SL tablet Place 0.4 mg under the tongue every 5 (five) minutes as needed.     simvastatin (ZOCOR) 20 MG tablet Take 20 mg by mouth daily.     theophylline (THEODUR) 100  MG 12 hr tablet Take 100 mg by mouth 2 (two) times daily.       STOP taking these medications     albuterol (PROVENTIL HFA;VENTOLIN HFA) 108 (90 BASE) MCG/ACT inhaler      levofloxacin (LEVAQUIN) 750 MG tablet          DISCHARGE INSTRUCTIONS:   On 3L nasal cannula  If you experience worsening of your admission symptoms, develop shortness of breath, life threatening emergency, suicidal or homicidal thoughts you must seek medical attention immediately by calling 911 or calling your MD immediately  if symptoms less severe.  You Must read complete instructions/literature along with all the possible adverse reactions/side effects for all the Medicines you take and that have been prescribed to you. Take any new Medicines after you have completely understood and accept all the possible adverse reactions/side effects.   Please note  You were cared for by a hospitalist during your hospital stay. If you have any questions about your discharge medications or the care you received while you were in the hospital after you are discharged, you can call the unit and asked to speak with the hospitalist on call if the hospitalist that took care of you is not available. Once you are discharged, your primary care physician will handle any further medical issues. Please note that NO REFILLS for any discharge medications will be authorized once you are discharged, as it is imperative that you return to your primary care physician (or establish a relationship with a primary care physician if you do not have one) for your aftercare needs so that they can reassess your need for medications and monitor your lab values.    Today   CHIEF COMPLAINT:   Chief Complaint  Patient presents with  . Shortness of Breath    VITAL SIGNS:  Blood pressure 140/72, pulse 79, temperature 98.2 F (36.8 C), temperature source Oral, resp. rate 18, height 5\' 2"  (1.575 m), weight 90.719 kg (200 lb), SpO2 96 %.  I/O:    Intake/Output Summary (Last 24 hours) at 05/27/14 1201 Last data filed at 05/27/14 0900  Gross per 24 hour  Intake    480 ml  Output      0 ml  Net    480 ml    PHYSICAL EXAMINATION:   Physical Exam  GENERAL: 64 y.o.-year-old patient lying in the bed with no acute distress.  EYES: Pupils equal, round, reactive to light and accommodation. No scleral icterus. Extraocular muscles intact.  HEENT: Head atraumatic, normocephalic. Oropharynx and nasopharynx clear.  NECK: Supple, no jugular venous distention. No thyroid enlargement, no tenderness.  LUNGS: Improved breath sounds bilaterally, has scattered wheezing today, No rales,rhonchi or crepitation. No use of accessory muscles of respiration.  CARDIOVASCULAR: S1, S2 normal. No murmurs, rubs, or gallops.  ABDOMEN: Soft, nontender, nondistended. Bowel sounds present. No organomegaly or mass.  EXTREMITIES: No pedal edema, cyanosis, or clubbing.  NEUROLOGIC: Cranial nerves II through XII are intact. Muscle strength 5/5 in all extremities. Sensation intact. Gait not checked.  PSYCHIATRIC: The patient is alert and oriented x 3.  SKIN: No obvious rash, lesion, or ulcer.   DATA REVIEW:   CBC  Recent Labs Lab 05/24/14 0612 05/27/14 0604  WBC 16.3*  --   HGB 13.5  --   HCT 42.2  --   PLT 237 218    Chemistries   Recent Labs Lab 05/24/14 0612  NA 139  K 4.2  CL 105  CO2 26  GLUCOSE 170*  BUN 17  CREATININE 0.79  CALCIUM 9.3    Cardiac Enzymes  Recent Labs Lab 05/23/14 1253  [place] <0.03    Microbiology Results  Results for orders placed or performed in visit on 12/31/12  Influenza A&B Antigens [facility])     Status: None   Collection Time: 12/31/12 11:10 AM  Result Value Ref Range Status   Micro Text Report   Final       COMMENT                   NEGATIVE FOR INFLUENZA A (ANTIGEN ABSENT)   COMMENT                   NEGATIVE FOR INFLUENZA B (ANTIGEN ABSENT)   ANTIBIOTIC                                                       Culture, expectorated sputum-assessment     Status: None   Collection Time: 01/02/13  8:00 AM  Result Value Ref Range Status   Micro Text Report   Final       COMMENT                   APPEARS TO BE NORMAL FLORA AT 48 HOURS   GRAM STAIN                EXCELLENT SPECIMEN-90-100% WBC   GRAM STAIN                MANY WHITE BLOOD CELLS   GRAM STAIN                FEW GRAM POSITIVE COCCI, RARE GRAM POSITIVE ROD,   GRAM STAIN                RARE GRAM NEGATIVE DIPLOCOCCI   ANTIBIOTIC                                                        RADIOLOGY:  No results found.  EKG:   Orders placed or performed during the hospital encounter of 05/23/14  . ED EKG  (if patient has PMH of COPD)  . ED EKG  (if patient has PMH of COPD)  . EKG 12-Lead  . EKG 12-Lead  . EKG      Management plans discussed with the patient, family and they are in agreement.  CODE STATUS:     Code Status Orders        Start     Ordered   05/23/14 [phone]  Full code   Continuous     05/23/14 1602      TOTAL TIME TAKING CARE OF THIS PATIENT: 38 minutes.    Gladstone Lighter M.D on 05/27/2014 at 12:01 PM  Between 7am to 6pm - Pager - [phone]  After 6pm go to [email or web address] - password [email or web address] [facility]  Office  [phone]  CC: Primary care physician; Lorelee Market, MD

## 2014-05-27 NOTE — Progress Notes (Signed)
[facility] at [place] NAME: Christen Wardrop    MR#:  [id number]  DATE OF BIRTH:  [birth year]  SUBJECTIVE:  Breathing much better, scattered wheeze only. Able to ambulate.  REVIEW OF SYSTEMS:  ROS   Constitutional: Negative for fever and chills.  Respiratory: Positive for shortness of breath and wheezing- but much improved. Negative for cough.  Cardiovascular: Negative for chest pain and palpitations.  Gastrointestinal: Positive for nausea at nights. Negative for vomiting, abdominal pain, diarrhea and constipation.  Genitourinary: Negative for dysuria.  Neurological: Negative for dizziness, seizures and headaches.   DRUG ALLERGIES:   Allergies  Allergen Reactions  . Dexlansoprazole Nausea And Vomiting  . Ultram [Tramadol] Itching    VITALS:  Blood pressure 140/72, pulse 79, temperature 98.2 F (36.8 C), temperature source Oral, resp. rate 18, height 5\' 2"  (1.575 m), weight 90.719 kg (200 lb), SpO2 96 %.  PHYSICAL EXAMINATION:  GENERAL:  64 y.o.-year-old patient lying in the bed with no acute distress.  EYES: Pupils equal, round, reactive to light and accommodation. No scleral icterus. Extraocular muscles intact.  HEENT: Head atraumatic, normocephalic. Oropharynx and nasopharynx clear.  NECK:  Supple, no jugular venous distention. No thyroid enlargement, no tenderness.  LUNGS: Improved breath sounds bilaterally, has scattered wheezing today, No rales,rhonchi or crepitation. No use of accessory muscles of respiration.  CARDIOVASCULAR: S1, S2 normal. No murmurs, rubs, or gallops.  ABDOMEN: Soft, nontender, nondistended. Bowel sounds present. No organomegaly or mass.  EXTREMITIES: No pedal edema, cyanosis, or clubbing.  NEUROLOGIC: Cranial nerves II through XII are intact. Muscle strength 5/5 in all extremities. Sensation intact. Gait not checked.  PSYCHIATRIC: The patient is alert and oriented x 3.  SKIN: No obvious rash, lesion, or  ulcer.    LABORATORY PANEL:   CBC  Recent Labs Lab 05/24/14 0612 05/27/14 0604  WBC 16.3*  --   HGB 13.5  --   HCT 42.2  --   PLT 237 218   ------------------------------------------------------------------------------------------------------------------  Chemistries   Recent Labs Lab 05/24/14 0612  NA 139  K 4.2  CL 105  CO2 26  GLUCOSE 170*  BUN 17  CREATININE 0.79  CALCIUM 9.3   ------------------------------------------------------------------------------------------------------------------  RADIOLOGY:  No results found.  ASSESSMENT AND PLAN:   64 year old female with a history of chronic respiratory failure on 2 L of oxygen, COPD and CAD who presents with acute on chronic respiratory failure.  1. Acute on chronic respiratory failure due to acute COPD exacerbation:  On IV steroids-currently Solu-Medrol IV every 8 hours- change to prednisone taper  -on 3-4 L Oxygen, azithromycin x 5 days, Nebs and outpatient Inhalers. -Continue to wean oxygen to 2 L. Encourage ambulation. Continue theophylline. Follows with Dr. Vella Kohler as an outpatient.  -Chest x-ray with no acute changes.  2.. Coronary artery disease status post stent: continue her outpatient medications including Plavix, aspirin, nitroglycerin when necessary and simvastatin.  3. Hypertension: continue lisinopril. monitor blood pressure.  4. Hyperlipidemia: Continue Zocor.  5. Congestive heart failure- unknown ejection fraction. Likely diastolic dysfunction. Continue Lasix daily. Follow up chest x-ray with no acute findings.  6. DVT prophylaxis-on subcutaneous heparin  7. Leukocytosis-likely secondary to being on steroids.   All the records are reviewed and case discussed with Care Management/Social Workerr. Management plans discussed with the patient, family and they are in agreement.  CODE STATUS: Full Code  TOTAL TIME TAKING CARE OF THIS PATIENT: 35 minutes.   POSSIBLE D/C  TODAY, DEPENDING  ON  CLINICAL CONDITION.   Gladstone Lighter M.D on 05/27/2014 at 11:39 AM  Between 7am to 6pm - Pager - [phone]  After 6pm go to [email or web address] - password [email or web address] [facility]  Office  [phone]  CC: Primary care physician; Lorelee Market, MD

## 2014-05-27 NOTE — Care Management Note (Signed)
Case Management Note  Patient Details  Name: Samyrah Bruster MRN: [id number] Date of Birth: [birth year]  Subjective/Objective:          Discussed home health planning with Mrs Roniqua Kintz.   States that she has chronic oxygen at home and has a portable oxygen tank. States "I use a cane when I need it, I really don't need anything else." Currently ambulating in hospital room and to bathroom without oxygen.        Action/Plan:   Expected Discharge Date:  05/26/14               Expected Discharge Plan:     In-House Referral:     Discharge planning Services     Post Acute Care Choice:    Choice offered to:     DME Arranged:    DME Agency:     HH Arranged:    [place] Agency:     Status of Service:     Medicare Important Message Given:    Date Medicare IM Given:    Medicare IM give by:    Date Additional Medicare IM Given:    Additional Medicare Important Message give by:     If discussed at [place] of Stay Meetings, dates discussed:    Additional Comments:  Gunnison Chahal A, RN 05/27/2014, 1:20 PM

## 2014-05-27 NOTE — Discharge Instructions (Signed)
°  DIET:  Cardiac diet  DISCHARGE CONDITION:  Stable  ACTIVITY:  Activity as tolerated  OXYGEN:  Home Oxygen: Yes.     Oxygen Delivery: 3 l O2 via Patient connected to nasal cannula oxygen  DISCHARGE LOCATION:  home   If you experience worsening of your admission symptoms, develop shortness of breath, life threatening emergency, suicidal or homicidal thoughts you must seek medical attention immediately by calling 911 or calling your MD immediately  if symptoms less severe.  You Must read complete instructions/literature along with all the possible adverse reactions/side effects for all the Medicines you take and that have been prescribed to you. Take any new Medicines after you have completely understood and accpet all the possible adverse reactions/side effects.   Please note  You were cared for by a hospitalist during your hospital stay. If you have any questions about your discharge medications or the care you received while you were in the hospital after you are discharged, you can call the unit and asked to speak with the hospitalist on call if the hospitalist that took care of you is not available. Once you are discharged, your primary care physician will handle any further medical issues. Please note that NO REFILLS for any discharge medications will be authorized once you are discharged, as it is imperative that you return to your primary care physician (or establish a relationship with a primary care physician if you do not have one) for your aftercare needs so that they can reassess your need for medications and monitor your lab values.

## 2014-05-27 NOTE — Plan of Care (Signed)
Problem: Discharge Progression Outcomes Goal: Other Discharge Outcomes/Goals Outcome: Progressing Address patient as Dawn Donaldson. Patient is from home. Patient is a current smoker. Patient has history of COPD, HTN, MI, PVD, controlled with home medications. Moderate Fall Risk  Patient is alert and oriented, c/o chronic back pain. Oxycodone with some improvement. Feeling nauseous, Zofran given. Continues to wear 2 L of oxygen.

## 2014-05-27 NOTE — Care Management Note (Signed)
Case Management Note  Patient Details  Name: Dawn Donaldson MRN: [id number] Date of Birth: May 03, 1950  Subjective/Objective:                    Action/Plan:   Expected Discharge Date:  05/26/14               Expected Discharge Plan:     In-House Referral:     Discharge planning Services     Post Acute Care Choice:    Choice offered to:     DME Arranged:    DME Agency:     HH Arranged:    [place] Agency:     Status of Service:     Medicare Important Message Given:    YesDate Medicare IM Given:   05/27/14 Medicare IM give by:   Hester Mates, RN Date Additional Medicare IM Given:    Additional Medicare Important Message give by:     If discussed at [place] of Stay Meetings, dates discussed:    Additional Comments:  Muneeb Veras A, RN 05/27/2014, 8:50 AM

## 2014-05-27 NOTE — Progress Notes (Signed)
MD notified patient unable to wait for next Zofran dose. Request to change from every 6 hours to every 4 hours. MD approved.

## 2014-09-18 ENCOUNTER — Inpatient Hospital Stay
Admission: EM | Admit: 2014-09-18 | Discharge: 2014-09-22 | DRG: 190 | Disposition: A | Discharge status: Home / Self Care | Payer: Medicare Other | Attending: Internal Medicine | Admitting: Internal Medicine

## 2014-09-18 ENCOUNTER — Emergency Department: Payer: Medicare Other

## 2014-09-18 DIAGNOSIS — Z7902 Long term (current) use of antithrombotics/antiplatelets: Secondary | ICD-10-CM | POA: Diagnosis not present

## 2014-09-18 DIAGNOSIS — K219 Gastro-esophageal reflux disease without esophagitis: Secondary | ICD-10-CM | POA: Diagnosis present

## 2014-09-18 DIAGNOSIS — I5032 Chronic diastolic (congestive) heart failure: Secondary | ICD-10-CM | POA: Diagnosis present

## 2014-09-18 DIAGNOSIS — J441 Chronic obstructive pulmonary disease with (acute) exacerbation: Secondary | ICD-10-CM | POA: Diagnosis present

## 2014-09-18 DIAGNOSIS — Z955 Presence of coronary angioplasty implant and graft: Secondary | ICD-10-CM | POA: Diagnosis not present

## 2014-09-18 DIAGNOSIS — J9621 Acute and chronic respiratory failure with hypoxia: Secondary | ICD-10-CM | POA: Diagnosis present

## 2014-09-18 DIAGNOSIS — I10 Essential (primary) hypertension: Secondary | ICD-10-CM | POA: Diagnosis present

## 2014-09-18 DIAGNOSIS — Z7982 Long term (current) use of aspirin: Secondary | ICD-10-CM

## 2014-09-18 DIAGNOSIS — Z888 Allergy status to other drugs, medicaments and biological substances status: Secondary | ICD-10-CM | POA: Diagnosis not present

## 2014-09-18 DIAGNOSIS — E785 Hyperlipidemia, unspecified: Secondary | ICD-10-CM | POA: Diagnosis present

## 2014-09-18 DIAGNOSIS — F419 Anxiety disorder, unspecified: Secondary | ICD-10-CM | POA: Diagnosis present

## 2014-09-18 DIAGNOSIS — Z79899 Other long term (current) drug therapy: Secondary | ICD-10-CM | POA: Diagnosis not present

## 2014-09-18 DIAGNOSIS — J449 Chronic obstructive pulmonary disease, unspecified: Secondary | ICD-10-CM | POA: Diagnosis present

## 2014-09-18 DIAGNOSIS — R0902 Hypoxemia: Secondary | ICD-10-CM

## 2014-09-18 DIAGNOSIS — I2510 Atherosclerotic heart disease of native coronary artery without angina pectoris: Secondary | ICD-10-CM | POA: Diagnosis present

## 2014-09-18 LAB — COMPREHENSIVE METABOLIC PANEL
ALBUMIN: 4.1 g/dL (ref 3.5–5.0)
ALK PHOS: 112 U/L (ref 38–126)
ALT: 15 U/L (ref 14–54)
AST: 17 U/L (ref 15–41)
Anion gap: 7 (ref 5–15)
BUN: 9 mg/dL (ref 6–20)
CALCIUM: 9.3 mg/dL (ref 8.9–10.3)
CHLORIDE: 104 mmol/L (ref 101–111)
CO2: 29 mmol/L (ref 22–32)
CREATININE: 0.68 mg/dL (ref 0.44–1.00)
GFR calc Af Amer: >60 mL/min (ref >60)
GFR calc non Af Amer: >60 mL/min (ref >60)
GLUCOSE: 109 mg/dL — AB (ref 65–99)
Potassium: 3.8 mmol/L (ref 3.5–5.1)
SODIUM: 140 mmol/L (ref 135–145)
Total Bilirubin: 0.8 mg/dL (ref 0.3–1.2)
Total Protein: 7.6 g/dL (ref 6.5–8.1)

## 2014-09-18 LAB — BRAIN NATRIURETIC PEPTIDE: B Natriuretic Peptide: 65 pg/mL (ref 0.0–100.0)

## 2014-09-18 LAB — CBC WITH DIFFERENTIAL/PLATELET
BASOS PCT: 0 %
Basophils Absolute: 0 10*3/uL (ref 0–0.1)
Eosinophils Absolute: 0.1 10*3/uL (ref 0–0.7)
Eosinophils Relative: 0 %
HEMATOCRIT: 44 % (ref 35.0–47.0)
HEMOGLOBIN: 14.2 g/dL (ref 12.0–16.0)
LYMPHS PCT: 22 %
Lymphs Abs: 4.5 10*3/uL — ABNORMAL HIGH (ref 1.0–3.6)
MCH: 31.1 pg (ref 26.0–34.0)
MCHC: 32.3 g/dL (ref 32.0–36.0)
MCV: 96.3 fL (ref 80.0–100.0)
MONO ABS: 1.5 10*3/uL — AB (ref 0.2–0.9)
MONOS PCT: 8 %
NEUTROS ABS: 14.2 10*3/uL — AB (ref 1.4–6.5)
NEUTROS PCT: 70 %
Platelets: 242 10*3/uL (ref 150–440)
RBC: 4.57 MIL/uL (ref 3.80–5.20)
RDW: 13.8 % (ref 11.5–14.5)
WBC: 20.3 10*3/uL — ABNORMAL HIGH (ref 3.6–11.0)

## 2014-09-18 LAB — TROPONIN I: Troponin I: 0.03 ng/mL (ref <0.031)

## 2014-09-18 LAB — LACTIC ACID, PLASMA
Lactic Acid, Venous: 0.8 mmol/L (ref 0.5–2.0)
Lactic Acid, Venous: 1.3 mmol/L (ref 0.5–2.0)

## 2014-09-18 MED ORDER — ALBUTEROL SULFATE (2.5 MG/3ML) 0.083% IN NEBU: 3.0000 mL | INHALATION_SOLUTION | RESPIRATORY_TRACT | Status: DC | PRN

## 2014-09-18 MED ORDER — ACETAMINOPHEN 325 MG PO TABS
650.0000 mg | ORAL_TABLET | Freq: Four times a day (QID) | ORAL | Status: DC | PRN
  Administered 2014-09-21: 10:00:00 650 mg via ORAL
  Filled 2014-09-18: qty 2

## 2014-09-18 MED ORDER — IPRATROPIUM-ALBUTEROL 0.5-2.5 (3) MG/3ML IN SOLN
3.0000 mL | Freq: Once | RESPIRATORY_TRACT | Status: AC
  Administered 2014-09-18: 3 mL via RESPIRATORY_TRACT

## 2014-09-18 MED ORDER — IPRATROPIUM-ALBUTEROL 0.5-2.5 (3) MG/3ML IN SOLN: 3.0000 mL | Freq: Four times a day (QID) | RESPIRATORY_TRACT | Status: DC

## 2014-09-18 MED ORDER — OXYCODONE HCL 5 MG PO TABS
10.0000 mg | ORAL_TABLET | Freq: Four times a day (QID) | ORAL | Status: DC | PRN
  Administered 2014-09-18 – 2014-09-20 (×6): 10 mg via ORAL
  Filled 2014-09-18 (×6): qty 2

## 2014-09-18 MED ORDER — ALUM & MAG HYDROXIDE-SIMETH 200-200-20 MG/5ML PO SUSP: 30.0000 mL | Freq: Four times a day (QID) | ORAL | Status: DC | PRN

## 2014-09-18 MED ORDER — NITROGLYCERIN 0.4 MG SL SUBL: 0.4000 mg | SUBLINGUAL_TABLET | SUBLINGUAL | Status: DC | PRN

## 2014-09-18 MED ORDER — LISINOPRIL 20 MG PO TABS
20.0000 mg | ORAL_TABLET | Freq: Every day | ORAL | Status: DC
  Administered 2014-09-18 – 2014-09-21 (×4): 20 mg via ORAL
  Filled 2014-09-18 (×4): qty 1

## 2014-09-18 MED ORDER — SODIUM CHLORIDE 0.9 % IJ SOLN
3.0000 mL | Freq: Two times a day (BID) | INTRAMUSCULAR | Status: DC
  Administered 2014-09-18 – 2014-09-21 (×8): 3 mL via INTRAVENOUS

## 2014-09-18 MED ORDER — SODIUM CHLORIDE 0.9 % IV BOLUS (SEPSIS)
1000.0000 mL | Freq: Once | INTRAVENOUS | Status: AC
  Administered 2014-09-18: 1000 mL via INTRAVENOUS
  Filled 2014-09-18: qty 1000

## 2014-09-18 MED ORDER — METHYLPREDNISOLONE SODIUM SUCC 125 MG IJ SOLR
60.0000 mg | Freq: Three times a day (TID) | INTRAMUSCULAR | Status: DC
  Administered 2014-09-18 – 2014-09-22 (×11): 60 mg via INTRAVENOUS
  Filled 2014-09-18 (×11): qty 2

## 2014-09-18 MED ORDER — LEVOFLOXACIN IN D5W 500 MG/100ML IV SOLN
500.0000 mg | INTRAVENOUS | Status: DC
  Administered 2014-09-19 – 2014-09-21 (×3): 500 mg via INTRAVENOUS
  Filled 2014-09-18 (×4): qty 100

## 2014-09-18 MED ORDER — ALPRAZOLAM 0.25 MG PO TABS: 0.2500 mg | ORAL_TABLET | Freq: Two times a day (BID) | ORAL | Status: DC | PRN

## 2014-09-18 MED ORDER — LEVOFLOXACIN IN D5W 750 MG/150ML IV SOLN
750.0000 mg | Freq: Once | INTRAVENOUS | Status: AC
  Administered 2014-09-18: 750 mg via INTRAVENOUS
  Filled 2014-09-18: qty 150

## 2014-09-18 MED ORDER — METHYLPREDNISOLONE SODIUM SUCC 125 MG IJ SOLR
125.0000 mg | Freq: Once | INTRAMUSCULAR | Status: AC
  Administered 2014-09-18: 125 mg via INTRAVENOUS
  Filled 2014-09-18: qty 2

## 2014-09-18 MED ORDER — PANTOPRAZOLE SODIUM 40 MG PO TBEC
40.0000 mg | DELAYED_RELEASE_TABLET | Freq: Every day | ORAL | Status: DC
  Administered 2014-09-18 – 2014-09-21 (×4): 40 mg via ORAL
  Filled 2014-09-18 (×4): qty 1

## 2014-09-18 MED ORDER — ASPIRIN EC 81 MG PO TBEC
81.0000 mg | DELAYED_RELEASE_TABLET | Freq: Every day | ORAL | Status: DC
  Administered 2014-09-18 – 2014-09-21 (×4): 81 mg via ORAL
  Filled 2014-09-18 (×4): qty 1

## 2014-09-18 MED ORDER — INFLUENZA VAC SPLIT QUAD 0.5 ML IM SUSY
0.5000 mL | PREFILLED_SYRINGE | INTRAMUSCULAR | Status: AC
  Administered 2014-09-19: 0.5 mL via INTRAMUSCULAR
  Filled 2014-09-18: qty 0.5

## 2014-09-18 MED ORDER — NICOTINE 14 MG/24HR TD PT24
14.0000 mg | MEDICATED_PATCH | Freq: Every day | TRANSDERMAL | Status: DC
  Administered 2014-09-18 – 2014-09-21 (×4): 14 mg via TRANSDERMAL
  Filled 2014-09-18 (×4): qty 1

## 2014-09-18 MED ORDER — ONDANSETRON HCL 4 MG/2ML IJ SOLN
4.0000 mg | Freq: Four times a day (QID) | INTRAMUSCULAR | Status: DC | PRN
  Administered 2014-09-19 – 2014-09-20 (×2): 4 mg via INTRAVENOUS
  Filled 2014-09-18 (×2): qty 2

## 2014-09-18 MED ORDER — IPRATROPIUM-ALBUTEROL 0.5-2.5 (3) MG/3ML IN SOLN
3.0000 mL | Freq: Once | RESPIRATORY_TRACT | Status: AC
  Administered 2014-09-18: 3 mL via RESPIRATORY_TRACT
  Filled 2014-09-18: qty 3

## 2014-09-18 MED ORDER — FUROSEMIDE 40 MG PO TABS
40.0000 mg | ORAL_TABLET | Freq: Every day | ORAL | Status: DC
  Administered 2014-09-18 – 2014-09-21 (×4): 40 mg via ORAL
  Filled 2014-09-18 (×4): qty 1

## 2014-09-18 MED ORDER — IPRATROPIUM-ALBUTEROL 0.5-2.5 (3) MG/3ML IN SOLN
RESPIRATORY_TRACT | Status: AC
  Administered 2014-09-18: 3 mL via RESPIRATORY_TRACT
  Filled 2014-09-18: qty 3

## 2014-09-18 MED ORDER — CLOPIDOGREL BISULFATE 75 MG PO TABS
75.0000 mg | ORAL_TABLET | Freq: Every day | ORAL | Status: DC
  Administered 2014-09-18 – 2014-09-21 (×4): 75 mg via ORAL
  Filled 2014-09-18 (×4): qty 1

## 2014-09-18 MED ORDER — SENNOSIDES-DOCUSATE SODIUM 8.6-50 MG PO TABS
1.0000 | ORAL_TABLET | Freq: Every evening | ORAL | Status: DC | PRN
  Administered 2014-09-20 – 2014-09-21 (×2): 1 via ORAL
  Filled 2014-09-18 (×2): qty 1

## 2014-09-18 MED ORDER — IPRATROPIUM-ALBUTEROL 0.5-2.5 (3) MG/3ML IN SOLN
3.0000 mL | RESPIRATORY_TRACT | Status: DC
  Administered 2014-09-18 – 2014-09-22 (×23): 3 mL via RESPIRATORY_TRACT
  Filled 2014-09-18 (×23): qty 3

## 2014-09-18 MED ORDER — ENOXAPARIN SODIUM 40 MG/0.4ML ~~LOC~~ SOLN
40.0000 mg | SUBCUTANEOUS | Status: DC
  Administered 2014-09-18: 40 mg via SUBCUTANEOUS
  Filled 2014-09-18: qty 0.4

## 2014-09-18 MED ORDER — SIMVASTATIN 20 MG PO TABS
20.0000 mg | ORAL_TABLET | Freq: Every day | ORAL | Status: DC
  Administered 2014-09-18 – 2014-09-21 (×4): 20 mg via ORAL
  Filled 2014-09-18 (×4): qty 1

## 2014-09-18 MED ORDER — ACETAMINOPHEN 650 MG RE SUPP: 650.0000 mg | Freq: Four times a day (QID) | RECTAL | Status: DC | PRN

## 2014-09-18 MED ORDER — LORAZEPAM 1 MG PO TABS
1.0000 mg | ORAL_TABLET | Freq: Three times a day (TID) | ORAL | Status: DC | PRN
  Administered 2014-09-18 – 2014-09-19 (×2): 1 mg via ORAL
  Filled 2014-09-18 (×2): qty 1

## 2014-09-18 MED ORDER — ONDANSETRON HCL 4 MG PO TABS
4.0000 mg | ORAL_TABLET | Freq: Four times a day (QID) | ORAL | Status: DC | PRN
  Administered 2014-09-19 – 2014-09-21 (×5): 4 mg via ORAL
  Filled 2014-09-18 (×5): qty 1

## 2014-09-18 MED ORDER — ALBUTEROL SULFATE HFA 108 (90 BASE) MCG/ACT IN AERS: 1.0000 | INHALATION_SPRAY | Freq: Four times a day (QID) | RESPIRATORY_TRACT | Status: DC | PRN

## 2014-09-18 MED ORDER — HYDROCODONE-ACETAMINOPHEN 5-325 MG PO TABS
1.0000 | ORAL_TABLET | ORAL | Status: DC | PRN
  Administered 2014-09-18 – 2014-09-19 (×2): 2 via ORAL
  Filled 2014-09-18 (×3): qty 2

## 2014-09-18 NOTE — H&P (Signed)
[facility] [place] NAME: Dawn Donaldson    MR#:  [id number]  DATE OF BIRTH:  Dec 29, 1950  DATE OF ADMISSION:  09/18/2014  PRIMARY CARE PHYSICIAN: Lorelee Market, MD   REQUESTING/REFERRING PHYSICIAN: Dr Reita Cliche  CHIEF COMPLAINT:  Shortness of breath HISTORY OF PRESENT ILLNESS:  Dawn Donaldson  is a 64 y.o. female with a known history of chronic respiratory failure from COPD on 2 L of oxygen who presents with above complaint. Over the past 2-3 days patient had increasing shortness of breath, dyspnea exertion, wheezing and nonproductive cough with chills. She is on of COPD exacerbation in the emergency room. She was started on BiPAP and now back on her oxygen. She continues to have bilateral wheezing. She was also given 1 dose of IV Levaquin for bronchitis.  PAST MEDICAL HISTORY:   Past Medical History  Diagnosis Date  . COPD (chronic obstructive pulmonary disease)   . Hypertension   . CAD (coronary artery disease)   . Diastolic CHF   . Hyperlipidemia     PAST SURGICAL HISTORY:  None  SOCIAL HISTORY:   Social History  Substance Use Topics  . Smoking status: Never Smoker   . Smokeless tobacco: Not on file  . Alcohol Use: No    FAMILY HISTORY:  CAD  DRUG ALLERGIES:   Allergies  Allergen Reactions  . Dexlansoprazole Nausea And Vomiting  . Ultram [Tramadol] Itching     REVIEW OF SYSTEMS:  CONSTITUTIONAL: No fever, fatigue or weakness.  EYES: No blurred or double vision.  EARS, NOSE, AND THROAT: No tinnitus or ear pain.  RESPIRATORY: +++cough, shortness of breath, wheezing no hemoptysis.  CARDIOVASCULAR: No chest pain, orthopnea, edema.  GASTROINTESTINAL: No nausea, vomiting, diarrhea or abdominal pain.  GENITOURINARY: No dysuria, hematuria.  ENDOCRINE: No polyuria, nocturia,  HEMATOLOGY: No anemia, easy bruising or bleeding SKIN: No rash or lesion. MUSCULOSKELETAL: No joint pain or arthritis.   NEUROLOGIC: No tingling,  numbness, weakness.  PSYCHIATRY: No anxiety or depression.   MEDICATIONS AT HOME:   Prior to Admission medications   Medication Sig Start Date End Date Taking? Authorizing Provider  albuterol (PROVENTIL HFA;VENTOLIN HFA) 108 (90 BASE) MCG/ACT inhaler Inhale 1 puff into the lungs every 6 (six) hours as needed for wheezing or shortness of breath.   Yes Historical Provider, MD  albuterol (PROVENTIL) (2.5 MG/3ML) 0.083% nebulizer solution Inhale 3 mLs into the lungs every 4 (four) hours as needed for wheezing or shortness of breath. 05/27/14  Yes Gladstone Lighter, MD  aspirin EC 81 MG tablet Take 81 mg by mouth daily.    Yes Historical Provider, MD  clopidogrel (PLAVIX) 75 MG tablet Take 75 mg by mouth daily.  05/14/14  Yes Historical Provider, MD  COMBIVENT RESPIMAT 20-100 MCG/ACT AERS respimat Inhale 1 puff into the lungs 4 (four) times daily. 05/27/14  Yes Gladstone Lighter, MD  furosemide (LASIX) 40 MG tablet Take 40 mg by mouth daily.  05/14/14  Yes Historical Provider, MD  lisinopril (PRINIVIL,ZESTRIL) 20 MG tablet Take 20 mg by mouth daily.  05/14/14  Yes Historical Provider, MD  LORazepam (ATIVAN) 1 MG tablet Take 1 tablet (1 mg total) by mouth every 8 (eight) hours as needed for anxiety. 05/27/14  Yes Gladstone Lighter, MD  NEXIUM 40 MG capsule Take 40 mg by mouth 2 (two) times daily.  05/14/14  Yes Historical Provider, MD  nitroGLYCERIN (NITROSTAT) 0.4 MG SL tablet Place 0.4 mg under the tongue every 5 (five) minutes  as needed.  05/24/12  Yes Historical Provider, MD  Oxycodone HCl 10 MG TABS Take 1 tablet (10 mg total) by mouth every 6 (six) hours as needed. 05/27/14  Yes Gladstone Lighter, MD  simvastatin (ZOCOR) 20 MG tablet Take 20 mg by mouth daily.  05/14/14  Yes Historical Provider, MD      VITAL SIGNS:  Blood pressure 145/75, pulse 94, temperature 99.6 F (37.6 C), temperature source Oral, resp. rate 27, height 5\' 2"  (1.575 m), weight 90.719 kg (200 lb), SpO2 98 %.  PHYSICAL  EXAMINATION:  GENERAL:  64 y.o.-year-old patient lying in the bed with no acute distress.  EYES: Pupils equal, round, reactive to light and accommodation. No scleral icterus. Extraocular muscles intact.  HEENT: Head atraumatic, normocephalic. Oropharynx and nasopharynx clear.  NECK:  Supple, no jugular venous distention. No thyroid enlargement, no tenderness.  LUNGS: Patient has diffuse wheezing with fair air movement and prolonged expiration no use of assistive muscles CARDIOVASCULAR: S1, S2 normal. No murmurs, rubs, or gallops.  ABDOMEN: Soft, nontender, nondistended. Bowel sounds present. No organomegaly or mass.  EXTREMITIES: No pedal edema, cyanosis, or clubbing.  NEUROLOGIC: Cranial nerves II through XII are grossly intact. No focal deficits. PSYCHIATRIC: The patient is alert and oriented x 3.  SKIN: No obvious rash, lesion, or ulcer.   LABORATORY PANEL:   CBC  Recent Labs Lab 09/18/14 1000  WBC 20.3*  HGB 14.2  HCT 44.0  PLT 242   ------------------------------------------------------------------------------------------------------------------  Chemistries   Recent Labs Lab 09/18/14 1000  NA 140  K 3.8  CL 104  CO2 29  GLUCOSE 109*  BUN 9  CREATININE 0.68  CALCIUM 9.3  AST 17  ALT 15  ALKPHOS 112  BILITOT 0.8   ------------------------------------------------------------------------------------------------------------------  Cardiac Enzymes  Recent Labs Lab 09/18/14 1000  TROPONINI 0.03   ------------------------------------------------------------------------------------------------------------------  RADIOLOGY:  Dg Chest Port 1 View  09/18/2014   CLINICAL DATA:  Shortness of  EXAM: PORTABLE CHEST - 1 VIEW  COMPARISON:  08/25/2014  FINDINGS: There is minimal atelectasis in the left lower lobe. Lungs elsewhere clear. Heart size and pulmonary vascularity are normal. No adenopathy. No bone lesions.  IMPRESSION: Minimal left lower lobe atelectasis.  Lungs  elsewhere clear.   Electronically Signed   By: Lowella Grip III M.D.   On: 09/18/2014 10:09    EKG:  Sinus tachycardia no ST elevation depression  IMPRESSION AND PLAN:   This very pleasant 64 year old female with chronic respiratory failure secondary to COPD who wears 2 L oxygen presents with acute on chronic hypoxic respiratory failure from COPD exacerbation.  1. Acute on chronic respiratory failure, hypoxia: This is secondary to COPD exacerbation. Plan as outlined below.  2. Acute COPD exacerbation: Patient is now off BiPAP and back on oxygen 2 L. We will continue with IV steroids, Levaquin, DuoNeb's and inhalers.  3. History of CAD with coronary artery stent: Patient is on Plavix, aspirin and lisinopril which I will continue. She'll also continue simvastatin.  4. GERD: Continue PPI.  5. Anxiety: Continue Ativan when necessary.  6. Essential hypertension: Continue lisinopril.   All the records are reviewed and case discussed with ED provider. Management plans discussed with the patient and she is in agreement.  CODE STATUS: FULL  TOTAL TIME TAKING CARE OF THIS PATIENT: 45 minutes.    Shunsuke Granzow M.D on 09/18/2014 at 1:44 PM  Between 7am to 6pm - Pager - [phone] After 6pm go to [email or web address] - password [email or web address] [facility]  Office  [phone]  CC: Primary care physician; Lorelee Market, MD

## 2014-09-18 NOTE — ED Provider Notes (Signed)
[facility] Emergency Department Provider Note   ____________________________________________  Time seen: 9:40 AM I have reviewed the triage vital signs and the triage nursing note.  HISTORY  Chief Complaint Shortness of Breath and Chest Pain   Historian Dawn Donaldson, and her granddaughter as well  HPI Ceriah Donaldson is a 64 y.o. female who is in moderate respiratory distress and is here for trouble breathing.Dawn Donaldson's history of COPD and CHF, who is reporting increased trouble breathing for 2-3 days. States yesterday she was resting all day and had actually bump up her oxygen from home 2 L to 3 L all day. Symptoms got worse and so she came in this morning for evaluation. Last auscultation was 4-5 months ago per the Dawn Donaldson. Last time she was on prednisone was several months ago. She has had use BiPAP in the past. Is been no chest pain. She has had some chills as well as a cough with some mild productive sputum. She is denying axis weight gain or lower extremity edema.    Past Medical History  Diagnosis Date  . COPD (chronic obstructive pulmonary disease)   . Hypertension   . CAD (coronary artery disease)   . Diastolic CHF   . Hyperlipidemia     Dawn Donaldson Active Problem List   Diagnosis Date Noted  . COPD (chronic obstructive pulmonary disease) 05/23/2014    History reviewed. No pertinent past surgical history.  Current Outpatient Rx  Name  Route  Sig  Dispense  Refill  . albuterol (PROVENTIL HFA;VENTOLIN HFA) 108 (90 BASE) MCG/ACT inhaler   Inhalation   Inhale 1 puff into the lungs every 6 (six) hours as needed for wheezing or shortness of breath.         Marland Kitchen albuterol (PROVENTIL) (2.5 MG/3ML) 0.083% nebulizer solution   Inhalation   Inhale 3 mLs into the lungs every 4 (four) hours as needed for wheezing or shortness of breath.   75 mL   1   . aspirin EC 81 MG tablet   Oral   Take 81 mg by mouth daily.          . clopidogrel (PLAVIX) 75 MG  tablet   Oral   Take 75 mg by mouth daily.          . COMBIVENT RESPIMAT 20-100 MCG/ACT AERS respimat   Inhalation   Inhale 1 puff into the lungs 4 (four) times daily.   1 Inhaler   2     Dispense as written.   . furosemide (LASIX) 40 MG tablet   Oral   Take 40 mg by mouth daily.          Marland Kitchen lisinopril (PRINIVIL,ZESTRIL) 20 MG tablet   Oral   Take 20 mg by mouth daily.          Marland Kitchen LORazepam (ATIVAN) 1 MG tablet   Oral   Take 1 tablet (1 mg total) by mouth every 8 (eight) hours as needed for anxiety.   20 tablet   0   . NEXIUM 40 MG capsule   Oral   Take 40 mg by mouth 2 (two) times daily.            Dispense as written.   . nitroGLYCERIN (NITROSTAT) 0.4 MG SL tablet   Sublingual   Place 0.4 mg under the tongue every 5 (five) minutes as needed.          . Oxycodone HCl 10 MG TABS   Oral   Take 1 tablet (  10 mg total) by mouth every 6 (six) hours as needed.   20 tablet   0   . simvastatin (ZOCOR) 20 MG tablet   Oral   Take 20 mg by mouth daily.            Allergies Dexlansoprazole and Ultram  No family history on file.  Social History Social History  Substance Use Topics  . Smoking status: Never Smoker   . Smokeless tobacco: None  . Alcohol Use: No    Review of Systems  Constitutional: Negative for fever. Eyes: Negative for visual changes. ENT: Negative for sore throat. Cardiovascular: Negative for chest pain. Respiratory: Positive for shortness of breath and cough. Gastrointestinal: Negative for abdominal pain, vomiting and diarrhea. Genitourinary: Negative for dysuria. Musculoskeletal: Negative for back pain. Skin: Negative for rash. Neurological: Negative for headache. 10 point Review of Systems otherwise negative ____________________________________________   PHYSICAL EXAM:  VITAL SIGNS: ED Triage Vitals  Enc Vitals Group     BP 09/18/14 0925 172/87 mmHg     Pulse Rate 09/18/14 0925 105     Resp 09/18/14 0925 28     Temp  09/18/14 0925 99.6 F (37.6 C)     Temp Source 09/18/14 0925 Oral     SpO2 09/18/14 0925 97 %     Weight 09/18/14 0925 200 lb (90.719 kg)     Height 09/18/14 0925 5\' 2"  (1.575 m)     Head Cir --      Peak Flow --      Pain Score 09/18/14 0926 9     Pain Loc --      Pain Edu? --      Excl. in [place]? --      Constitutional: Alert and oriented. Well appearing and in mild respiratory distress. Eyes: Conjunctivae are normal. PERRL. Normal extraocular movements. ENT   Head: Normocephalic and atraumatic.   Nose: No congestion/rhinnorhea.   Mouth/Throat: Mucous membranes are moist.   Neck: No stridor. Cardiovascular/Chest: Tachycardic, regular..  No murmurs, rubs, or gallops. Respiratory: Normal respiratory effort without tachypnea nor retractions. Breath sounds are clear and equal bilaterally. No wheezes/rales/rhonchi. Gastrointestinal: Soft. No distention, no guarding, no rebound. Nontender   Genitourinary/rectal:Deferred Musculoskeletal: Nontender with normal range of motion in all extremities. No joint effusions.  No lower extremity tenderness.  No edema. Neurologic:  Normal speech and language. No gross or focal neurologic deficits are appreciated. Skin:  Skin is warm, dry and intact. No rash noted. Psychiatric: Mood and affect are normal. Speech and behavior are normal. Dawn Donaldson exhibits appropriate insight and judgment.  ____________________________________________   EKG I, Lisa Roca, MD, the attending physician have personally viewed and interpreted all ECGs.  118 bpm. Sinus tachycardia. Narrow QRS. Normal axis. Normal ST and T-wave. ____________________________________________  LABS (pertinent positives/negatives)  Conference of metabolic panel without significant abnormality Troponin 0.03 Lactic acid 0.8 White blood count 20.3, hemoglobin 14.2 and platelet count 242 BNP 65  ____________________________________________  RADIOLOGY All Xrays were viewed by  me. Imaging interpreted by Radiologist.  Chest x-ray portable:   IMPRESSION: Minimal left lower lobe atelectasis. Lungs elsewhere clear. __________________________________________  PROCEDURES  Procedure(s) performed: None  Critical Care performed: CRITICAL CARE Performed by: Lisa Roca   Total critical care time: 30 minutes  Critical care time was exclusive of separately billable procedures and treating other patients.  Critical care was necessary to treat or prevent imminent or life-threatening deterioration.  Critical care was time spent personally by me on the following activities: development of  treatment plan with Dawn Donaldson and/or surrogate as well as nursing, discussions with consultants, evaluation of Dawn Donaldson's response to treatment, examination of Dawn Donaldson, obtaining history from Dawn Donaldson or surrogate, ordering and performing treatments and interventions, ordering and review of laboratory studies, ordering and review of radiographic studies, pulse oximetry and re-evaluation of Dawn Donaldson's condition.   ____________________________________________   ED COURSE / ASSESSMENT AND PLAN  CONSULTATIONS: Face-to-face with hospitalist for admission  Pertinent labs & imaging results that were available during my care of the Dawn Donaldson were reviewed by me and considered in my medical decision making (see chart for details).  Dawn Donaldson arrived in moderate respiratory distress with hypoxia in the mid 80s on her home 2 L nasal cannula. Dawn Donaldson was bumped up to 3 L nasal cannula and O2 sat was 95%. Dawn Donaldson has extreme audible wheezing with some mild decreased air movement throughout all fields. She is also having intermittent cough. Symptoms concerning for possible pneumonia as well as COPD exacerbation. CHF seems much less likely clinically.  Dawn Donaldson's chest x-ray is without focal abnormalities, , however her clinical findings are somewhat concerning for pulmonary infectious etiology with  cough and elevated white blood count of 20.3. Dawn Donaldson will be started on dose of Levaquin.  After 3 DuoNeb's, Dawn Donaldson was still not much better, and I chose to initiate BiPAP.   Dawn Donaldson / Family / Caregiver informed of clinical course, medical decision-making process, and agree with plan.    ___________________________________________   FINAL CLINICAL IMPRESSION(S) / ED DIAGNOSES   Final diagnoses:  COPD with exacerbation  Hypoxia       Lisa Roca, MD 09/18/14 1315

## 2014-09-18 NOTE — Progress Notes (Signed)
Pt admitted today from home with copd excer. 02 2l [place] at present/ sats 96-97%.  Lives at home with longtime boyfriend.   Has a  Hx of chronic back pain.  bil wheezes. svns in use.

## 2014-09-18 NOTE — Plan of Care (Signed)
Problem: Phase I Progression Outcomes Goal: O2 sats > or equal 90% or at baseline Outcome: Progressing Pt on 2l [place] 02 at present time.  Pt required  Non rebreather and  bipap in ed for a while but progressed  To [place].  sats 96-97%.   Goal: Dyspnea controlled at rest Outcome: Not Progressing Cont to have  Dyspnea at rest which increasewith exertion. o2 cont.  svn treatments.  Solumedrol and  Iv abx in place. Goal: Pain controlled Outcome: Progressing Hx of chronic back pain. norco given and med  For anxiousness. Goal: Discharge plan established Outcome: Not Progressing Frequent adnissions

## 2014-09-18 NOTE — ED Notes (Signed)
Pt c/o SOB with chest pain since last night, COPD hx on 2 L [place] continous, wheezing with noted respiratory distress in triage.

## 2014-09-19 DIAGNOSIS — J441 Chronic obstructive pulmonary disease with (acute) exacerbation: Secondary | ICD-10-CM | POA: Diagnosis not present

## 2014-09-19 LAB — CBC
HCT: 39 % (ref 35.0–47.0)
HEMOGLOBIN: 12.7 g/dL (ref 12.0–16.0)
MCH: 31.4 pg (ref 26.0–34.0)
MCHC: 32.5 g/dL (ref 32.0–36.0)
MCV: 96.7 fL (ref 80.0–100.0)
Platelets: 232 10*3/uL (ref 150–440)
RBC: 4.03 MIL/uL (ref 3.80–5.20)
RDW: 13.4 % (ref 11.5–14.5)
WBC: 21.1 10*3/uL — ABNORMAL HIGH (ref 3.6–11.0)

## 2014-09-19 LAB — URINALYSIS COMPLETE WITH MICROSCOPIC (ARMC ONLY)
Bilirubin Urine: NEGATIVE
GLUCOSE, UA: NEGATIVE mg/dL
KETONES UR: NEGATIVE mg/dL
LEUKOCYTES UA: NEGATIVE
Nitrite: NEGATIVE
Protein, ur: NEGATIVE mg/dL
SPECIFIC GRAVITY, URINE: 1.012 (ref 1.005–1.030)
pH: 5 (ref 5.0–8.0)

## 2014-09-19 MED ORDER — ENOXAPARIN SODIUM 40 MG/0.4ML ~~LOC~~ SOLN
40.0000 mg | SUBCUTANEOUS | Status: DC
  Administered 2014-09-19 – 2014-09-21 (×3): 40 mg via SUBCUTANEOUS
  Filled 2014-09-19 (×3): qty 0.4

## 2014-09-19 NOTE — Plan of Care (Signed)
Problem: Discharge Progression Outcomes Goal: Other Discharge Outcomes/Goals Outcome: Progressing Plan of care progress to goal: Pain management issues. Pt said that she is followed by the pain clinic. Norco and Oxycodone given. Oxycodone more effective but frequency may need to be changed to q 4 hrs instead of q 6.  Pt up ad lib independently. Urine sent for UA. Pt eating and drinking well without difficulty.

## 2014-09-19 NOTE — Progress Notes (Signed)
Patient ID: Dawn Donaldson, female   DOB: Sep 12, 1950, 64 y.o.   MRN: [id number] [facility] PROGRESS NOTE  PCP: Lorelee Market, MD  HPI/Subjective: Patient breathing a little bit better than yesterday. Still with audible wheeze heard without stethoscope. She starting to cough up some greenish phlegm. At home had some fever and chills.  Objective: Filed Vitals:   09/19/14 0501  BP: 124/61  Pulse: 78  Temp: 97.9 F (36.6 C)  Resp: 20    Filed Weights   09/18/14 0925 09/18/14 1512  Weight: 90.719 kg (200 lb) 91.763 kg (202 lb 4.8 oz)    ROS: Review of Systems  Constitutional: Negative for fever and chills.  Eyes: Negative for blurred vision.  Respiratory: Positive for cough, sputum production and shortness of breath.   Cardiovascular: Negative for chest pain.  Gastrointestinal: Negative for nausea, vomiting, abdominal pain, diarrhea and constipation.  Genitourinary: Negative for dysuria.  Musculoskeletal: Negative for joint pain.  Neurological: Negative for dizziness and headaches.   Exam: Physical Exam  Constitutional: She is oriented to person, place, and time.  HENT:  Nose: No mucosal edema.  Mouth/Throat: No oropharyngeal exudate or posterior oropharyngeal edema.  Eyes: Conjunctivae, EOM and lids are normal. Pupils are equal, round, and reactive to light.  Neck: No JVD present. Carotid bruit is not present. No edema present. No thyroid mass and no thyromegaly present.  Cardiovascular: S1 normal and S2 normal.  Exam reveals no gallop.   No murmur heard. Pulses:      Dorsalis pedis pulses are 2+ on the right side, and 2+ on the left side.  Respiratory: No respiratory distress. She has wheezes in the right upper field, the right middle field, the right lower field, the left upper field, the left middle field and the left lower field. She has no rhonchi. She has no rales.  The wheeze seems transmitted from the upper airway.  GI: Soft. Bowel sounds are  normal. There is no tenderness.  Musculoskeletal:       Right ankle: She exhibits no swelling.       Left ankle: She exhibits no swelling.  Lymphadenopathy:    She has no cervical adenopathy.  Neurological: She is alert and oriented to person, place, and time. No cranial nerve deficit.  Skin: Skin is warm. No rash noted. Nails show no clubbing.  Psychiatric: She has a normal mood and affect.    Data Reviewed: Basic Metabolic Panel:  Recent Labs Lab 09/18/14 1000  NA 140  K 3.8  CL 104  CO2 29  GLUCOSE 109*  BUN 9  CREATININE 0.68  CALCIUM 9.3   Liver Function Tests:  Recent Labs Lab 09/18/14 1000  AST 17  ALT 15  ALKPHOS 112  BILITOT 0.8  PROT 7.6  ALBUMIN 4.1   CBC:  Recent Labs Lab 09/18/14 1000 09/19/14 0611  WBC 20.3* 21.1*  NEUTROABS 14.2*  --   HGB 14.2 12.7  HCT 44.0 39.0  MCV 96.3 96.7  PLT 242 232    Studies: Dg Chest Port 1 View  09/18/2014   CLINICAL DATA:  Shortness of  EXAM: PORTABLE CHEST - 1 VIEW  COMPARISON:  08/25/2014  FINDINGS: There is minimal atelectasis in the left lower lobe. Lungs elsewhere clear. Heart size and pulmonary vascularity are normal. No adenopathy. No bone lesions.  IMPRESSION: Minimal left lower lobe atelectasis.  Lungs elsewhere clear.   Electronically Signed   By: Lowella Grip III M.D.   On: 09/18/2014 10:09  Scheduled Meds: . aspirin EC  81 mg Oral Daily  . clopidogrel  75 mg Oral Daily  . enoxaparin (LOVENOX) injection  40 mg Subcutaneous Q24H  . furosemide  40 mg Oral Daily  . Influenza vac split quadrivalent PF  0.5 mL Intramuscular Tomorrow-1000  . ipratropium-albuterol  3 mL Nebulization Q4H  . levofloxacin (LEVAQUIN) IV  500 mg Intravenous Q24H  . lisinopril  20 mg Oral Daily  . methylPREDNISolone (SOLU-MEDROL) injection  60 mg Intravenous 3 times per day  . nicotine  14 mg Transdermal Daily  . pantoprazole  40 mg Oral Daily  . simvastatin  20 mg Oral Daily  . sodium chloride  3 mL Intravenous  Q12H    Assessment/Plan:  1. Acute on chronic respiratory failure with hypoxia. Patient initially required BiPAP. Pulse ox on room air at rest was 90%. Patient usually wears oxygen at night and sometimes during the day when she's not feeling well. 2. COPD exacerbation- continue Solu-Medrol 60 mg IV every 6 hours and Levaquin. Patient is on nebulizer treatments. Wheeze seems more transmitted from upper airway. 3. History of CAD- continue aspirin and Plavix and simvastatin 4. History of diastolic congestive heart failure- no signs of congestive heart failure at this time 5. Gastroesophageal reflux disease without esophagitis continue PPI 6. Hyperlipidemia unspecified continue simvastatin.  Code Status:     Code Status Orders        Start     Ordered   09/18/14 1529  Full code   Continuous     09/18/14 1528      Disposition Plan: Home once breathing better.  Antibiotics:  Levaquin  Time spent: 23 minutes  Loletha Grayer  [facility]

## 2014-09-20 MED ORDER — OXYCODONE HCL 5 MG PO TABS
10.0000 mg | ORAL_TABLET | Freq: Four times a day (QID) | ORAL | Status: DC | PRN
  Administered 2014-09-20 – 2014-09-22 (×5): 10 mg via ORAL
  Filled 2014-09-20 (×5): qty 2

## 2014-09-20 MED ORDER — BUDESONIDE 0.25 MG/2ML IN SUSP
0.2500 mg | Freq: Two times a day (BID) | RESPIRATORY_TRACT | Status: DC
  Administered 2014-09-20 – 2014-09-22 (×5): 0.25 mg via RESPIRATORY_TRACT
  Filled 2014-09-20 (×5): qty 2

## 2014-09-20 NOTE — Progress Notes (Signed)
Patient ID: Dawn Donaldson, female   DOB: 09-07-1950, 64 y.o.   MRN: [id number] [facility] PROGRESS NOTE  PCP: Lorelee Market, MD  HPI/Subjective: Patient still with audible wheeze. Still with shortness of breath but better than when she came into the hospital.  Objective: Filed Vitals:   09/20/14 1240  BP: 140/77  Pulse: 84  Temp: 98 F (36.7 C)  Resp:     Filed Weights   09/18/14 0925 09/18/14 1512  Weight: 90.719 kg (200 lb) 91.763 kg (202 lb 4.8 oz)    ROS: Review of Systems  Constitutional: Negative for fever and chills.  Eyes: Negative for blurred vision.  Respiratory: Positive for cough, sputum production and shortness of breath.   Cardiovascular: Negative for chest pain.  Gastrointestinal: Negative for nausea, vomiting, abdominal pain, diarrhea and constipation.  Genitourinary: Negative for dysuria.  Musculoskeletal: Negative for joint pain.  Neurological: Negative for dizziness and headaches.   Exam: Physical Exam  Constitutional: She is oriented to person, place, and time.  HENT:  Nose: No mucosal edema.  Mouth/Throat: No oropharyngeal exudate or posterior oropharyngeal edema.  Eyes: Conjunctivae, EOM and lids are normal. Pupils are equal, round, and reactive to light.  Neck: No JVD present. Carotid bruit is not present. No edema present. No thyroid mass and no thyromegaly present.  Cardiovascular: S1 normal and S2 normal.  Exam reveals no gallop.   No murmur heard. Pulses:      Dorsalis pedis pulses are 2+ on the right side, and 2+ on the left side.  Respiratory: Accessory muscle usage present. Tachypnea noted. No respiratory distress. She has wheezes in the right upper field, the right middle field, the right lower field, the left upper field, the left middle field and the left lower field. She has no rhonchi. She has no rales.  The wheeze seems transmitted from the upper airway.  GI: Soft. Bowel sounds are normal. There is no tenderness.   Musculoskeletal:       Right ankle: She exhibits no swelling.       Left ankle: She exhibits no swelling.  Lymphadenopathy:    She has no cervical adenopathy.  Neurological: She is alert and oriented to person, place, and time. No cranial nerve deficit.  Skin: Skin is warm. No rash noted. Nails show no clubbing.  Psychiatric: She has a normal mood and affect.    Data Reviewed: Basic Metabolic Panel:  Recent Labs Lab 09/18/14 1000  NA 140  K 3.8  CL 104  CO2 29  GLUCOSE 109*  BUN 9  CREATININE 0.68  CALCIUM 9.3   Liver Function Tests:  Recent Labs Lab 09/18/14 1000  AST 17  ALT 15  ALKPHOS 112  BILITOT 0.8  PROT 7.6  ALBUMIN 4.1   CBC:  Recent Labs Lab 09/18/14 1000 09/19/14 0611  WBC 20.3* 21.1*  NEUTROABS 14.2*  --   HGB 14.2 12.7  HCT 44.0 39.0  MCV 96.3 96.7  PLT 242 232     Scheduled Meds: . aspirin EC  81 mg Oral Daily  . budesonide (PULMICORT) nebulizer solution  0.25 mg Nebulization BID  . clopidogrel  75 mg Oral Daily  . enoxaparin (LOVENOX) injection  40 mg Subcutaneous Q24H  . furosemide  40 mg Oral Daily  . ipratropium-albuterol  3 mL Nebulization Q4H  . levofloxacin (LEVAQUIN) IV  500 mg Intravenous Q24H  . lisinopril  20 mg Oral Daily  . methylPREDNISolone (SOLU-MEDROL) injection  60 mg Intravenous 3 times per  day  . nicotine  14 mg Transdermal Daily  . pantoprazole  40 mg Oral Daily  . simvastatin  20 mg Oral Daily  . sodium chloride  3 mL Intravenous Q12H    Assessment/Plan:  1. Acute on chronic respiratory failure with hypoxia. Patient initially required BiPAP. Patient currently on oxygen. Patient usually wears oxygen at night and sometimes during the day when she's not feeling well. 2. COPD exacerbation- continue Solu-Medrol 60 mg IV every 6 hours and Levaquin. Patient is on nebulizer treatments. Wheeze seems more transmitted from upper airway. Added budesonide nebulizers 3. History of CAD- continue aspirin and Plavix and  simvastatin 4. History of diastolic congestive heart failure- no signs of congestive heart failure at this time 5. Gastroesophageal reflux disease without esophagitis continue PPI 6. Hyperlipidemia unspecified continue simvastatin.  Code Status:     Code Status Orders        Start     Ordered   09/18/14 1529  Full code   Continuous     09/18/14 1528     Disposition Plan: Home once breathing better.  Antibiotics:  Levaquin  Time spent: 20 minutes  Loletha Grayer  [facility]

## 2014-09-20 NOTE — Care Management (Signed)
Admitted to this facility with the diagnosis of COPD. Lives with friend, Ashok Pall, [phone]). Home health about 5 years ago, doesn't remember name of agency. No skilled facility. Last seen Malachy Mood at Dr. Hetty Blend office 2-3 months ago. Pain Clinic last month. Chronic home oxygen thru [facility] x 7 years. No Life Alert. Uses a cane to aid in ambulation as needed. Friend helps with some of her basic needs as needed. No falls. O.K. Appetite. States she can drive, if needed. Shelbie Ammons RN MSN Care Management [phone]

## 2014-09-20 NOTE — Plan of Care (Signed)
Problem: Discharge Progression Outcomes Goal: Other Discharge Outcomes/Goals Outcome: Progressing Plan of care progress to goal: Pt with chronic back pain. Medicated with Oxycodone with improvement. Also, pt c/o nausea at times. Given Zofran this am. Results pending. Up ad lib. O2 at [employer]. Expiratory wheezes with dyspnea that worsens with activity.

## 2014-09-20 NOTE — Care Management Important Message (Signed)
Important Message  Patient Details  Name: Dawn Donaldson MRN: [id number] Date of Birth: 1950-09-15   Medicare Important Message Given:  Yes-second notification given    [name] 09/20/2014, 10:28 AM

## 2014-09-21 NOTE — Plan of Care (Signed)
Problem: Discharge Progression Outcomes Goal: Other Discharge Outcomes/Goals Outcome: Progressing Plan of care progress to goal: Pt with c/o back pain and nausea. Oxycodone given once with improvement. Zofran po given as well for nausea. Up ad lib independently. Still with expiratory wheezes. Pt had to be reminded to keep oxygen on due to the threat of desaturation.

## 2014-09-21 NOTE — Plan of Care (Signed)
Problem: Phase I Progression Outcomes Goal: Discharge plan established Outcome: Progressing VSS. Continue 2L O2 per [place] as at home. O2 sats in the high 90's with exertion, but still wheezing. Pain meds given for headache/chronic back pain with improvement. No c/o n/v.

## 2014-09-21 NOTE — Progress Notes (Signed)
Patient ID: Dawn Donaldson, female   DOB: 05-23-1950, 64 y.o.   MRN: [id number] [facility] PROGRESS NOTE  PCP: Lorelee Market, MD  HPI/Subjective: Patient feeling better today she is moving better air. Still with wheeze. Hoping to go home soon. She stated she coughed up a lot of green phlegm.  Objective: Filed Vitals:   09/21/14 0952  BP: 137/71  Pulse: 77  Temp: 98.1 F (36.7 C)  Resp: 20    Filed Weights   09/18/14 0925 09/18/14 1512  Weight: 90.719 kg (200 lb) 91.763 kg (202 lb 4.8 oz)    ROS: Review of Systems  Constitutional: Negative for fever and chills.  Eyes: Negative for blurred vision.  Respiratory: Positive for cough, sputum production and shortness of breath.   Cardiovascular: Negative for chest pain.  Gastrointestinal: Positive for constipation. Negative for nausea, vomiting, abdominal pain and diarrhea.  Genitourinary: Negative for dysuria.  Musculoskeletal: Negative for joint pain.  Neurological: Negative for dizziness and headaches.   Exam: Physical Exam  Constitutional: She is oriented to person, place, and time.  HENT:  Nose: No mucosal edema.  Mouth/Throat: No oropharyngeal exudate or posterior oropharyngeal edema.  Eyes: Conjunctivae, EOM and lids are normal. Pupils are equal, round, and reactive to light.  Neck: No JVD present. Carotid bruit is not present. No edema present. No thyroid mass and no thyromegaly present.  Cardiovascular: S1 normal and S2 normal.  Exam reveals no gallop.   No murmur heard. Pulses:      Dorsalis pedis pulses are 2+ on the right side, and 2+ on the left side.  Respiratory: No accessory muscle usage. No tachypnea. No respiratory distress. She has wheezes in the right middle field, the right lower field, the left middle field and the left lower field. She has no rhonchi. She has no rales.  The wheeze seems transmitted from the upper airway.  GI: Soft. Bowel sounds are normal. There is no tenderness.   Musculoskeletal:       Right ankle: She exhibits no swelling.       Left ankle: She exhibits no swelling.  Lymphadenopathy:    She has no cervical adenopathy.  Neurological: She is alert and oriented to person, place, and time. No cranial nerve deficit.  Skin: Skin is warm. No rash noted. Nails show no clubbing.  Psychiatric: She has a normal mood and affect.    Data Reviewed: Basic Metabolic Panel:  Recent Labs Lab 09/18/14 1000  NA 140  K 3.8  CL 104  CO2 29  GLUCOSE 109*  BUN 9  CREATININE 0.68  CALCIUM 9.3   Liver Function Tests:  Recent Labs Lab 09/18/14 1000  AST 17  ALT 15  ALKPHOS 112  BILITOT 0.8  PROT 7.6  ALBUMIN 4.1   CBC:  Recent Labs Lab 09/18/14 1000 09/19/14 0611  WBC 20.3* 21.1*  NEUTROABS 14.2*  --   HGB 14.2 12.7  HCT 44.0 39.0  MCV 96.3 96.7  PLT 242 232     Scheduled Meds: . aspirin EC  81 mg Oral Daily  . budesonide (PULMICORT) nebulizer solution  0.25 mg Nebulization BID  . clopidogrel  75 mg Oral Daily  . enoxaparin (LOVENOX) injection  40 mg Subcutaneous Q24H  . furosemide  40 mg Oral Daily  . ipratropium-albuterol  3 mL Nebulization Q4H  . levofloxacin (LEVAQUIN) IV  500 mg Intravenous Q24H  . lisinopril  20 mg Oral Daily  . methylPREDNISolone (SOLU-MEDROL) injection  60 mg Intravenous 3  times per day  . nicotine  14 mg Transdermal Daily  . pantoprazole  40 mg Oral Daily  . simvastatin  20 mg Oral Daily  . sodium chloride  3 mL Intravenous Q12H    Assessment/Plan:  1. Acute on chronic respiratory failure with hypoxia. Patient initially required BiPAP. Patient currently on oxygen. Patient usually wears oxygen at night and sometimes during the day when she's not feeling well. Will check pulse ox on room air after ambulation in the morning 2. COPD exacerbation- continue Solu-Medrol 60 mg IV every 6 hours and Levaquin. Patient is on nebulizer treatments. Wheeze seems more transmitted from upper airway. Added budesonide  nebulizers yesterday. 3. History of CAD- continue aspirin and Plavix and simvastatin 4. History of diastolic congestive heart failure- no signs of congestive heart failure at this time 5. Gastroesophageal reflux disease without esophagitis continue PPI 6. Hyperlipidemia unspecified continue simvastatin.  Code Status:     Code Status Orders        Start     Ordered   09/18/14 1529  Full code   Continuous     09/18/14 1528     Disposition Plan: Home once breathing better.  Antibiotics:  Levaquin  Time spent: 22 minutes  Loletha Grayer  [facility]

## 2014-09-22 LAB — CREATININE, SERUM
Creatinine, Ser: 0.77 mg/dL (ref 0.44–1.00)
GFR calc Af Amer: >60 mL/min (ref >60)
GFR calc non Af Amer: >60 mL/min (ref >60)

## 2014-09-22 MED ORDER — PREDNISONE 20 MG PO TABS: 40.0000 mg | ORAL_TABLET | Freq: Every day | ORAL | Status: DC

## 2014-09-22 MED ORDER — LEVOFLOXACIN 500 MG PO TABS: 500.0000 mg | ORAL_TABLET | Freq: Every day | ORAL | Status: DC

## 2014-09-22 MED ORDER — PREDNISONE 20 MG PO TABS: ORAL_TABLET | ORAL | Status: DC

## 2014-09-22 NOTE — Progress Notes (Signed)
MD order received to discharge pt home today; Care Management previously addressed pt's home oxygen status (see separate note); verbally reviewed AVS and gave Rxs written for Levaquin and Prednisone to pt; no further questions voiced at this time; pt discharged via wheelchair by nursing to the visitor's entrance

## 2014-09-22 NOTE — Care Management Note (Signed)
Case Management Note  Patient Details  Name: Dawn Donaldson MRN: [id number] Date of Birth: 10/22/1950  Subjective/Objective:   Fax to [place] who supplies [name] oxygen concentrators that she is now on 2LN/C continuously. Prior to this admission [name] has been on oxygen PRN. She has a portable oxygen tank.                  Action/Plan:   Expected Discharge Date:                  Expected Discharge Plan:     In-House Referral:     Discharge planning Services     Post Acute Care Choice:    Choice offered to:     DME Arranged:    DME Agency:     HH Arranged:    [place] Agency:     Status of Service:     Medicare Important Message Given:  Yes-second notification given Date Medicare IM Given:    Medicare IM give by:    Date Additional Medicare IM Given:    Additional Medicare Important Message give by:     If discussed at [place] of Stay Meetings, dates discussed:    Additional Comments:  Rockett,Marilyn A, RN 09/22/2014, 8:47 AM

## 2014-09-22 NOTE — Plan of Care (Signed)
Problem: Discharge Progression Outcomes Goal: Other Discharge Outcomes/Goals Outcome: Progressing Plan of care progress to goal: Pt currently on O2 at 2 liters which is chronic. Pain controlled with administration of Oxycodone 10 mg. Pt has hx of chronic back pain. Nausea: c/o. Zofran given po with relief. No emesis noted. Pt able to consume milk and graham crackers shortly after antiemetic.  Constipation: Pt given Senna two nights in a row for constipation. No results. Pt may need alternative if no bm soon.  Pt for possible discharge home today.

## 2014-09-22 NOTE — Discharge Summary (Signed)
[place] [facility] [place] NAME: Dawn Donaldson    MR#:  [id number]  DATE OF BIRTH:  05-29-1950  DATE OF ADMISSION:  09/18/2014 ADMITTING PHYSICIAN: Bettey Costa, MD  DATE OF DISCHARGE: 09/22/2014  PRIMARY CARE PHYSICIAN: Lorelee Market, MD    ADMISSION DIAGNOSIS:  Hypoxia [R09.02] COPD with exacerbation [J44.1]  DISCHARGE DIAGNOSIS:  Active Problems:   COPD (chronic obstructive pulmonary disease)   SECONDARY DIAGNOSIS:   Past Medical History  Diagnosis Date  . COPD (chronic obstructive pulmonary disease)   . Hypertension   . CAD (coronary artery disease)   . Diastolic CHF   . Hyperlipidemia     HOSPITAL COURSE:   1. Acute on chronic respiratory failure with hypoxia. Patient usually wears oxygen at night. Prior to discharge today will check a pulse ox on room air after ambulation to see if she needs a 24/7. 2. COPD exacerbation patient was started on IV Solu-Medrol and nebulizer treatments and Levaquin. She is gradually improved throughout the hospital course. Patient feels well enough to go home today. She still has a little expiratory wheeze but moving better air. Still has some transmitted sounds from upper airway. We'll give prednisone 40 mg daily for 5 more days and finish up a Levaquin course. 3. History coronary artery disease- on aspirin and Plavix and statin 4. Essential hypertension- continue usual medications 5. Hyperlipidemia unspecified on simvastatin  DISCHARGE CONDITIONS:   Satisfactory  CONSULTS OBTAINED:  None  DRUG ALLERGIES:   Allergies  Allergen Reactions  . Dexlansoprazole Nausea And Vomiting  . Ultram [Tramadol] Itching    DISCHARGE MEDICATIONS:   Current Discharge Medication List    START taking these medications   Details  levofloxacin (LEVAQUIN) 500 MG tablet Take 1 tablet (500 mg total) by mouth daily. Qty: 6 tablet, Refills: 0    predniSONE (DELTASONE) 20 MG tablet Take two tabs  daily for five days Qty: 10 tablet, Refills: 0      CONTINUE these medications which have NOT CHANGED   Details  albuterol (PROVENTIL HFA;VENTOLIN HFA) 108 (90 BASE) MCG/ACT inhaler Inhale 1 puff into the lungs every 6 (six) hours as needed for wheezing or shortness of breath.    albuterol (PROVENTIL) (2.5 MG/3ML) 0.083% nebulizer solution Inhale 3 mLs into the lungs every 4 (four) hours as needed for wheezing or shortness of breath. Qty: 75 mL, Refills: 1    aspirin EC 81 MG tablet Take 81 mg by mouth daily.     clopidogrel (PLAVIX) 75 MG tablet Take 75 mg by mouth daily.     COMBIVENT RESPIMAT 20-100 MCG/ACT AERS respimat Inhale 1 puff into the lungs 4 (four) times daily. Qty: 1 Inhaler, Refills: 2    furosemide (LASIX) 40 MG tablet Take 40 mg by mouth daily.     lisinopril (PRINIVIL,ZESTRIL) 20 MG tablet Take 20 mg by mouth daily.     LORazepam (ATIVAN) 1 MG tablet Take 1 tablet (1 mg total) by mouth every 8 (eight) hours as needed for anxiety. Qty: 20 tablet, Refills: 0    NEXIUM 40 MG capsule Take 40 mg by mouth 2 (two) times daily.     nitroGLYCERIN (NITROSTAT) 0.4 MG SL tablet Place 0.4 mg under the tongue every 5 (five) minutes as needed.     Oxycodone HCl 10 MG TABS Take 1 tablet (10 mg total) by mouth every 6 (six) hours as needed. Qty: 20 tablet, Refills: 0    simvastatin (ZOCOR) 20 MG tablet  Take 20 mg by mouth daily.          DISCHARGE INSTRUCTIONS:   Follow-up PMD one week, Dr. Raul Del 2 weeks  If you experience worsening of your admission symptoms, develop shortness of breath, life threatening emergency, suicidal or homicidal thoughts you must seek medical attention immediately by calling 911 or calling your MD immediately  if symptoms less severe.  You Must read complete instructions/literature along with all the possible adverse reactions/side effects for all the Medicines you take and that have been prescribed to you. Take any new Medicines after you have  completely understood and accept all the possible adverse reactions/side effects.   Please note  You were cared for by a hospitalist during your hospital stay. If you have any questions about your discharge medications or the care you received while you were in the hospital after you are discharged, you can call the unit and asked to speak with the hospitalist on call if the hospitalist that took care of you is not available. Once you are discharged, your primary care physician will handle any further medical issues. Please note that NO REFILLS for any discharge medications will be authorized once you are discharged, as it is imperative that you return to your primary care physician (or establish a relationship with a primary care physician if you do not have one) for your aftercare needs so that they can reassess your need for medications and monitor your lab values.    Today   CHIEF COMPLAINT:   Chief Complaint  Patient presents with  . Shortness of Breath  . Chest Pain    HISTORY OF PRESENT ILLNESS:  Dawn Donaldson  is a 64 y.o. female with a known history of COPD presented with shortness of breath. Found to be in acute respiratory failure and COPD exacerbation.   VITAL SIGNS:  Blood pressure 144/73, pulse 79, temperature 98.3 F (36.8 C), temperature source Oral, resp. rate 18, height 5\' 2"  (1.575 m), weight 91.763 kg (202 lb 4.8 oz), SpO2 95 %.    PHYSICAL EXAMINATION:  GENERAL:  64 y.o.-year-old patient lying in the bed with no acute distress.  EYES: Pupils equal, round, reactive to light and accommodation. No scleral icterus. Extraocular muscles intact.  HEENT: Head atraumatic, normocephalic. Oropharynx and nasopharynx clear.  NECK:  Supple, no jugular venous distention. No thyroid enlargement, no tenderness.  LUNGS: Decreased breath sounds bilaterally but better air entry than admission, positive for expiratory wheezing, no rales,rhonchi or crepitation. No use of accessory  muscles of respiration.  CARDIOVASCULAR: S1, S2 normal. No murmurs, rubs, or gallops.  ABDOMEN: Soft, non-tender, non-distended. Bowel sounds present. No organomegaly or mass.  EXTREMITIES: No pedal edema, cyanosis, or clubbing.  NEUROLOGIC: Cranial nerves II through XII are intact. Muscle strength 5/5 in all extremities. Sensation intact. Gait not checked.  PSYCHIATRIC: The patient is alert and oriented x 3.  SKIN: No obvious rash, lesion, or ulcer.   DATA REVIEW:   CBC  Recent Labs Lab 09/19/14 0611  WBC 21.1*  HGB 12.7  HCT 39.0  PLT 232    Chemistries   Recent Labs Lab 09/18/14 1000 09/22/14 0517  NA 140  --   K 3.8  --   CL 104  --   CO2 29  --   GLUCOSE 109*  --   BUN 9  --   CREATININE 0.68 0.77  CALCIUM 9.3  --   AST 17  --   ALT 15  --   ALKPHOS  112  --   BILITOT 0.8  --     Cardiac Enzymes  Recent Labs Lab 09/18/14 1000  TROPONINI 0.03    Microbiology Results  Results for orders placed or performed during the hospital encounter of 09/18/14  Culture, blood (routine x 2)     Status: None (Preliminary result)   Collection Time: 09/18/14 10:00 AM  Result Value Ref Range Status   Specimen Description BLOOD RIGHT ASSIST CONTROL  Final   Special Requests NONE  Final   Culture NO GROWTH 3 DAYS  Final   Report Status PENDING  Incomplete  Culture, blood (routine x 2)     Status: None (Preliminary result)   Collection Time: 09/18/14 10:08 AM  Result Value Ref Range Status   Specimen Description BLOOD LEFT ASSIST CONTROL  Final   Special Requests BOTTLES DRAWN AEROBIC AND ANAEROBIC 5CC  Final   Culture NO GROWTH 3 DAYS  Final   Report Status PENDING  Incomplete    Management plans discussed with the patient, and she is in agreement.  CODE STATUS:     Code Status Orders        Start     Ordered   09/18/14 1529  Full code   Continuous     09/18/14 1528      TOTAL TIME TAKING CARE OF THIS PATIENT: 35 minutes.    Loletha Grayer M.D on  09/22/2014 at 7:51 AM  Between 7am to 6pm - Pager - [phone]  After 6pm go to [email or web address] - password [email or web address] [place] [facility]  Office  [phone]  CC: Primary care physician; Lorelee Market, MD

## 2014-09-22 NOTE — Discharge Instructions (Signed)
Chronic Obstructive Pulmonary Disease Exacerbation ° Chronic obstructive pulmonary disease (COPD) is a common lung problem. In COPD, the flow of air from the lungs is limited. COPD exacerbations are times that breathing gets worse and you need extra treatment. Without treatment they can be life threatening. If they happen often, your lungs can become more damaged. °HOME CARE °· Do not smoke. °· Avoid tobacco smoke and other things that bother your lungs. °· If given, take your antibiotic medicine as told. Finish the medicine even if you start to feel better. °· Only take medicines as told by your doctor. °· Drink enough fluids to keep your pee (urine) clear or pale yellow (unless your doctor has told you not to). °· Use a cool mist machine (vaporizer). °· If you use oxygen or a machine that turns liquid medicine into a mist (nebulizer), continue to use them as told. °· Keep up with shots (vaccinations) as told by your doctor. °· Exercise regularly. °· Eat healthy foods. °· Keep all doctor visits as told. °GET HELP RIGHT AWAY IF: °· You are very short of breath and it gets worse. °· You have trouble talking. °· You have bad chest pain. °· You have blood in your spit (sputum). °· You have a fever. °· You keep throwing up (vomiting). °· You feel weak, or you pass out (faint). °· You feel confused. °· You keep getting worse. °MAKE SURE YOU:  °· Understand these instructions. °· Will watch your condition. °· Will get help right away if you are not doing well or get worse. °Document Released: 12/10/2010 Document Revised: 10/11/2012 Document Reviewed: 08/25/2012 °Exit[employer]® Patient Information ©2015 [employer]. This information is not intended to replace advice given to you by your health care provider. Make sure you discuss any questions you have with your health care provider. ° °

## 2014-09-23 LAB — CULTURE, BLOOD (ROUTINE X 2)
CULTURE: NO GROWTH
CULTURE: NO GROWTH

## 2014-11-13 ENCOUNTER — Encounter: Payer: Self-pay | Admitting: Emergency Medicine

## 2014-11-13 ENCOUNTER — Emergency Department
Admission: EM | Admit: 2014-11-13 | Discharge: 2014-11-13 | Disposition: A | Discharge status: Home / Self Care | Payer: Medicare Other | Attending: Emergency Medicine | Admitting: Emergency Medicine

## 2014-11-13 ENCOUNTER — Emergency Department: Payer: Medicare Other

## 2014-11-13 DIAGNOSIS — Z7982 Long term (current) use of aspirin: Secondary | ICD-10-CM | POA: Diagnosis not present

## 2014-11-13 DIAGNOSIS — Z7902 Long term (current) use of antithrombotics/antiplatelets: Secondary | ICD-10-CM | POA: Insufficient documentation

## 2014-11-13 DIAGNOSIS — I10 Essential (primary) hypertension: Secondary | ICD-10-CM | POA: Diagnosis not present

## 2014-11-13 DIAGNOSIS — Z79899 Other long term (current) drug therapy: Secondary | ICD-10-CM | POA: Diagnosis not present

## 2014-11-13 DIAGNOSIS — J441 Chronic obstructive pulmonary disease with (acute) exacerbation: Secondary | ICD-10-CM | POA: Insufficient documentation

## 2014-11-13 DIAGNOSIS — R0602 Shortness of breath: Secondary | ICD-10-CM | POA: Diagnosis present

## 2014-11-13 LAB — CBC WITH DIFFERENTIAL/PLATELET
Basophils Absolute: 0.1 10*3/uL (ref 0–0.1)
Basophils Relative: 1 %
EOS PCT: 2 %
Eosinophils Absolute: 0.2 10*3/uL (ref 0–0.7)
HEMATOCRIT: 39.5 % (ref 35.0–47.0)
Hemoglobin: 12.8 g/dL (ref 12.0–16.0)
LYMPHS ABS: 3.3 10*3/uL (ref 1.0–3.6)
LYMPHS PCT: 38 %
MCH: 31.2 pg (ref 26.0–34.0)
MCHC: 32.4 g/dL (ref 32.0–36.0)
MCV: 96.5 fL (ref 80.0–100.0)
MONO ABS: 0.8 10*3/uL (ref 0.2–0.9)
Monocytes Relative: 10 %
NEUTROS ABS: 4.3 10*3/uL (ref 1.4–6.5)
Neutrophils Relative %: 49 %
PLATELETS: 221 10*3/uL (ref 150–440)
RBC: 4.09 MIL/uL (ref 3.80–5.20)
RDW: 13.7 % (ref 11.5–14.5)
WBC: 8.7 10*3/uL (ref 3.6–11.0)

## 2014-11-13 LAB — COMPREHENSIVE METABOLIC PANEL
ALK PHOS: 94 U/L (ref 38–126)
ALT: 15 U/L (ref 14–54)
AST: 18 U/L (ref 15–41)
Albumin: 3.8 g/dL (ref 3.5–5.0)
Anion gap: 5 (ref 5–15)
BILIRUBIN TOTAL: 0.5 mg/dL (ref 0.3–1.2)
BUN: 8 mg/dL (ref 6–20)
CALCIUM: 9.1 mg/dL (ref 8.9–10.3)
CHLORIDE: 106 mmol/L (ref 101–111)
CO2: 31 mmol/L (ref 22–32)
CREATININE: 0.65 mg/dL (ref 0.44–1.00)
Glucose, Bld: 103 mg/dL — ABNORMAL HIGH (ref 65–99)
Potassium: 3.3 mmol/L — ABNORMAL LOW (ref 3.5–5.1)
Sodium: 142 mmol/L (ref 135–145)
Total Protein: 6.8 g/dL (ref 6.5–8.1)

## 2014-11-13 LAB — BRAIN NATRIURETIC PEPTIDE: B Natriuretic Peptide: 161 pg/mL — ABNORMAL HIGH (ref 0.0–100.0)

## 2014-11-13 LAB — TROPONIN I: Troponin I: <0.03 ng/mL (ref <0.031)

## 2014-11-13 MED ORDER — ALBUTEROL (5 MG/ML) CONTINUOUS INHALATION SOLN
15.0000 mg/h | INHALATION_SOLUTION | Freq: Once | RESPIRATORY_TRACT | Status: AC
  Administered 2014-11-13: 15 mg/h via RESPIRATORY_TRACT

## 2014-11-13 MED ORDER — PREDNISONE 20 MG PO TABS: ORAL_TABLET | ORAL | Status: AC

## 2014-11-13 MED ORDER — BENZONATATE 100 MG PO CAPS: 100.0000 mg | ORAL_CAPSULE | Freq: Three times a day (TID) | ORAL | Status: DC | PRN

## 2014-11-13 MED ORDER — ALBUTEROL SULFATE (2.5 MG/3ML) 0.083% IN NEBU
INHALATION_SOLUTION | RESPIRATORY_TRACT | Status: AC
  Administered 2014-11-13: 15 mg
  Filled 2014-11-13: qty 15

## 2014-11-13 MED ORDER — METHYLPREDNISOLONE SODIUM SUCC 125 MG IJ SOLR
125.0000 mg | Freq: Once | INTRAMUSCULAR | Status: AC
  Administered 2014-11-13: 125 mg via INTRAVENOUS
  Filled 2014-11-13: qty 2

## 2014-11-13 MED ORDER — IPRATROPIUM BROMIDE 0.02 % IN SOLN
0.5000 mg | Freq: Once | RESPIRATORY_TRACT | Status: AC
  Administered 2014-11-13: 0.5 mg via RESPIRATORY_TRACT
  Filled 2014-11-13: qty 2.5

## 2014-11-13 MED ORDER — LISINOPRIL 20 MG PO TABS
20.0000 mg | ORAL_TABLET | Freq: Once | ORAL | Status: AC
  Administered 2014-11-13: 20 mg via ORAL
  Filled 2014-11-13: qty 1

## 2014-11-13 MED ORDER — IPRATROPIUM-ALBUTEROL 0.5-2.5 (3) MG/3ML IN SOLN
3.0000 mL | Freq: Once | RESPIRATORY_TRACT | Status: AC
  Administered 2014-11-13: 3 mL via RESPIRATORY_TRACT
  Filled 2014-11-13: qty 3

## 2014-11-13 NOTE — ED Notes (Signed)
Patient comes with complaints of shortness of breath that started yesterday and worsened today.  Patient with history of COPD and wears home 02 at 2L nasal cannula.

## 2014-11-13 NOTE — Discharge Instructions (Signed)
Take prednisone as prescribed.  Use albuterol every 4 hrs for 2 days then as needed.   Take tessalon pearls for cough.   See your doctor in 2-3 days.   Use oxygen at home as needed. You may need to use it more often.   Return to ER if you have worse cough, trouble breathing, shortness of breath, wheezing.

## 2014-11-13 NOTE — ED Provider Notes (Addendum)
CSN: [id number]     Arrival date & time 11/13/14  1120 History   First MD Initiated Contact with Patient 11/13/14 1156     Chief Complaint  Patient presents with  . Shortness of Breath     (Consider location/radiation/quality/duration/timing/severity/associated sxs/prior Treatment) The history is provided by the patient.  Dawn Donaldson is a 64 y.o. female hx of COPD, diastolic CHF on 2 L [place] at night, here presenting with shortness of breath, wheezing. Has some nonproductive cough that got worse yesterday. Denies any fevers or chills. States that she has to use oxygen today. Denies any leg swelling. Denies any chest pain. She did not take her medicines today. She was admitted about 6 weeks ago for COPD exacerbation and finish her course steroids and Levaquin.   Past Medical History  Diagnosis Date  . COPD (chronic obstructive pulmonary disease) ([place])   . Hypertension   . CAD (coronary artery disease)   . Diastolic CHF ([place])   . Hyperlipidemia    History reviewed. No pertinent past surgical history. History reviewed. No pertinent family history. Social History  Substance Use Topics  . Smoking status: Never Smoker   . Smokeless tobacco: None  . Alcohol Use: No   OB History    No data available     Review of Systems  Respiratory: Positive for shortness of breath.   All other systems reviewed and are negative.     Allergies  Dexlansoprazole and Ultram  Home Medications   Prior to Admission medications   Medication Sig Start Date End Date Taking? Authorizing Provider  albuterol (PROVENTIL HFA;VENTOLIN HFA) 108 (90 BASE) MCG/ACT inhaler Inhale 1 puff into the lungs every 6 (six) hours as needed for wheezing or shortness of breath.   Yes Historical Provider, MD  albuterol (PROVENTIL) (2.5 MG/3ML) 0.083% nebulizer solution Inhale 3 mLs into the lungs every 4 (four) hours as needed for wheezing or shortness of breath. 05/27/14  Yes Gladstone Lighter, MD  aspirin EC 81 MG  tablet Take 81 mg by mouth daily.    Yes Historical Provider, MD  clopidogrel (PLAVIX) 75 MG tablet Take 75 mg by mouth daily.  05/14/14  Yes Historical Provider, MD  COMBIVENT RESPIMAT 20-100 MCG/ACT AERS respimat Inhale 1 puff into the lungs 4 (four) times daily. 05/27/14  Yes Gladstone Lighter, MD  furosemide (LASIX) 40 MG tablet Take 40 mg by mouth daily.  05/14/14  Yes Historical Provider, MD  lisinopril (PRINIVIL,ZESTRIL) 20 MG tablet Take 20 mg by mouth daily.  05/14/14  Yes Historical Provider, MD  LORazepam (ATIVAN) 1 MG tablet Take 1 tablet (1 mg total) by mouth every 8 (eight) hours as needed for anxiety. 05/27/14  Yes Gladstone Lighter, MD  NEXIUM 40 MG capsule Take 40 mg by mouth 2 (two) times daily.  05/14/14  Yes Historical Provider, MD  nitroGLYCERIN (NITROSTAT) 0.4 MG SL tablet Place 0.4 mg under the tongue every 5 (five) minutes as needed.  05/24/12  Yes Historical Provider, MD  Oxycodone HCl 10 MG TABS Take 1 tablet (10 mg total) by mouth every 6 (six) hours as needed. 05/27/14  Yes Gladstone Lighter, MD  simvastatin (ZOCOR) 20 MG tablet Take 20 mg by mouth daily.  05/14/14  Yes Historical Provider, MD   BP 159/67 mmHg  Pulse 97  Temp(Src) 98.5 F (36.9 C) (Oral)  Resp 32  Ht 5\' 1"  (1.549 m)  Wt 199 lb (90.266 kg)  BMI 37.62 kg/m2  SpO2 99% Physical Exam  Constitutional: She  is oriented to person, place, and time.  tachypneic   HENT:  Head: Normocephalic.  Mouth/Throat: Oropharynx is clear and moist.  Eyes: Conjunctivae are normal. Pupils are equal, round, and reactive to light.  Neck: Normal range of motion. Neck supple.  Cardiovascular: Normal rate, regular rhythm and normal heart sounds.   Pulmonary/Chest:  Tachypneic, mild wheezing throughout. No crackles   Abdominal: Soft. Bowel sounds are normal. She exhibits no distension. There is no tenderness. There is no rebound.  Musculoskeletal: Normal range of motion. She exhibits no edema or tenderness.  Neurological: She is  alert and oriented to person, place, and time. No cranial nerve deficit. Coordination normal.  Skin: Skin is warm and dry.  Psychiatric: She has a normal mood and affect. Her behavior is normal. Judgment and thought content normal.  Nursing note and vitals reviewed.   ED Course  Procedures (including critical care time) Labs Review Labs Reviewed  COMPREHENSIVE METABOLIC PANEL - Abnormal; Notable for the following:    Potassium 3.3 (*)    Glucose, Bld 103 (*)    All other components within normal limits  BRAIN NATRIURETIC PEPTIDE - Abnormal; Notable for the following:    B Natriuretic Peptide 161.0 (*)    All other components within normal limits  CBC WITH DIFFERENTIAL/PLATELET  TROPONIN I    Imaging Review Dg Chest 2 View  11/13/2014  CLINICAL DATA:  Increasing shortness of breath.  COPD. EXAM: CHEST  2 VIEW COMPARISON:  05/25/2014 FINDINGS: Heart size and pulmonary vascularity are normal. The lungs are hyperinflated but clear. No osseous abnormality. IMPRESSION: No acute abnormality. Hyperinflated lungs consistent with the history of COPD. Electronically Signed   By: Lorriane Shire M.D.   On: 11/13/2014 13:27   I have personally reviewed and evaluated these images and lab results as part of my medical decision-making.   EKG Interpretation None       ED ECG REPORT I, Paulmichael Schreck, the attending physician, personally viewed and interpreted this ECG.   Date: 11/13/2014  EKG Time: 11:50 AM  Rate: 89  Rhythm: normal EKG, normal sinus rhythm  Axis: normal  Intervals:none  ST&T Change: nonspecific    MDM   Final diagnoses:  None    Dawn Donaldson is a 64 y.o. female here with cough, wheezing. Likely COPD vs CHF vs pneumonia. Hypertensive in the ED but didn't take BP meds. Will get labs, BNP, CXR. Will give lisinopril (home med) and reassess.   3:08 PM BP improved to 160 from 220 after lisinopril. Labs at baseline, BNP 161, CXR showed no obvious pulmonary edema. Given 1  neb and then continuous neb and felt better. Still has wheezing on exam but improved air movement. Able to ambulate with no oxygen and maintained oxygen around 95%. Offered admission or close follow up and she wants to go home. Will dc home with albuterol, steroid taper, and tessalon pearls for cough. No fever and no greenish sputum to warrant abx.      Wandra Arthurs, MD 11/13/14 5498  Wandra Arthurs, MD 11/13/14 [phone]

## 2014-12-28 ENCOUNTER — Encounter: Payer: Self-pay | Admitting: Emergency Medicine

## 2014-12-28 ENCOUNTER — Emergency Department: Payer: Medicare Other

## 2014-12-28 ENCOUNTER — Inpatient Hospital Stay
Admission: EM | Admit: 2014-12-28 | Discharge: 2015-01-03 | DRG: 190 | Disposition: A | Discharge status: Home / Self Care | Payer: Medicare Other | Attending: Internal Medicine | Admitting: Internal Medicine

## 2014-12-28 DIAGNOSIS — E785 Hyperlipidemia, unspecified: Secondary | ICD-10-CM | POA: Diagnosis present

## 2014-12-28 DIAGNOSIS — Z9981 Dependence on supplemental oxygen: Secondary | ICD-10-CM | POA: Diagnosis not present

## 2014-12-28 DIAGNOSIS — I2510 Atherosclerotic heart disease of native coronary artery without angina pectoris: Secondary | ICD-10-CM | POA: Diagnosis present

## 2014-12-28 DIAGNOSIS — G4733 Obstructive sleep apnea (adult) (pediatric): Secondary | ICD-10-CM | POA: Diagnosis present

## 2014-12-28 DIAGNOSIS — R0902 Hypoxemia: Secondary | ICD-10-CM

## 2014-12-28 DIAGNOSIS — K5900 Constipation, unspecified: Secondary | ICD-10-CM | POA: Diagnosis present

## 2014-12-28 DIAGNOSIS — R0602 Shortness of breath: Secondary | ICD-10-CM

## 2014-12-28 DIAGNOSIS — Z888 Allergy status to other drugs, medicaments and biological substances status: Secondary | ICD-10-CM

## 2014-12-28 DIAGNOSIS — Z79899 Other long term (current) drug therapy: Secondary | ICD-10-CM | POA: Diagnosis not present

## 2014-12-28 DIAGNOSIS — I5032 Chronic diastolic (congestive) heart failure: Secondary | ICD-10-CM | POA: Diagnosis present

## 2014-12-28 DIAGNOSIS — J441 Chronic obstructive pulmonary disease with (acute) exacerbation: Principal | ICD-10-CM | POA: Diagnosis present

## 2014-12-28 DIAGNOSIS — I110 Hypertensive heart disease with heart failure: Secondary | ICD-10-CM | POA: Diagnosis present

## 2014-12-28 DIAGNOSIS — Z87891 Personal history of nicotine dependence: Secondary | ICD-10-CM

## 2014-12-28 DIAGNOSIS — J9621 Acute and chronic respiratory failure with hypoxia: Secondary | ICD-10-CM | POA: Diagnosis present

## 2014-12-28 LAB — CBC WITH DIFFERENTIAL/PLATELET
BASOS PCT: 0 %
Basophils Absolute: 0 10*3/uL (ref 0–0.1)
EOS ABS: 0.4 10*3/uL (ref 0–0.7)
Eosinophils Relative: 3 %
HCT: 42.2 % (ref 35.0–47.0)
HEMOGLOBIN: 13.8 g/dL (ref 12.0–16.0)
Lymphocytes Relative: 39 %
Lymphs Abs: 4.8 10*3/uL — ABNORMAL HIGH (ref 1.0–3.6)
MCH: 30.8 pg (ref 26.0–34.0)
MCHC: 32.8 g/dL (ref 32.0–36.0)
MCV: 94 fL (ref 80.0–100.0)
Monocytes Absolute: 0.7 10*3/uL (ref 0.2–0.9)
Monocytes Relative: 6 %
NEUTROS PCT: 52 %
Neutro Abs: 6.3 10*3/uL (ref 1.4–6.5)
Platelets: 226 10*3/uL (ref 150–440)
RBC: 4.49 MIL/uL (ref 3.80–5.20)
RDW: 13.7 % (ref 11.5–14.5)
WBC: 12.3 10*3/uL — AB (ref 3.6–11.0)

## 2014-12-28 LAB — COMPREHENSIVE METABOLIC PANEL
ALT: 14 U/L (ref 14–54)
AST: 18 U/L (ref 15–41)
Albumin: 3.9 g/dL (ref 3.5–5.0)
Alkaline Phosphatase: 93 U/L (ref 38–126)
Anion gap: 4 — ABNORMAL LOW (ref 5–15)
BILIRUBIN TOTAL: 0.2 mg/dL — AB (ref 0.3–1.2)
BUN: 10 mg/dL (ref 6–20)
CHLORIDE: 107 mmol/L (ref 101–111)
CO2: 29 mmol/L (ref 22–32)
Calcium: 9 mg/dL (ref 8.9–10.3)
Creatinine, Ser: 0.71 mg/dL (ref 0.44–1.00)
Glucose, Bld: 120 mg/dL — ABNORMAL HIGH (ref 65–99)
POTASSIUM: 3.6 mmol/L (ref 3.5–5.1)
SODIUM: 140 mmol/L (ref 135–145)
TOTAL PROTEIN: 6.9 g/dL (ref 6.5–8.1)

## 2014-12-28 LAB — BRAIN NATRIURETIC PEPTIDE: B NATRIURETIC PEPTIDE 5: 61 pg/mL (ref 0.0–100.0)

## 2014-12-28 LAB — TROPONIN I

## 2014-12-28 MED ORDER — ALBUTEROL SULFATE (2.5 MG/3ML) 0.083% IN NEBU
5.0000 mg | INHALATION_SOLUTION | Freq: Once | RESPIRATORY_TRACT | Status: AC
  Administered 2014-12-28: 5 mg via RESPIRATORY_TRACT
  Filled 2014-12-28: qty 6

## 2014-12-28 MED ORDER — METHYLPREDNISOLONE SODIUM SUCC 125 MG IJ SOLR
60.0000 mg | Freq: Three times a day (TID) | INTRAMUSCULAR | Status: DC
  Administered 2014-12-28 – 2014-12-31 (×9): 60 mg via INTRAVENOUS
  Filled 2014-12-28 (×9): qty 2

## 2014-12-28 MED ORDER — IPRATROPIUM-ALBUTEROL 0.5-2.5 (3) MG/3ML IN SOLN
9.0000 mL | Freq: Once | RESPIRATORY_TRACT | Status: AC
  Administered 2014-12-28: 9 mL via RESPIRATORY_TRACT
  Filled 2014-12-28: qty 9

## 2014-12-28 MED ORDER — ASPIRIN EC 81 MG PO TBEC
81.0000 mg | DELAYED_RELEASE_TABLET | Freq: Every day | ORAL | Status: DC
  Administered 2014-12-29 – 2015-01-03 (×6): 81 mg via ORAL
  Filled 2014-12-28 (×6): qty 1

## 2014-12-28 MED ORDER — ACETAMINOPHEN 650 MG RE SUPP: 650.0000 mg | Freq: Four times a day (QID) | RECTAL | Status: DC | PRN

## 2014-12-28 MED ORDER — IPRATROPIUM-ALBUTEROL 0.5-2.5 (3) MG/3ML IN SOLN
3.0000 mL | RESPIRATORY_TRACT | Status: DC
  Administered 2014-12-28 – 2015-01-03 (×33): 3 mL via RESPIRATORY_TRACT
  Filled 2014-12-28 (×36): qty 3

## 2014-12-28 MED ORDER — SODIUM CHLORIDE 0.9 % IV BOLUS (SEPSIS)
500.0000 mL | Freq: Once | INTRAVENOUS | Status: AC
  Administered 2014-12-28: 500 mL via INTRAVENOUS

## 2014-12-28 MED ORDER — SODIUM CHLORIDE 0.9 % IJ SOLN
3.0000 mL | Freq: Two times a day (BID) | INTRAMUSCULAR | Status: DC
  Administered 2014-12-28 – 2015-01-03 (×12): 3 mL via INTRAVENOUS

## 2014-12-28 MED ORDER — CLOPIDOGREL BISULFATE 75 MG PO TABS
75.0000 mg | ORAL_TABLET | Freq: Every day | ORAL | Status: DC
  Administered 2014-12-29 – 2015-01-03 (×6): 75 mg via ORAL
  Filled 2014-12-28 (×6): qty 1

## 2014-12-28 MED ORDER — METOPROLOL TARTRATE 1 MG/ML IV SOLN: 5.0000 mg | INTRAVENOUS | Status: DC | PRN

## 2014-12-28 MED ORDER — OXYCODONE HCL ER 10 MG PO T12A
EXTENDED_RELEASE_TABLET | ORAL | Status: AC
  Administered 2014-12-28: 10 mg
  Filled 2014-12-28: qty 1

## 2014-12-28 MED ORDER — LORAZEPAM 1 MG PO TABS
1.0000 mg | ORAL_TABLET | Freq: Three times a day (TID) | ORAL | Status: DC | PRN
  Administered 2015-01-01 – 2015-01-02 (×4): 1 mg via ORAL
  Filled 2014-12-28 (×4): qty 1

## 2014-12-28 MED ORDER — ENOXAPARIN SODIUM 40 MG/0.4ML ~~LOC~~ SOLN
40.0000 mg | SUBCUTANEOUS | Status: DC
  Administered 2014-12-28 – 2015-01-01 (×5): 40 mg via SUBCUTANEOUS
  Filled 2014-12-28 (×7): qty 0.4

## 2014-12-28 MED ORDER — SIMVASTATIN 20 MG PO TABS
20.0000 mg | ORAL_TABLET | Freq: Every day | ORAL | Status: DC
  Administered 2014-12-28 – 2015-01-03 (×7): 20 mg via ORAL
  Filled 2014-12-28 (×7): qty 1

## 2014-12-28 MED ORDER — NITROGLYCERIN 0.4 MG SL SUBL: 0.4000 mg | SUBLINGUAL_TABLET | SUBLINGUAL | Status: DC | PRN

## 2014-12-28 MED ORDER — OXYCODONE HCL 5 MG PO TABS
10.0000 mg | ORAL_TABLET | Freq: Four times a day (QID) | ORAL | Status: DC | PRN
  Administered 2014-12-28 – 2015-01-03 (×12): 10 mg via ORAL
  Filled 2014-12-28 (×5): qty 2
  Filled 2014-12-28: qty 1
  Filled 2014-12-28 (×7): qty 2

## 2014-12-28 MED ORDER — ACETAMINOPHEN 325 MG PO TABS: 650.0000 mg | ORAL_TABLET | Freq: Four times a day (QID) | ORAL | Status: DC | PRN

## 2014-12-28 MED ORDER — METHYLPREDNISOLONE SODIUM SUCC 125 MG IJ SOLR
125.0000 mg | Freq: Once | INTRAMUSCULAR | Status: AC
  Administered 2014-12-28: 125 mg via INTRAVENOUS
  Filled 2014-12-28: qty 2

## 2014-12-28 MED ORDER — BENZONATATE 100 MG PO CAPS: 100.0000 mg | ORAL_CAPSULE | Freq: Three times a day (TID) | ORAL | Status: DC | PRN

## 2014-12-28 MED ORDER — DOCUSATE SODIUM 100 MG PO CAPS
100.0000 mg | ORAL_CAPSULE | Freq: Two times a day (BID) | ORAL | Status: DC
  Administered 2014-12-28 – 2015-01-03 (×12): 100 mg via ORAL
  Filled 2014-12-28 (×12): qty 1

## 2014-12-28 MED ORDER — ALBUTEROL SULFATE (2.5 MG/3ML) 0.083% IN NEBU
2.5000 mg | INHALATION_SOLUTION | RESPIRATORY_TRACT | Status: DC | PRN
  Administered 2014-12-29 – 2015-01-02 (×5): 2.5 mg via RESPIRATORY_TRACT
  Filled 2014-12-28 (×5): qty 3

## 2014-12-28 MED ORDER — PANTOPRAZOLE SODIUM 40 MG PO TBEC
40.0000 mg | DELAYED_RELEASE_TABLET | Freq: Every day | ORAL | Status: DC
  Administered 2014-12-28 – 2015-01-03 (×6): 40 mg via ORAL
  Filled 2014-12-28 (×6): qty 1

## 2014-12-28 MED ORDER — ALBUTEROL SULFATE (2.5 MG/3ML) 0.083% IN NEBU: 3.0000 mL | INHALATION_SOLUTION | RESPIRATORY_TRACT | Status: DC | PRN

## 2014-12-28 MED ORDER — FUROSEMIDE 40 MG PO TABS
40.0000 mg | ORAL_TABLET | Freq: Every day | ORAL | Status: DC
  Administered 2014-12-29 – 2015-01-03 (×6): 40 mg via ORAL
  Filled 2014-12-28 (×6): qty 1

## 2014-12-28 MED ORDER — DEXTROSE 5 % IV SOLN
500.0000 mg | INTRAVENOUS | Status: DC
  Administered 2014-12-28 – 2014-12-31 (×4): 500 mg via INTRAVENOUS
  Filled 2014-12-28 (×5): qty 500

## 2014-12-28 MED ORDER — LISINOPRIL 20 MG PO TABS
20.0000 mg | ORAL_TABLET | Freq: Every day | ORAL | Status: DC
  Administered 2014-12-29 – 2015-01-03 (×6): 20 mg via ORAL
  Filled 2014-12-28 (×9): qty 1

## 2014-12-28 MED ORDER — ONDANSETRON HCL 4 MG/2ML IJ SOLN
4.0000 mg | Freq: Four times a day (QID) | INTRAMUSCULAR | Status: DC | PRN
  Administered 2014-12-28 – 2015-01-03 (×11): 4 mg via INTRAVENOUS
  Filled 2014-12-28 (×11): qty 2

## 2014-12-28 NOTE — H&P (Signed)
[facility] [place] NAME: Dawn Donaldson    MR#:  [id number]  DATE OF BIRTH:  Dec 28, 1950  DATE OF ADMISSION:  12/28/2014  PRIMARY CARE PHYSICIAN: Lorelee Market, MD   REQUESTING/REFERRING PHYSICIAN: Orbie Pyo, MD  CHIEF COMPLAINT:   Shortness of breath HISTORY OF PRESENT ILLNESS:  Dawn Donaldson  is a 64 y.o. female with a known history of COPD with chronic hypoxic respiratory failure lives on 2 L of oxygen by nasal cannula is presenting to the ED with a chief complaint of 3-4 day history of worsening of shortness of breath. Today patient was using 4 L of oxygen via nasal cannula as that she was tight in her chest but no improvement. She used nebulizers with no relief. Patient's shortness of breath is associated with cough. Patient used to smoke and quit smoking 1 month ago. Denies any fever or sick contacts.  PAST MEDICAL HISTORY:   Past Medical History  Diagnosis Date  . COPD (chronic obstructive pulmonary disease) ([place])   . Hypertension   . CAD (coronary artery disease)   . Diastolic CHF ([place])   . Hyperlipidemia     PAST SURGICAL HISTOIRY:  History reviewed. No pertinent past surgical history.  SOCIAL HISTORY:   Social History  Substance Use Topics  . Smoking status: Never Smoker   . Smokeless tobacco: Not on file  . Alcohol Use: No    FAMILY HISTORY:  No family history on file.  DRUG ALLERGIES:   Allergies  Allergen Reactions  . Dexlansoprazole Nausea And Vomiting  . Ultram [Tramadol] Itching    REVIEW OF SYSTEMS:  CONSTITUTIONAL: No fever, fatigue or weakness.  EYES: No blurred or double vision.  EARS, NOSE, AND THROAT: No tinnitus or ear pain.  RESPIRATORY: Reporting dry cough, shortness of breath, wheezing , denies hemoptysis.  CARDIOVASCULAR: No chest pain, orthopnea, edema.  GASTROINTESTINAL: No nausea, vomiting, diarrhea or abdominal pain.  GENITOURINARY: No dysuria, hematuria.   ENDOCRINE: No polyuria, nocturia,  HEMATOLOGY: No anemia, easy bruising or bleeding SKIN: No rash or lesion. MUSCULOSKELETAL: No joint pain or arthritis.   NEUROLOGIC: No tingling, numbness, weakness.  PSYCHIATRY: No anxiety or depression.   MEDICATIONS AT HOME:   Prior to Admission medications   Medication Sig Start Date End Date Taking? Authorizing Provider  albuterol (PROVENTIL HFA;VENTOLIN HFA) 108 (90 BASE) MCG/ACT inhaler Inhale 1 puff into the lungs every 6 (six) hours as needed for wheezing or shortness of breath.    Historical Provider, MD  albuterol (PROVENTIL) (2.5 MG/3ML) 0.083% nebulizer solution Inhale 3 mLs into the lungs every 4 (four) hours as needed for wheezing or shortness of breath. 05/27/14   Gladstone Lighter, MD  aspirin EC 81 MG tablet Take 81 mg by mouth daily.     Historical Provider, MD  benzonatate (TESSALON) 100 MG capsule Take 1 capsule (100 mg total) by mouth 3 (three) times daily as needed for cough. 11/13/14   Wandra Arthurs, MD  clopidogrel (PLAVIX) 75 MG tablet Take 75 mg by mouth daily.  05/14/14   Historical Provider, MD  COMBIVENT RESPIMAT 20-100 MCG/ACT AERS respimat Inhale 1 puff into the lungs 4 (four) times daily. 05/27/14   Gladstone Lighter, MD  furosemide (LASIX) 40 MG tablet Take 40 mg by mouth daily.  05/14/14   Historical Provider, MD  lisinopril (PRINIVIL,ZESTRIL) 20 MG tablet Take 20 mg by mouth daily.  05/14/14   Historical Provider, MD  LORazepam (ATIVAN) 1 MG tablet  Take 1 tablet (1 mg total) by mouth every 8 (eight) hours as needed for anxiety. 05/27/14   Gladstone Lighter, MD  NEXIUM 40 MG capsule Take 40 mg by mouth 2 (two) times daily.  05/14/14   Historical Provider, MD  nitroGLYCERIN (NITROSTAT) 0.4 MG SL tablet Place 0.4 mg under the tongue every 5 (five) minutes as needed.  05/24/12   Historical Provider, MD  Oxycodone HCl 10 MG TABS Take 1 tablet (10 mg total) by mouth every 6 (six) hours as needed. 05/27/14   Gladstone Lighter, MD   simvastatin (ZOCOR) 20 MG tablet Take 20 mg by mouth daily.  05/14/14   Historical Provider, MD      VITAL SIGNS:  Blood pressure 157/79, pulse 97, temperature 98.6 F (37 C), temperature source Oral, resp. rate 32, height 5\' 1"  (1.549 m), weight 90.719 kg (200 lb), SpO2 97 %.  PHYSICAL EXAMINATION:  GENERAL:  64 y.o.-year-old patient lying in the bed with no acute distress.  EYES: Pupils equal, round, reactive to light and accommodation. No scleral icterus. Extraocular muscles intact.  HEENT: Head atraumatic, normocephalic. Oropharynx and nasopharynx clear.  NECK:  Supple, no jugular venous distention. No thyroid enlargement, no tenderness.  LUNGS: Coarse bronchial breath sounds bilaterally, diffuse wheezing, no rales,rhonchi or crepitation. No use of accessory muscles of respiration.  CARDIOVASCULAR: S1, S2 normal. No murmurs, rubs, or gallops.  ABDOMEN: Soft, nontender, nondistended. Bowel sounds present. No organomegaly or mass.  EXTREMITIES: No pedal edema, cyanosis, or clubbing.  NEUROLOGIC: Cranial nerves II through XII are intact. Muscle strength 5/5 in all extremities. Sensation intact. Gait not checked.  PSYCHIATRIC: The patient is alert and oriented x 3.  SKIN: No obvious rash, lesion, or ulcer.   LABORATORY PANEL:   CBC  Recent Labs Lab 12/28/14 1407  WBC 12.3*  HGB 13.8  HCT 42.2  PLT 226   ------------------------------------------------------------------------------------------------------------------  Chemistries   Recent Labs Lab 12/28/14 1407  NA 140  K 3.6  CL 107  CO2 29  GLUCOSE 120*  BUN 10  CREATININE 0.71  CALCIUM 9.0  AST 18  ALT 14  ALKPHOS 93  BILITOT 0.2*   ------------------------------------------------------------------------------------------------------------------  Cardiac Enzymes  Recent Labs Lab 12/28/14 1407  TROPONINI <0.03    ------------------------------------------------------------------------------------------------------------------  RADIOLOGY:  Dg Chest 1 View  12/28/2014  CLINICAL DATA:  C/o worsening sob and wheezing for the past 3-4 days, hx of COPD, htn, cad, and chf; on home o2 EXAM: CHEST  1 VIEW COMPARISON:  11/13/2014 FINDINGS: Lungs are clear, hyperinflated. Heart size and mediastinal contours are within normal limits. No effusion.  No pneumothorax. Visualized skeletal structures are unremarkable. IMPRESSION: No acute cardiopulmonary disease. Electronically Signed   By: Lucrezia Europe M.D.   On: 12/28/2014 15:12    EKG:   Orders placed or performed during the hospital encounter of 12/28/14  . EKG 12-Lead  . EKG 12-Lead    IMPRESSION AND PLAN:   1. Acute on chronic hypoxic respiratory failure secondary to COPD exacerbation Provide solum Medrol, oxygen, nebulizer treatments and azithromycin empirically Sputum culture and sensitivity Await clinical improvement  2. Essential hypertension-blood pressure was initially elevated We'll continue her home medication Lasix and lisinopril and titrate as needed basis  3. Obstructive sleep apnea patient is not currently using CPAP Will encourage her to use CPAP daily at bedtime  4. Chronic diastolic congestive heart failure currently not fluid overloaded We will continue her home medication Lasix, ACE inhibitor, aspirin and statin Monitor daily weights  Provide GI  prophylaxis with PPI and DVT prophylaxis with Lovenox subcutaneous    All the records are reviewed and case discussed with ED provider. Management plans discussed with the patient, family and they are in agreement.  CODE STATUS: Working, Museum/gallery curator TOTAL TIME TAKING CARE OF THIS PATIENT: 45 minutes.    Nicholes Mango M.D on 12/28/2014 at 6:53 PM  Between 7am to 6pm - Pager - [phone]  After 6pm go to [email or web address] - password [email or web address] [facility]   Office  [phone]  CC: Primary care physician; Lorelee Market, MD

## 2014-12-28 NOTE — ED Provider Notes (Signed)
-----------------------------------------   4:04 PM on 12/28/2014 -----------------------------------------  Patient remains with reassuring oxygen saturation but significant persistent wheeze. Blood pressure is trending down she becomes more comfortable. She was signed out to me at 3:00 as likely needing admission after her nebulizer machine completely clears. She has not. We'll give her more albuterol. Her chest x-ray is clear, I've added a BNP onto her blood work and I talked to the hospitalist who has graciously agreed to admit the patient.   Schuyler Amor, MD 12/28/14 [phone]

## 2014-12-28 NOTE — ED Notes (Signed)
C/o worsening sob and wheezing for the past 3-4 days, hx of COPD, on home o2

## 2014-12-28 NOTE — ED Provider Notes (Signed)
[facility] Emergency Department Provider Note  ____________________________________________  Time seen: Approximately 2:05 PM  I have reviewed the triage vital signs and the nursing notes.   HISTORY  Chief Complaint Shortness of Breath    HPI Dawn Donaldson is a 64 y.o. female with a history of COPD on home oxygen at 2 L was presenting with 3-4 days of worsening shortness of breath and cough. She denies any chest pain. Says that her shortness of breath is worse when she is walking and now she is only able to take a few steps without becoming severely short of breath. She says that lately she has had to increase her oxygen to 3 and 4 L noted to stay comfortable. She describes throat pain but thinks it may be from coughing. She does not report any "sore throat" to the inside of her throat or throat swelling. She says that the pain is along the anterior and right side of her throat. Denies any fever or body aches.   Past Medical History  Diagnosis Date  . COPD (chronic obstructive pulmonary disease) ([place])   . Hypertension   . CAD (coronary artery disease)   . Diastolic CHF ([place])   . Hyperlipidemia     Patient Active Problem List   Diagnosis Date Noted  . COPD (chronic obstructive pulmonary disease) ([place]) 05/23/2014    History reviewed. No pertinent past surgical history.  Current Outpatient Rx  Name  Route  Sig  Dispense  Refill  . albuterol (PROVENTIL HFA;VENTOLIN HFA) 108 (90 BASE) MCG/ACT inhaler   Inhalation   Inhale 1 puff into the lungs every 6 (six) hours as needed for wheezing or shortness of breath.         Marland Kitchen albuterol (PROVENTIL) (2.5 MG/3ML) 0.083% nebulizer solution   Inhalation   Inhale 3 mLs into the lungs every 4 (four) hours as needed for wheezing or shortness of breath.   75 mL   1   . aspirin EC 81 MG tablet   Oral   Take 81 mg by mouth daily.          . benzonatate (TESSALON) 100 MG capsule   Oral   Take 1 capsule  (100 mg total) by mouth 3 (three) times daily as needed for cough.   20 capsule   0   . clopidogrel (PLAVIX) 75 MG tablet   Oral   Take 75 mg by mouth daily.          . COMBIVENT RESPIMAT 20-100 MCG/ACT AERS respimat   Inhalation   Inhale 1 puff into the lungs 4 (four) times daily.   1 Inhaler   2     Dispense as written.   . furosemide (LASIX) 40 MG tablet   Oral   Take 40 mg by mouth daily.          Marland Kitchen lisinopril (PRINIVIL,ZESTRIL) 20 MG tablet   Oral   Take 20 mg by mouth daily.          Marland Kitchen LORazepam (ATIVAN) 1 MG tablet   Oral   Take 1 tablet (1 mg total) by mouth every 8 (eight) hours as needed for anxiety.   20 tablet   0   . NEXIUM 40 MG capsule   Oral   Take 40 mg by mouth 2 (two) times daily.            Dispense as written.   . nitroGLYCERIN (NITROSTAT) 0.4 MG SL tablet   Sublingual  Place 0.4 mg under the tongue every 5 (five) minutes as needed.          . Oxycodone HCl 10 MG TABS   Oral   Take 1 tablet (10 mg total) by mouth every 6 (six) hours as needed.   20 tablet   0   . simvastatin (ZOCOR) 20 MG tablet   Oral   Take 20 mg by mouth daily.            Allergies Dexlansoprazole and Ultram  No family history on file.  Social History Social History  Substance Use Topics  . Smoking status: Never Smoker   . Smokeless tobacco: None  . Alcohol Use: No    Review of Systems Constitutional: No fever/chills Eyes: No visual changes. ENT: No sore throat. Cardiovascular: Denies chest pain. Respiratory: As above  Gastrointestinal: No abdominal pain.  No nausea, no vomiting.  No diarrhea.  No constipation. Genitourinary: Negative for dysuria. Musculoskeletal: Negative for back pain. Skin: Negative for rash. Neurological: Negative for headaches, focal weakness or numbness.  10-point ROS otherwise negative.  ____________________________________________   PHYSICAL EXAM:  VITAL SIGNS: ED Triage Vitals  Enc Vitals Group     BP  12/28/14 1343 236/101 mmHg     Pulse Rate 12/28/14 1343 111     Resp 12/28/14 1343 28     Temp 12/28/14 1343 98.6 F (37 C)     Temp Source 12/28/14 1343 Oral     SpO2 12/28/14 1343 97 %     Weight 12/28/14 1343 200 lb (90.719 kg)     Height 12/28/14 1343 5\' 1"  (1.549 m)     Head Cir --      Peak Flow --      Pain Score 12/28/14 1344 10     Pain Loc --      Pain Edu? --      Excl. in [place]? --     Constitutional: Alert and oriented. Well appearing and in no acute distress. Eyes: Conjunctivae are normal. PERRL. EOMI. Head: Atraumatic. Nose: No congestion/rhinnorhea. Mouth/Throat: Mucous membranes are moist.  Oropharynx non-erythematous. Neck: No stridor.   Cardiovascular: Normal rate, regular rhythm. Grossly normal heart sounds.  Good peripheral circulation. Respiratory: Tachypneic with increased respiratory effort without any retractions. Coarse wheezing throughout. Gastrointestinal: Soft and nontender. No distention. No abdominal bruits. No CVA tenderness. Musculoskeletal: No lower extremity tenderness nor edema.  No joint effusions. Neurologic:  Normal speech and language. No gross focal neurologic deficits are appreciated. No gait instability. Skin:  Skin is warm, dry and intact. No rash noted. Psychiatric: Mood and affect are normal. Speech and behavior are normal.  ____________________________________________   LABS (all labs ordered are listed, but only abnormal results are displayed)  Labs Reviewed  CBC WITH DIFFERENTIAL/PLATELET - Abnormal; Notable for the following:    WBC 12.3 (*)    Lymphs Abs 4.8 (*)    All other components within normal limits  COMPREHENSIVE METABOLIC PANEL - Abnormal; Notable for the following:    Glucose, Bld 120 (*)    Total Bilirubin 0.2 (*)    Anion gap 4 (*)    All other components within normal limits  TROPONIN I   ____________________________________________  EKG  ED ECG REPORT I, Doran Stabler, the attending physician,  personally viewed and interpreted this ECG.   Date: 12/28/2014  EKG Time: 1353  Rate: 102  Rhythm: sinus tachycardia  Axis: Normal axis  Intervals:none  ST&T Change: No ST segment elevation or  depression. No abnormal T-wave inversion.  ____________________________________________  RADIOLOGY  No acute pulmonary disease. ____________________________________________   PROCEDURES    ____________________________________________   INITIAL IMPRESSION / ASSESSMENT AND PLAN / ED COURSE  Pertinent labs & imaging results that were available during my care of the patient were reviewed by me and considered in my medical decision making (see chart for details).  ----------------------------------------- 3:10 PM on 12/28/2014 ----------------------------------------- Patient receiving treatment for her COPD at this time. To be followed from this point by Dr. Burlene Arnt.    ____________________________________________   FINAL CLINICAL IMPRESSION(S) / ED DIAGNOSES  Acute COPD exacerbation.    Orbie Pyo, MD 12/28/14 [phone]

## 2014-12-29 LAB — CBC
HEMATOCRIT: 41.9 % (ref 35.0–47.0)
HEMOGLOBIN: 13.8 g/dL (ref 12.0–16.0)
MCH: 31.4 pg (ref 26.0–34.0)
MCHC: 32.9 g/dL (ref 32.0–36.0)
MCV: 95.4 fL (ref 80.0–100.0)
Platelets: 216 10*3/uL (ref 150–440)
RBC: 4.4 MIL/uL (ref 3.80–5.20)
RDW: 13.9 % (ref 11.5–14.5)
WBC: 13 10*3/uL — AB (ref 3.6–11.0)

## 2014-12-29 LAB — BASIC METABOLIC PANEL
Anion gap: 6 (ref 5–15)
BUN: 11 mg/dL (ref 6–20)
CO2: 28 mmol/L (ref 22–32)
Calcium: 9.2 mg/dL (ref 8.9–10.3)
Chloride: 107 mmol/L (ref 101–111)
Creatinine, Ser: 0.62 mg/dL (ref 0.44–1.00)
GFR calc Af Amer: >60 mL/min (ref >60)
GLUCOSE: 140 mg/dL — AB (ref 65–99)
POTASSIUM: 4.3 mmol/L (ref 3.5–5.1)
Sodium: 141 mmol/L (ref 135–145)

## 2014-12-29 NOTE — Progress Notes (Signed)
[place] at [place] NAME: Dawn Donaldson    MR#:  [id number]  DATE OF BIRTH:  06-28-1950  SUBJECTIVE:  CHIEF COMPLAINT:   Chief Complaint  Patient presents with  . Shortness of Breath   Still feels very short of breath, wheezing, increased work of breathing even at rest  REVIEW OF SYSTEMS:   Review of Systems  Constitutional: Negative for fever.  Respiratory: Positive for cough, sputum production, shortness of breath and wheezing.   Cardiovascular: Negative for chest pain and palpitations.  Gastrointestinal: Negative for nausea, vomiting and abdominal pain.  Genitourinary: Negative for dysuria.    DRUG ALLERGIES:   Allergies  Allergen Reactions  . Dexlansoprazole Nausea And Vomiting  . Ultram [Tramadol] Itching    VITALS:  Blood pressure 136/67, pulse 87, temperature 97.7 F (36.5 C), temperature source Oral, resp. rate 20, height 5\' 1"  (1.549 m), weight 93.123 kg (205 lb 4.8 oz), SpO2 98 %.  PHYSICAL EXAMINATION:  GENERAL:  64 y.o.-year-old patient lying in the bed with no acute distress. Breathing quickly LUNGS: Wheezing, fair air movement, increased work of breathing  CARDIOVASCULAR: S1, S2 normal. No murmurs, rubs, or gallops.  ABDOMEN: Soft, nontender, nondistended. Bowel sounds present. No organomegaly or mass.  EXTREMITIES: No pedal edema, cyanosis, or clubbing.  NEUROLOGIC: Cranial nerves II through XII are intact. Muscle strength 5/5 in all extremities. Sensation intact. Gait not checked.  PSYCHIATRIC: The patient is alert and oriented x 3.  SKIN: No obvious rash, lesion, or ulcer.    LABORATORY PANEL:   CBC  Recent Labs Lab 12/29/14 0501  WBC 13.0*  HGB 13.8  HCT 41.9  PLT 216   ------------------------------------------------------------------------------------------------------------------  Chemistries   Recent Labs Lab 12/28/14 1407 12/29/14 0501  NA 140 141  K 3.6 4.3  CL 107 107  CO2 29  28  GLUCOSE 120* 140*  BUN 10 11  CREATININE 0.71 0.62  CALCIUM 9.0 9.2  AST 18  --   ALT 14  --   ALKPHOS 93  --   BILITOT 0.2*  --    ------------------------------------------------------------------------------------------------------------------  Cardiac Enzymes  Recent Labs Lab 12/28/14 1407  TROPONINI <0.03   ------------------------------------------------------------------------------------------------------------------  RADIOLOGY:  Dg Chest 1 View  12/28/2014  CLINICAL DATA:  C/o worsening sob and wheezing for the past 3-4 days, hx of COPD, htn, cad, and chf; on home o2 EXAM: CHEST  1 VIEW COMPARISON:  11/13/2014 FINDINGS: Lungs are clear, hyperinflated. Heart size and mediastinal contours are within normal limits. No effusion.  No pneumothorax. Visualized skeletal structures are unremarkable. IMPRESSION: No acute cardiopulmonary disease. Electronically Signed   By: Lucrezia Europe M.D.   On: 12/28/2014 15:12    EKG:   Orders placed or performed during the hospital encounter of 12/28/14  . EKG 12-Lead  . EKG 12-Lead    ASSESSMENT AND PLAN:   1. COPD exacerbation - She has chronic respiratory failure and is on 2 L at home, still at baseline oxygen requirement - Increased work of breathing with significant wheezing even at rest - Continue Zithromax, DuoNeb's, Solu-Medrol  2. Hypertension - Controlled. Continue Lasix and lisinopril  3. OSA - Continue CPAP  4. Chronic diastolic heart failure - No exacerbation. Continue Lasix, lisinopril, aspirin and statin - Low sodium diet   All the records are reviewed and case discussed with Care Management/Social Workerr. Management plans discussed with the patient, family and they are in agreement. She is still very short of breath even  at rest. Will continue aggressive treatment of the COPD exacerbation at least overnight.  CODE STATUS: Full   TOTAL TIME TAKING CARE OF THIS PATIENT: 25 minutes.  Greater than 50% of  time spent in care coordination and counseling. POSSIBLE D/C IN 1 DAYS, DEPENDING ON CLINICAL CONDITION.   Myrtis Ser M.D on 12/29/2014 at 1:58 PM  Between 7am to 6pm - Pager - [phone]  After 6pm go to [email or web address] - password [email or web address] [facility]  Office  [phone]  CC: Primary care physician; Lorelee Market, MD

## 2014-12-29 NOTE — Progress Notes (Signed)
Pt placed CPAP with 2L O2 bled inline.

## 2014-12-30 ENCOUNTER — Inpatient Hospital Stay: Payer: Medicare Other

## 2014-12-30 MED ORDER — IPRATROPIUM-ALBUTEROL 20-100 MCG/ACT IN AERS: 1.0000 | INHALATION_SPRAY | Freq: Four times a day (QID) | RESPIRATORY_TRACT | Status: DC

## 2014-12-30 MED ORDER — ALBUTEROL SULFATE HFA 108 (90 BASE) MCG/ACT IN AERS: 2.0000 | INHALATION_SPRAY | RESPIRATORY_TRACT | Status: DC | PRN

## 2014-12-30 NOTE — Care Management Important Message (Signed)
Important Message  Patient Details  Name: Dawn Donaldson MRN: [id number] Date of Birth: Apr 11, 1950   Medicare Important Message Given:  Yes    Ezekiel Menzer A, RN 12/30/2014, 10:03 AM

## 2014-12-30 NOTE — Progress Notes (Signed)
Patient states breathing is the worst when she has to ambulate to bathroom. Prn svn given due to difficulty breathing from ambulation. Tolerated well. o2 resumed at [employer]

## 2014-12-30 NOTE — Progress Notes (Signed)
[place] at [place] NAME: Dawn Donaldson    MR#:  [id number]  DATE OF BIRTH:  October 29, 1950  SUBJECTIVE:  CHIEF COMPLAINT:   Chief Complaint  Patient presents with  . Shortness of Breath   Had an episode of respiratory distress this morning while brushing her teeth, still with increased work of breathing  REVIEW OF SYSTEMS:   Review of Systems  Constitutional: Negative for fever.  Respiratory: Positive for cough, sputum production, shortness of breath and wheezing.   Cardiovascular: Negative for chest pain and palpitations.  Gastrointestinal: Negative for nausea, vomiting and abdominal pain.  Genitourinary: Negative for dysuria.    DRUG ALLERGIES:   Allergies  Allergen Reactions  . Dexlansoprazole Nausea And Vomiting  . Ultram [Tramadol] Itching    VITALS:  Blood pressure 129/75, pulse 75, temperature 98.3 F (36.8 C), temperature source Oral, resp. rate 17, height 5\' 1"  (1.549 m), weight 93.123 kg (205 lb 4.8 oz), SpO2 100 %.  PHYSICAL EXAMINATION:  GENERAL:  64 y.o.-year-old patient lying in the bed with no acute distress. Breathing quickly, lab wheezes LUNGS: Wheezing, fair air movement, increased work of breathing  CARDIOVASCULAR: S1, S2 normal. No murmurs, rubs, or gallops.  ABDOMEN: Soft, nontender, nondistended. Bowel sounds present. No organomegaly or mass.  EXTREMITIES: No pedal edema, cyanosis, or clubbing.  NEUROLOGIC: Cranial nerves II through XII are intact. Muscle strength 5/5 in all extremities. Sensation intact. Gait not checked.  PSYCHIATRIC: The patient is alert and oriented x 3.  SKIN: No obvious rash, lesion, or ulcer.    LABORATORY PANEL:   CBC  Recent Labs Lab 12/29/14 0501  WBC 13.0*  HGB 13.8  HCT 41.9  PLT 216   ------------------------------------------------------------------------------------------------------------------  Chemistries   Recent Labs Lab 12/28/14 1407 12/29/14 0501   NA 140 141  K 3.6 4.3  CL 107 107  CO2 29 28  GLUCOSE 120* 140*  BUN 10 11  CREATININE 0.71 0.62  CALCIUM 9.0 9.2  AST 18  --   ALT 14  --   ALKPHOS 93  --   BILITOT 0.2*  --    ------------------------------------------------------------------------------------------------------------------  Cardiac Enzymes  Recent Labs Lab 12/28/14 1407  TROPONINI <0.03   ------------------------------------------------------------------------------------------------------------------  RADIOLOGY:  Dg Chest 1 View  12/28/2014  CLINICAL DATA:  C/o worsening sob and wheezing for the past 3-4 days, hx of COPD, htn, cad, and chf; on home o2 EXAM: CHEST  1 VIEW COMPARISON:  11/13/2014 FINDINGS: Lungs are clear, hyperinflated. Heart size and mediastinal contours are within normal limits. No effusion.  No pneumothorax. Visualized skeletal structures are unremarkable. IMPRESSION: No acute cardiopulmonary disease. Electronically Signed   By: Lucrezia Europe M.D.   On: 12/28/2014 15:12   Dg Chest 2 View  12/30/2014  CLINICAL DATA:  Hypoxia with wheezing and severe shortness breath this morning. EXAM: CHEST  2 VIEW COMPARISON:  Radiographs 12/28/2014 and 11/23/2014. FINDINGS: The heart size and mediastinal contours are stable. The lungs remain mildly hyperinflated but clear. There is no pleural effusion or pneumothorax. No acute osseous findings are demonstrated. IMPRESSION: Stable chest with findings consistent with chronic obstructive pulmonary disease. No acute findings. Electronically Signed   By: Richardean Sale M.D.   On: 12/30/2014 08:58    EKG:   Orders placed or performed during the hospital encounter of 12/28/14  . EKG 12-Lead  . EKG 12-Lead    ASSESSMENT AND PLAN:   1. COPD exacerbation -  chronic respiratory failure 2 L  at home, still at baseline oxygen requirement - Increased work of breathing with significant wheezing even at rest - Continue Zithromax, DuoNeb's, Solu-Medrol  2.  Hypertension - Controlled. Continue Lasix and lisinopril  3. OSA - Continue CPAP with oxygen  4. Chronic diastolic heart failure - No exacerbation, no edema on chest x-ray. Continue Lasix, lisinopril, aspirin and statin - Low sodium diet   All the records are reviewed and case discussed with Care Management/Social Workerr. Management plans discussed with the patient, family and they are in agreement. pdated the patient and her husband at the bedside. They are in agreement with this plan of care.  CODE STATUS: Full   TOTAL TIME TAKING CARE OF THIS PATIENT: 25 minutes.  Greater than 50% of time spent in care coordination and counseling. POSSIBLE D/C IN 1 DAYS, DEPENDING ON CLINICAL CONDITION.   Myrtis Ser M.D on 12/30/2014 at 11:39 AM  Between 7am to 6pm - Pager - [phone]  After 6pm go to [email or web address] - password [email or web address] [facility]  Office  [phone]  CC: Primary care physician; Lorelee Market, MD

## 2014-12-30 NOTE — Progress Notes (Signed)
Around 0300, patient presented with shortness of breath and expiratory wheezes were more audible after ambulating to the bathroom.  I put her up to 3L O2 and her saturation was 93%.  It went up to 95% after a few minutes.  Her breathing was very labored so I asked respiratory therapy to give her a nebulizer treatment.  After the treatment, patient was able to sleep.  Christene Slates  12/30/2014  6:09 AM

## 2014-12-31 LAB — BASIC METABOLIC PANEL
Anion gap: 8 (ref 5–15)
BUN: 23 mg/dL — ABNORMAL HIGH (ref 6–20)
CHLORIDE: 101 mmol/L (ref 101–111)
CO2: 33 mmol/L — AB (ref 22–32)
Calcium: 9.2 mg/dL (ref 8.9–10.3)
Creatinine, Ser: 0.74 mg/dL (ref 0.44–1.00)
GFR calc Af Amer: >60 mL/min (ref >60)
Glucose, Bld: 142 mg/dL — ABNORMAL HIGH (ref 65–99)
Potassium: 4.3 mmol/L (ref 3.5–5.1)
Sodium: 142 mmol/L (ref 135–145)

## 2014-12-31 LAB — CBC
HEMATOCRIT: 40 % (ref 35.0–47.0)
HEMOGLOBIN: 12.8 g/dL (ref 12.0–16.0)
MCH: 30.2 pg (ref 26.0–34.0)
MCHC: 31.9 g/dL — AB (ref 32.0–36.0)
MCV: 94.6 fL (ref 80.0–100.0)
Platelets: 226 10*3/uL (ref 150–440)
RBC: 4.23 MIL/uL (ref 3.80–5.20)
RDW: 14 % (ref 11.5–14.5)
WBC: 16.6 10*3/uL — ABNORMAL HIGH (ref 3.6–11.0)

## 2014-12-31 MED ORDER — MOMETASONE FURO-FORMOTEROL FUM 100-5 MCG/ACT IN AERO
2.0000 | INHALATION_SPRAY | Freq: Two times a day (BID) | RESPIRATORY_TRACT | Status: DC
  Administered 2014-12-31 – 2015-01-03 (×6): 2 via RESPIRATORY_TRACT
  Filled 2014-12-31: qty 8.8

## 2014-12-31 MED ORDER — METHYLPREDNISOLONE SODIUM SUCC 125 MG IJ SOLR
60.0000 mg | Freq: Four times a day (QID) | INTRAMUSCULAR | Status: DC
  Administered 2014-12-31 – 2015-01-01 (×3): 60 mg via INTRAVENOUS
  Filled 2014-12-31 (×3): qty 2

## 2014-12-31 MED ORDER — AZITHROMYCIN 250 MG PO TABS
500.0000 mg | ORAL_TABLET | Freq: Every day | ORAL | Status: AC
  Administered 2015-01-01: 500 mg via ORAL
  Filled 2014-12-31: qty 2

## 2014-12-31 NOTE — Progress Notes (Signed)
[place] at [place] NAME: Dawn Donaldson    MR#:  [id number]  DATE OF BIRTH:  [birth year]  SUBJECTIVE:  CHIEF COMPLAINT:   Chief Complaint  Patient presents with  . Shortness of Breath   Patient is not clinically improving, still diffusely wheezing and feeling tight in her chest   REVIEW OF SYSTEMS:   Review of Systems  Constitutional: Negative for fever.  Respiratory: Positive for cough, sputum production, shortness of breath and wheezing.   Cardiovascular: Negative for chest pain and palpitations.  Gastrointestinal: Negative for nausea, vomiting and abdominal pain.  Genitourinary: Negative for dysuria.    DRUG ALLERGIES:   Allergies  Allergen Reactions  . Dexlansoprazole Nausea And Vomiting  . Ultram [Tramadol] Itching    VITALS:  Blood pressure 142/62, pulse 85, temperature 97.9 F (36.6 C), temperature source Oral, resp. rate 22, height 5\' 1"  (1.549 m), weight 93.123 kg (205 lb 4.8 oz), SpO2 96 %.  PHYSICAL EXAMINATION:  GENERAL:  64 y.o.-year-old patient lying in the bed with no acute distress. Breathing quickly, lab wheezes LUNGS: Diffusely Wheezing, fair air movement, no accessory muscle use CARDIOVASCULAR: S1, S2 normal. No murmurs, rubs, or gallops.  ABDOMEN: Soft, nontender, nondistended. Bowel sounds present. No organomegaly or mass.  EXTREMITIES: No pedal edema, cyanosis, or clubbing.  NEUROLOGIC: Cranial nerves II through XII are intact. Muscle strength 5/5 in all extremities. Sensation intact. Gait not checked.  PSYCHIATRIC: The patient is alert and oriented x 3.  SKIN: No obvious rash, lesion, or ulcer.    LABORATORY PANEL:   CBC  Recent Labs Lab 12/31/14 0452  WBC 16.6*  HGB 12.8  HCT 40.0  PLT 226   ------------------------------------------------------------------------------------------------------------------  Chemistries   Recent Labs Lab 12/28/14 1407  12/31/14 0452  NA 140  < > 142   K 3.6  < > 4.3  CL 107  < > 101  CO2 29  < > 33*  GLUCOSE 120*  < > 142*  BUN 10  < > 23*  CREATININE 0.71  < > 0.74  CALCIUM 9.0  < > 9.2  AST 18  --   --   ALT 14  --   --   ALKPHOS 93  --   --   BILITOT 0.2*  --   --   < > = values in this interval not displayed. ------------------------------------------------------------------------------------------------------------------  Cardiac Enzymes  Recent Labs Lab 12/28/14 1407  TROPONINI <0.03   ------------------------------------------------------------------------------------------------------------------  RADIOLOGY:  Dg Chest 2 View  12/30/2014  CLINICAL DATA:  Hypoxia with wheezing and severe shortness breath this morning. EXAM: CHEST  2 VIEW COMPARISON:  Radiographs 12/28/2014 and 11/23/2014. FINDINGS: The heart size and mediastinal contours are stable. The lungs remain mildly hyperinflated but clear. There is no pleural effusion or pneumothorax. No acute osseous findings are demonstrated. IMPRESSION: Stable chest with findings consistent with chronic obstructive pulmonary disease. No acute findings. Electronically Signed   By: Richardean Sale M.D.   On: 12/30/2014 08:58    EKG:   Orders placed or performed during the hospital encounter of 12/28/14  . EKG 12-Lead  . EKG 12-Lead    ASSESSMENT AND PLAN:   1. COPD exacerbation-very slow clinical progress -  chronic respiratory failure 2 L at home, still at baseline oxygen requirement - Still with significant wheezing even at rest - Continue Zithromax, DuoNeb's, Solu-Medrol dose increased to 60 mg IV every 6 hours -Advair is added to the regimen -Repeat chest x-ray  with COPD but no infiltrates  2. Hypertension - Controlled. Continue Lasix and lisinopril  3. OSA - Continue CPAP with oxygen  4. Chronic diastolic heart failure - No exacerbation, no edema on chest x-ray. Continue Lasix, lisinopril, aspirin and statin - Low sodium diet   All the records are  reviewed and case discussed with Care Management/Social Workerr. Management plans discussed with the patient, family and they are in agreement. pdated the patient and her husband at the bedside. They are in agreement with this plan of care.  CODE STATUS: Full   TOTAL TIME TAKING CARE OF THIS PATIENT: 33 minutes.  Greater than 50% of time spent in care coordination and counseling. POSSIBLE D/C IN 1-2 DAYS, DEPENDING ON CLINICAL CONDITION.   Nicholes Mango M.D on 12/31/2014 at 3:53 PM  Between 7am to 6pm - Pager - [phone]  After 6pm go to [email or web address] - password [email or web address] [facility]  Office  [phone]  CC: Primary care physician; Lorelee Market, MD

## 2014-12-31 NOTE — Progress Notes (Signed)
A/O. VSS.  Oxygen weaned down to 2 L which is patients baseline.  Oxycodone given once for back pain.  Tolerating her diet well.  Voiding adequately.  Patient ambulated a lap in the hallway with oxygen and did get a little short of breath but did recover after resting.  No significant changes.

## 2014-12-31 NOTE — Progress Notes (Signed)
Pharmacy Antibiotic Follow-up Note  Dawn Donaldson is a 64 y.o. year-old female admitted on 12/28/2014.  The patient is currently on day 4 of azithromycin 500 mg IV q24 for COPD exacerbation.  Assessment/Plan: After discussion with Dr. Margaretmary Eddy, the length of therapy for azithromycin for COPD exacerbation is 5 days. The stop date is entered and will be 01/01/15. Azithromycin also changed to PO.   Temp (24hrs), Avg:98.1 F (36.7 C), Min:97.9 F (36.6 C), Max:98.2 F (36.8 C)   Recent Labs Lab 12/28/14 1407 12/29/14 0501 12/31/14 0452  WBC 12.3* 13.0* 16.6*    Recent Labs Lab 12/28/14 1407 12/29/14 0501 12/31/14 0452  CREATININE 0.71 0.62 0.74   Estimated Creatinine Clearance: 75.9 mL/min (by C-G formula based on Cr of 0.74).    Allergies  Allergen Reactions  . Dexlansoprazole Nausea And Vomiting  . Ultram [Tramadol] Itching    Antimicrobials this admission: Azithromycin 12/24>>    Microbiology results: none  Thank you for allowing pharmacy to be a part of this patient's care.  Rocky Morel PharmD 12/31/2014 3:30 PM

## 2015-01-01 ENCOUNTER — Inpatient Hospital Stay: Payer: Medicare Other

## 2015-01-01 MED ORDER — METHYLPREDNISOLONE SODIUM SUCC 125 MG IJ SOLR
80.0000 mg | Freq: Four times a day (QID) | INTRAMUSCULAR | Status: DC
  Administered 2015-01-01 – 2015-01-02 (×5): 80 mg via INTRAVENOUS
  Filled 2015-01-01 (×5): qty 2

## 2015-01-01 MED ORDER — SENNA 8.6 MG PO TABS
1.0000 | ORAL_TABLET | Freq: Two times a day (BID) | ORAL | Status: DC
  Administered 2015-01-01 – 2015-01-03 (×5): 8.6 mg via ORAL
  Filled 2015-01-01 (×5): qty 1

## 2015-01-01 MED ORDER — BISACODYL 10 MG RE SUPP
10.0000 mg | Freq: Every day | RECTAL | Status: DC
  Filled 2015-01-01 (×3): qty 1

## 2015-01-01 NOTE — Care Management (Signed)
Spoke with patient for discharge planning. Patient is alert and oriented from home with "someone that helps her" Stated that she still drives occasionally "not much, but I have someone that can drive me".   Denies any issues with medications.  Has Home O2 through [facility]. No other DME. No CM needs identified.

## 2015-01-01 NOTE — Care Management Important Message (Signed)
Important Message  Patient Details  Name: Dawn Donaldson MRN: [id number] Date of Birth: [birth year]   Medicare Important Message Given:  Yes    Juliann Pulse A Hayden Kihara 01/01/2015, 9:57 AM

## 2015-01-01 NOTE — Progress Notes (Signed)
[place] at [place] NAME: Dawn Donaldson    MR#:  [id number]  DATE OF BIRTH:  01-Jul-1950  SUBJECTIVE:  CHIEF COMPLAINT:   Chief Complaint  Patient presents with  . Shortness of Breath   Patient is  still diffusely wheezing and feeling tight in her chest   REVIEW OF SYSTEMS:   Review of Systems  Constitutional: Negative for fever, chills and diaphoresis.  HENT: Negative for ear discharge, ear pain and tinnitus.   Eyes: Negative for blurred vision, photophobia and pain.  Respiratory: Positive for cough, sputum production, shortness of breath and wheezing.   Cardiovascular: Negative for chest pain and palpitations.  Gastrointestinal: Negative for nausea, vomiting and abdominal pain.  Genitourinary: Negative for dysuria.  Neurological: Positive for sensory change. Negative for dizziness, tingling, tremors and headaches.  Psychiatric/Behavioral: The patient is not nervous/anxious and does not have insomnia.     DRUG ALLERGIES:   Allergies  Allergen Reactions  . Dexlansoprazole Nausea And Vomiting  . Ultram [Tramadol] Itching    VITALS:  Blood pressure 135/69, pulse 79, temperature 97.5 F (36.4 C), temperature source Oral, resp. rate 22, height 5\' 1"  (1.549 m), weight 93.123 kg (205 lb 4.8 oz), SpO2 96 %.  PHYSICAL EXAMINATION:  GENERAL:  64 y.o.-year-old patient lying in the bed with no acute distress. Breathing quickly, lab wheezes LUNGS: Diffusely Wheezing, fair air movement, no accessory muscle use CARDIOVASCULAR: S1, S2 normal. No murmurs, rubs, or gallops.  ABDOMEN: Soft, nontender, nondistended. Bowel sounds present. No organomegaly or mass.  EXTREMITIES: No pedal edema, cyanosis, or clubbing.  NEUROLOGIC: Cranial nerves II through XII are intact. Muscle strength 5/5 in all extremities. Sensation intact. Gait not checked.  PSYCHIATRIC: The patient is alert and oriented x 3.  SKIN: No obvious rash, lesion, or ulcer.     LABORATORY PANEL:   CBC  Recent Labs Lab 12/31/14 0452  WBC 16.6*  HGB 12.8  HCT 40.0  PLT 226   ------------------------------------------------------------------------------------------------------------------  Chemistries   Recent Labs Lab 12/28/14 1407  12/31/14 0452  NA 140  < > 142  K 3.6  < > 4.3  CL 107  < > 101  CO2 29  < > 33*  GLUCOSE 120*  < > 142*  BUN 10  < > 23*  CREATININE 0.71  < > 0.74  CALCIUM 9.0  < > 9.2  AST 18  --   --   ALT 14  --   --   ALKPHOS 93  --   --   BILITOT 0.2*  --   --   < > = values in this interval not displayed. ------------------------------------------------------------------------------------------------------------------  Cardiac Enzymes  Recent Labs Lab 12/28/14 1407  TROPONINI <0.03   ------------------------------------------------------------------------------------------------------------------  RADIOLOGY:  Dg Chest 2 View  01/01/2015  CLINICAL DATA:  COPD.  Home oxygen.  Worsening shortness of breath. EXAM: CHEST  2 VIEW COMPARISON:  None. FINDINGS: Heart and mediastinal contours are within normal limits. No focal opacities or effusions. No acute bony abnormality. IMPRESSION: No active cardiopulmonary disease. Electronically Signed   By: Rolm Baptise M.D.   On: 01/01/2015 11:46    EKG:   Orders placed or performed during the hospital encounter of 12/28/14  . EKG 12-Lead  . EKG 12-Lead    ASSESSMENT AND PLAN:   1. COPD exacerbation-very slow clinical progress -  chronic respiratory failure 2 L at home, still at baseline oxygen requirement - Still with significant wheezing even at  rest - Continue Zithromax, DuoNeb's, Solu-Medrol dose increased to 80 mg IV every 6 hours -Advair is added to the regimen -Repeat chest x-ray with COPD but no infiltrates -We will consider pulmonology consult if no clinical improvement by tomorrow -Incentive spirometry  2. Hypertension - Controlled. Continue Lasix and  lisinopril  3. OSA - Continue CPAP with oxygen  4. Chronic diastolic heart failure - No exacerbation, no edema on chest x-ray. Continue Lasix, lisinopril, aspirin and statin - Low sodium diet  5. Constipation Provided laxatives   All the records are reviewed and case discussed with Care Management/Social Workerr. Management plans discussed with the patient, family and they are in agreement. pdated the patient and her husband at the bedside. They are in agreement with this plan of care.  CODE STATUS: Full   TOTAL TIME TAKING CARE OF THIS PATIENT: 35 minutes.  Greater than 50% of time spent in care coordination and counseling. POSSIBLE D/C IN 1-2 DAYS, DEPENDING ON CLINICAL CONDITION.   Nicholes Mango M.D on 01/01/2015 at 3:22 PM  Between 7am to 6pm - Pager - [phone]  After 6pm go to [email or web address] - password [email or web address] [facility]  Office  [phone]  CC: Primary care physician; Lorelee Market, MD

## 2015-01-02 MED ORDER — METHYLPREDNISOLONE SODIUM SUCC 40 MG IJ SOLR
40.0000 mg | Freq: Two times a day (BID) | INTRAMUSCULAR | Status: DC
  Administered 2015-01-03: 40 mg via INTRAVENOUS
  Filled 2015-01-02: qty 1

## 2015-01-02 NOTE — Evaluation (Signed)
Physical Therapy Evaluation Patient Details Name: Dawn Donaldson MRN: [id number] DOB: 08/28/1950 Today's Date: 01/02/2015   History of Present Illness  Pt is a 64yo white female with history of lung disease, who came to ED on 12/24 after 3-4 days of worsening SOB and cough. Pt is on 2L O2 at home (for several years), and report Hx of multiple bulging discs as well as a failed low back surgery.   Clinical Impression  Pt is received semirecumbent in bed upon entry, asleep, but easily awakened, and willing to participate. No acute distress noted. Pt is A&Ox3 and pleasant. Pt reports zero falls in the last 6 months. Functional strength is profoundly impaired as evidenced by 30 second chair rise of 4, whereas age matched norm is 40-17. Pt denies falls history, however pt falls risk is high as evidenced by slow gait speed variability, poor forward reach (5 inches), and elevated postural sway in quiet stance. Pt remaining on 3L O2 throughout evaluation, but desaturating with activity (92%) as whereas pt is on 2LO2 at baseline at home, however more concerning is HR: 119 with slow amb.Patient presenting with impairment of strength, balance, oxygen perfusion, and activity tolerance, limiting ability to perform ADL and mobility tasks at  baseline level of function, however pt is near baseline for the above impairments. Pt has been mobile while admitted and walking with nursing. Pt denies interest in improving these deficits, and is not interested in skilled services from PT while admitted or after DC. Recommending use of SPC for stability in home and community AMB and patient is agreeable. PT signing off.     Follow Up Recommendations No PT follow up    Equipment Recommendations  Cane    Recommendations for Other Services       Precautions / Restrictions Precautions Precautions: None Precaution Comments: Falls scor eof 7 at eval.  Restrictions Weight Bearing Restrictions: No      Mobility  Bed  Mobility Overal bed mobility: Modified Independent                Transfers Overall transfer level: Modified independent Equipment used: None             General transfer comment: uses hands to perform 5x STS in 35s, which she reports as baseline.   Ambulation/Gait Ambulation/Gait assistance: Modified independent (Device/Increase time) Ambulation Distance (Feet): 190 Feet Assistive device:  (intermittent railing use in hallway. )   Gait velocity: 0.16m/s forward  Gait velocity interpretation: <1.8 ft/sec, indicative of risk for recurrent falls General Gait Details: unilateral hip instability withoccasional scissoring, poor speed variability between self-selected and maximal gait speeds.   Stairs            Wheelchair Mobility    Modified Rankin (Stroke Patients Only)       Balance Overall balance assessment:  (High postural sway c sharpened Rhomberg; forward reach estimated at 5 inches. No LOB. Denies falls history. )                                           Pertinent Vitals/Pain Pain Assessment: 0-10 Pain Score: 6  Pain Location: back pain (reports a chronic problem current pain level is typical)  Pain Intervention(s): Monitored during session    Home Living Family/patient expects to be discharged to:: Private residence Living Arrangements: Spouse/significant other Available Help at Discharge: Family Type of Home: Mobile  home Home Access: Stairs to enter Entrance Stairs-Rails: Can reach both Entrance Stairs-Number of Steps: 2 Home Layout: One level Home Equipment: None      Prior Function Level of Independence: Needs assistance   Gait / Transfers Assistance Needed: holds onto furniture at home for AMB; using shopping cart for support when out.   ADL's / Homemaking Assistance Needed: Mod I; additional time, occasionally unable to due to worsening DOE.         Hand Dominance        Extremity/Trunk Assessment   Upper  Extremity Assessment: Generalized weakness           Lower Extremity Assessment: Generalized weakness      Cervical / Trunk Assessment: Normal  Communication   Communication: No difficulties  Cognition Arousal/Alertness: Suspect due to medications;Lethargic (Recently given an Ativan for anxiety per RN. ) Behavior During Therapy: WFL for tasks assessed/performed;Flat affect Overall Cognitive Status: Within Functional Limits for tasks assessed       Memory: Decreased recall of precautions              General Comments      Exercises        Assessment/Plan    PT Assessment Patent does not need any further PT services  PT Diagnosis Difficulty walking;Abnormality of gait   PT Problem List Decreased strength;Decreased knowledge of use of DME;Decreased activity tolerance;Decreased mobility;Obesity;Cardiopulmonary status limiting activity;Pain  PT Treatment Interventions  (Encouraged patient to continue to remain ambulatory while admitted; encouraged RN to keep patient active. )   PT Goals (Current goals can be found in the Care Plan section) Acute Rehab PT Goals PT Goal Formulation: All assessment and education complete, DC therapy    Frequency     Barriers to discharge        Co-evaluation               End of Session Equipment Utilized During Treatment: Gait belt Activity Tolerance: Patient tolerated treatment well;Patient limited by lethargy;Treatment limited secondary to medical complications (Comment) Patient left: in bed;with call bell/phone within reach Nurse Communication: Mobility status;Other (comment) (tachy)         TimeMD:[id number] PT Time Calculation (min) (ACUTE ONLY): 8 min   Charges:   PT Evaluation $Initial PT Evaluation Tier I: 1 Procedure     PT G Codes:        Aeric Burnham C Jan 30, 2015, 2:21 PM  2:28 PM  Etta Grandchild, PT, DPT [place] License # [id number]

## 2015-01-02 NOTE — Progress Notes (Signed)
[facility] [place] NAME: Dawn Donaldson    MR#:  [id number]  DATE OF BIRTH:  [birth year]  SUBJECTIVE:  CHIEF COMPLAINT:   Chief Complaint  Patient presents with  . Shortness of Breath   Patient is feeling better today, took shower, husband stating that she is almost at her base line   REVIEW OF SYSTEMS:   Review of Systems  Constitutional: Negative for fever, chills and diaphoresis.  HENT: Negative for ear discharge, ear pain and tinnitus.   Eyes: Negative for blurred vision, photophobia and pain.  Respiratory: Positive for cough, sputum production and wheezing. Negative for shortness of breath.   Cardiovascular: Negative for chest pain and palpitations.  Gastrointestinal: Negative for nausea, vomiting and abdominal pain.  Genitourinary: Negative for dysuria.  Neurological: Positive for sensory change. Negative for dizziness, tingling, tremors and headaches.  Psychiatric/Behavioral: The patient is not nervous/anxious and does not have insomnia.     DRUG ALLERGIES:   Allergies  Allergen Reactions  . Dexlansoprazole Nausea And Vomiting  . Ultram [Tramadol] Itching    VITALS:  Blood pressure 137/71, pulse 119, temperature 98 F (36.7 C), temperature source Oral, resp. rate 16, height 5\' 1"  (1.549 m), weight 93.123 kg (205 lb 4.8 oz), SpO2 95 %.  PHYSICAL EXAMINATION:  GENERAL:  64 y.o.-year-old patient lying in the bed with no acute distress. Breathing quickly, lab wheezes LUNGS: MinWheezing, fair air movement, no accessory muscle use CARDIOVASCULAR: S1, S2 normal. No murmurs, rubs, or gallops.  ABDOMEN: Soft, nontender, nondistended. Bowel sounds present. No organomegaly or mass.  EXTREMITIES: No pedal edema, cyanosis, or clubbing.  NEUROLOGIC: Cranial nerves II through XII are intact. Muscle strength 5/5 in all extremities. Sensation intact. Gait not checked.  PSYCHIATRIC: The patient is alert and oriented x 3.  SKIN: No  obvious rash, lesion, or ulcer.    LABORATORY PANEL:   CBC  Recent Labs Lab 12/31/14 0452  WBC 16.6*  HGB 12.8  HCT 40.0  PLT 226   ------------------------------------------------------------------------------------------------------------------  Chemistries   Recent Labs Lab 12/28/14 1407  12/31/14 0452  NA 140  < > 142  K 3.6  < > 4.3  CL 107  < > 101  CO2 29  < > 33*  GLUCOSE 120*  < > 142*  BUN 10  < > 23*  CREATININE 0.71  < > 0.74  CALCIUM 9.0  < > 9.2  AST 18  --   --   ALT 14  --   --   ALKPHOS 93  --   --   BILITOT 0.2*  --   --   < > = values in this interval not displayed. ------------------------------------------------------------------------------------------------------------------  Cardiac Enzymes  Recent Labs Lab 12/28/14 1407  TROPONINI <0.03   ------------------------------------------------------------------------------------------------------------------  RADIOLOGY:  Dg Chest 2 View  01/01/2015  CLINICAL DATA:  COPD.  Home oxygen.  Worsening shortness of breath. EXAM: CHEST  2 VIEW COMPARISON:  None. FINDINGS: Heart and mediastinal contours are within normal limits. No focal opacities or effusions. No acute bony abnormality. IMPRESSION: No active cardiopulmonary disease. Electronically Signed   By: Rolm Baptise M.D.   On: 01/01/2015 11:46    EKG:   Orders placed or performed during the hospital encounter of 12/28/14  . EKG 12-Lead  . EKG 12-Lead    ASSESSMENT AND PLAN:   1. COPD exacerbation-very slow clinical progress -  chronic respiratory failure 2 L at home, still at baseline oxygen requirement -  Still with wheezing, prob her baselinec - Continue Zithromax, DuoNeb's, Solu-Medrol taper -Advair is added to the regimen -Repeat chest x-ray with COPD but no infiltrates - pulmonology consult if no clinical improvement by tomorrow -Incentive spirometry  2. Hypertension - Controlled. Continue Lasix and lisinopril  3. OSA -  Continue CPAP with oxygen  4. Chronic diastolic heart failure - No exacerbation, no edema on chest x-ray. Continue Lasix, lisinopril, aspirin and statin - Low sodium diet  5. Constipation Provided laxatives   All the records are reviewed and case discussed with Care Management/Social Workerr. Management plans discussed with the patient, family and they are in agreement. pdated the patient and her husband at the bedside. They are in agreement with this plan of care.  CODE STATUS: Full   TOTAL TIME TAKING CARE OF THIS PATIENT: 35 minutes.  Greater than 50% of time spent in care coordination and counseling. POSSIBLE D/C IN 1-2 DAYS, DEPENDING ON CLINICAL CONDITION.   Nicholes Mango M.D on 01/02/2015 at 7:59 PM  Between 7am to 6pm - Pager - [phone]  After 6pm go to [email or web address] - password [email or web address] [facility]  Office  [phone]  CC: Primary care physician; Lorelee Market, MD

## 2015-01-02 NOTE — Care Management (Signed)
Patient still with wheezing and elevated WBC. Slowly improving not at baseline. On chronic o2 at home. Continue to follow.

## 2015-01-03 MED ORDER — PREDNISONE 10 MG (21) PO TBPK: 10.0000 mg | ORAL_TABLET | Freq: Every day | ORAL | Status: DC

## 2015-01-03 MED ORDER — DOCUSATE SODIUM 100 MG PO CAPS: 100.0000 mg | ORAL_CAPSULE | Freq: Two times a day (BID) | ORAL | Status: DC

## 2015-01-03 MED ORDER — BENZONATATE 100 MG PO CAPS: 100.0000 mg | ORAL_CAPSULE | Freq: Three times a day (TID) | ORAL | Status: DC | PRN

## 2015-01-03 NOTE — Care Management Important Message (Signed)
Important Message  Patient Details  Name: Azrael Thakker MRN: [id number] Date of Birth: [birth year]   Medicare Important Message Given:  Yes    Juliann Pulse A Abbrielle Batts 01/03/2015, 10:18 AM

## 2015-01-03 NOTE — Discharge Instructions (Signed)
Activity as tolerated with oxygen via nasal cannula Diet-heart healthy Follow-up with primary care physician in a week Follow-up with pulmonology Dr. Raul Del in 7-10 days Stop smoking

## 2015-01-03 NOTE — Progress Notes (Signed)
Pt stable. IV removed. D/c instructions given and education provided. Prescriptions verified and given. Pt states he understands instructions. Pt dressed and escorted out by staff. Driven home by family.

## 2015-01-03 NOTE — Discharge Summary (Signed)
[facility] [place] NAME: Dawn Donaldson    MR#:  [id number]  DATE OF BIRTH:  [birth year]  DATE OF ADMISSION:  12/28/2014 ADMITTING PHYSICIAN: Nicholes Mango, MD  DATE OF DISCHARGE: 01/03/2015 PRIMARY CARE PHYSICIAN: Lorelee Market, MD    ADMISSION DIAGNOSIS:  COPD with acute exacerbation ([place]) [J44.1]  DISCHARGE DIAGNOSIS:  Active Problems:   COPD exacerbation ([place])   SECONDARY DIAGNOSIS:   Past Medical History  Diagnosis Date  . COPD (chronic obstructive pulmonary disease) ([place])   . Hypertension   . CAD (coronary artery disease)   . Diastolic CHF ([place])   . Hyperlipidemia     HOSPITAL COURSE:   1. COPD exacerbation-very slow clinical progress - chronic respiratory failure 2 L at home, still at baseline oxygen requirement - Improved wheezing, prob her baselinec - Continue Zithromax, DuoNeb's, Solu-Medrol taper to by mouth prednisone -Repeat chest x-ray with COPD but no infiltrates -Incentive spirometry  2. Hypertension - Controlled. Continue Lasix and lisinopril  3. OSA - Continue CPAP with oxygen  4. Chronic diastolic heart failure - No exacerbation, no edema on chest x-ray. Continue Lasix, lisinopril, aspirin and statin - Low sodium diet  5. Constipation Provided laxatives, improved   DISCHARGE CONDITIONS:   fair CONSULTS OBTAINED:  Treatment Team:  Erby Pian, MD   PROCEDURES none   DRUG ALLERGIES:   Allergies  Allergen Reactions  . Dexlansoprazole Nausea And Vomiting  . Ultram [Tramadol] Itching    DISCHARGE MEDICATIONS:   Current Discharge Medication List    START taking these medications   Details  docusate sodium (COLACE) 100 MG capsule Take 1 capsule (100 mg total) by mouth 2 (two) times daily. Qty: 10 capsule, Refills: 0    predniSONE (STERAPRED UNI-PAK 21 TAB) 10 MG (21) TBPK tablet Take 1 tablet (10 mg total) by mouth daily. Take 6 tablets by mouth for 1 day followed by   5 tablets by mouth for 1 day followed by  4 tablets by mouth for 1 day followed by  3 tablets by mouth for 1 day followed by  2 tablets by mouth for 1 day followed by  1 tablet by mouth for a day and stop Qty: 21 tablet, Refills: 0      CONTINUE these medications which have CHANGED   Details  benzonatate (TESSALON) 100 MG capsule Take 1 capsule (100 mg total) by mouth 3 (three) times daily as needed for cough. Qty: 20 capsule, Refills: 0      CONTINUE these medications which have NOT CHANGED   Details  albuterol (PROVENTIL HFA;VENTOLIN HFA) 108 (90 BASE) MCG/ACT inhaler Inhale 2 puffs into the lungs every 6 (six) hours as needed for wheezing or shortness of breath.     albuterol (PROVENTIL) (2.5 MG/3ML) 0.083% nebulizer solution Inhale 3 mLs into the lungs every 4 (four) hours as needed for wheezing or shortness of breath. Qty: 75 mL, Refills: 1    clopidogrel (PLAVIX) 75 MG tablet Take 75 mg by mouth daily.     COMBIVENT RESPIMAT 20-100 MCG/ACT AERS respimat Inhale 1 puff into the lungs 4 (four) times daily. Qty: 1 Inhaler, Refills: 2    Fluticasone-Salmeterol (ADVAIR) 250-50 MCG/DOSE AEPB Inhale 1 puff into the lungs 2 (two) times daily.    furosemide (LASIX) 40 MG tablet Take 40 mg by mouth every other day.     lisinopril (PRINIVIL,ZESTRIL) 20 MG tablet Take 20 mg by mouth daily.     NEXIUM 40  MG capsule Take 40 mg by mouth 2 (two) times daily.     nitroGLYCERIN (NITROSTAT) 0.4 MG SL tablet Place 0.4 mg under the tongue every 5 (five) minutes as needed for chest pain.     Oxycodone HCl 10 MG TABS Take 1 tablet (10 mg total) by mouth every 6 (six) hours as needed. Qty: 20 tablet, Refills: 0    simvastatin (ZOCOR) 20 MG tablet Take 20 mg by mouth daily as needed (for cholesterol.).       STOP taking these medications     LORazepam (ATIVAN) 0.5 MG tablet      aspirin EC 81 MG tablet      LORazepam (ATIVAN) 1 MG tablet          DISCHARGE INSTRUCTIONS:    Activity as tolerated with oxygen via nasal cannula Diet-heart healthy Follow-up with primary care physician in a week Follow-up with pulmonology Dr. Raul Del in 7-10 days Stop smoking   DIET:  Cardiac diet  DISCHARGE CONDITION:  Fair  ACTIVITY:  Activity as tolerated  OXYGEN:  Home Oxygen: Yes.     Oxygen Delivery: 2 liters/min via Patient connected to nasal cannula oxygen  DISCHARGE LOCATION:  home   If you experience worsening of your admission symptoms, develop shortness of breath, life threatening emergency, suicidal or homicidal thoughts you must seek medical attention immediately by calling 911 or calling your MD immediately  if symptoms less severe.  You Must read complete instructions/literature along with all the possible adverse reactions/side effects for all the Medicines you take and that have been prescribed to you. Take any new Medicines after you have completely understood and accpet all the possible adverse reactions/side effects.   Please note  You were cared for by a hospitalist during your hospital stay. If you have any questions about your discharge medications or the care you received while you were in the hospital after you are discharged, you can call the unit and asked to speak with the hospitalist on call if the hospitalist that took care of you is not available. Once you are discharged, your primary care physician will handle any further medical issues. Please note that NO REFILLS for any discharge medications will be authorized once you are discharged, as it is imperative that you return to your primary care physician (or establish a relationship with a primary care physician if you do not have one) for your aftercare needs so that they can reassess your need for medications and monitor your lab values.     Today  Chief Complaint  Patient presents with  . Shortness of Breath   Patient denies any chest pain or shortness of breath. Feeling much  better  ROS:  CONSTITUTIONAL: Denies fevers, chills. Denies any fatigue, weakness.  EYES: Denies blurry vision, double vision, eye pain. EARS, NOSE, THROAT: Denies tinnitus, ear pain, hearing loss. RESPIRATORY: Denies cough, wheeze, shortness of breath.  CARDIOVASCULAR: Denies chest pain, palpitations, edema.  GASTROINTESTINAL: Denies nausea, vomiting, diarrhea, abdominal pain. Denies bright red blood per rectum. GENITOURINARY: Denies dysuria, hematuria. ENDOCRINE: Denies nocturia or thyroid problems. HEMATOLOGIC AND LYMPHATIC: Denies easy bruising or bleeding. SKIN: Denies rash or lesion. MUSCULOSKELETAL: Denies pain in neck, back, shoulder, knees, hips or arthritic symptoms.  NEUROLOGIC: Denies paralysis, paresthesias.  PSYCHIATRIC: Denies anxiety or depressive symptoms.   VITAL SIGNS:  Blood pressure 136/80, pulse 79, temperature 97.9 F (36.6 C), temperature source Oral, resp. rate 20, height 5\' 1"  (1.549 m), weight 93.123 kg (205 lb 4.8 oz), SpO2  98 %.  I/O:   Intake/Output Summary (Last 24 hours) at 01/03/15 1128 Last data filed at 01/03/15 0900  Gross per 24 hour  Intake    480 ml  Output      0 ml  Net    480 ml    PHYSICAL EXAMINATION:  GENERAL:  64 y.o.-year-old patient lying in the bed with no acute distress.  EYES: Pupils equal, round, reactive to light and accommodation. No scleral icterus. Extraocular muscles intact.  HEENT: Head atraumatic, normocephalic. Oropharynx and nasopharynx clear.  NECK:  Supple, no jugular venous distention. No thyroid enlargement, no tenderness.  LUNGS: Normal breath sounds bilaterally, no wheezing, rales,rhonchi or crepitation. No use of accessory muscles of respiration.  CARDIOVASCULAR: S1, S2 normal. No murmurs, rubs, or gallops.  ABDOMEN: Soft, non-tender, non-distended. Bowel sounds present. No organomegaly or mass.  EXTREMITIES: No pedal edema, cyanosis, or clubbing.  NEUROLOGIC: Cranial nerves II through XII are intact. Muscle  strength 5/5 in all extremities. Sensation intact. Gait not checked.  PSYCHIATRIC: The patient is alert and oriented x 3.  SKIN: No obvious rash, lesion, or ulcer.   DATA REVIEW:   CBC  Recent Labs Lab 12/31/14 0452  WBC 16.6*  HGB 12.8  HCT 40.0  PLT 226    Chemistries   Recent Labs Lab 12/28/14 1407  12/31/14 0452  NA 140  < > 142  K 3.6  < > 4.3  CL 107  < > 101  CO2 29  < > 33*  GLUCOSE 120*  < > 142*  BUN 10  < > 23*  CREATININE 0.71  < > 0.74  CALCIUM 9.0  < > 9.2  AST 18  --   --   ALT 14  --   --   ALKPHOS 93  --   --   BILITOT 0.2*  --   --   < > = values in this interval not displayed.  Cardiac Enzymes  Recent Labs Lab 12/28/14 1407  TROPONINI <0.03    Microbiology Results  Results for orders placed or performed during the hospital encounter of 09/18/14  Culture, blood (routine x 2)     Status: None   Collection Time: 09/18/14 10:00 AM  Result Value Ref Range Status   Specimen Description BLOOD RIGHT ASSIST CONTROL  Final   Special Requests NONE  Final   Culture NO GROWTH 5 DAYS  Final   Report Status 09/23/2014 FINAL  Final  Culture, blood (routine x 2)     Status: None   Collection Time: 09/18/14 10:08 AM  Result Value Ref Range Status   Specimen Description BLOOD LEFT ASSIST CONTROL  Final   Special Requests BOTTLES DRAWN AEROBIC AND ANAEROBIC 5CC  Final   Culture NO GROWTH 5 DAYS  Final   Report Status 09/23/2014 FINAL  Final    RADIOLOGY:  Dg Chest 2 View  01/01/2015  CLINICAL DATA:  COPD.  Home oxygen.  Worsening shortness of breath. EXAM: CHEST  2 VIEW COMPARISON:  None. FINDINGS: Heart and mediastinal contours are within normal limits. No focal opacities or effusions. No acute bony abnormality. IMPRESSION: No active cardiopulmonary disease. Electronically Signed   By: Rolm Baptise M.D.   On: 01/01/2015 11:46    EKG:   Orders placed or performed during the hospital encounter of 12/28/14  . EKG 12-Lead  . EKG 12-Lead       Management plans discussed with the patient, family and they are in agreement.  CODE STATUS:  Code Status Orders        Start     Ordered   12/28/14 2046  Full code   Continuous     12/28/14 2045      TOTAL TIME TAKING CARE OF THIS PATIENT: 45 minutes.    @[facility] @  on 01/03/2015 at 11:28 AM  Between 7am to 6pm - Pager - [phone]  After 6pm go to [email or web address] - password [email or web address] [facility]  Office  [phone]  CC: Primary care physician; Lorelee Market, MD

## 2015-01-03 NOTE — Progress Notes (Signed)
Ambulated patient on 2 liters O2. Pt sats at 92% with 2 liters O2 with exertion.

## 2015-01-03 NOTE — Progress Notes (Signed)
Date: 01/03/2015,   MRN# [id number] Dawn Donaldson December 12, 1950 Code Status:     Code Status Orders        Start     Ordered   12/28/14 2046  Full code   Continuous     12/28/14 2045        AdmissionWeight: 200 lb (90.719 kg)                 CurrentWeight: 205 lb 4.8 oz (93.123 kg)  CC: copd exacerbation  HPI: This is a 64 yr old lady here with copd exacerbation. Today she is clinically stable and ready to go home. No angina, pleurisy, wheezing, leg edema, calf pain, ectopy or syncope. No fever or chills. She is known to have stage III copd. She is on cpap. Looking back her repeat sleep study was void of significant apnea. Need to clarify on her return out patient visit.   PMHX:   Past Medical History  Diagnosis Date  . COPD (chronic obstructive pulmonary disease) ([place])   . Hypertension   . CAD (coronary artery disease)   . Diastolic CHF ([place])   . Hyperlipidemia    Surgical Hx:  History reviewed. No pertinent past surgical history. Family Hx:  No family history on file. Social Hx:   Social History  Substance Use Topics  . Smoking status: Never Smoker   . Smokeless tobacco: None  . Alcohol Use: No   Medication:    Home Medication:  Current Outpatient Rx  Name  Route  Sig  Dispense  Refill  . benzonatate (TESSALON) 100 MG capsule   Oral   Take 1 capsule (100 mg total) by mouth 3 (three) times daily as needed for cough.   20 capsule   0   . docusate sodium (COLACE) 100 MG capsule   Oral   Take 1 capsule (100 mg total) by mouth 2 (two) times daily.   10 capsule   0   . predniSONE (STERAPRED UNI-PAK 21 TAB) 10 MG (21) TBPK tablet   Oral   Take 1 tablet (10 mg total) by mouth daily. Take 6 tablets by mouth for 1 day followed by  5 tablets by mouth for 1 day followed by  4 tablets by mouth for 1 day followed by  3 tablets by mouth for 1 day followed by  2 tablets by mouth for 1 day followed by  1 tablet by mouth for a day and stop   21 tablet   0      Current Medication: @CURMEDTAB @   Allergies:  Dexlansoprazole and Ultram  Review of Systems: Gen:  Denies  fever, sweats, chills HEENT: Denies blurred vision, double vision, ear pain, eye pain, hearing loss, nose bleeds, sore throat Cvc:  No dizziness, chest pain or heaviness Resp:  No wheezing, chronic dyspnea close to baseline  Gi: Denies swallowing difficulty, stomach pain, nausea or vomiting, diarrhea, constipation, bowel incontinence Gu:  Denies bladder incontinence, burning urine Ext:   No Joint pain, stiffness or swelling Skin: No skin rash, easy bruising or bleeding or hives Endoc:  No polyuria, polydipsia , polyphagia or weight change Psych: No depression, insomnia or hallucinations  Other:  All other systems negative  Physical Examination:   VS: BP 136/80 mmHg  Pulse 79  Temp(Src) 97.9 F (36.6 C) (Oral)  Resp 20  Ht 5\' 1"  (1.549 m)  Wt 205 lb 4.8 oz (93.123 kg)  BMI 38.81 kg/m2  SpO2 98%  General Appearance: No distress, in  bed comfortable  Neuro/psych without focal findings, mental status, speech normal, alert and oriented, cranial nerves 2-12 intact, reflexes normal and symmetric, sensation grossly normal  HEENT: PERRLA, EOM intact, no ptosis, no other lesions noticed NECK: Supple, no stridor, no jvd Pulmonary:.No wheezing, No rales  Cardiovascular:  Normal S1,S2.  No m/r/g.  Abdominal aorta pulsation normal.    Abdomen:Benign, Soft, non-tender, No masses, hepatosplenomegaly, No lymphadenopathy Endoc: No evident thyromegaly, no signs of acromegaly or Cushing features Skin:   warm, no rashes, no ecchymosis  Extremities: normal, no cyanosis, clubbing, no edema, warm with normal capillary refill.    Labs results:      Rad results:  CLINICAL DATA: COPD. Home oxygen. Worsening shortness of breath.  EXAM: CHEST 2 VIEW  COMPARISON: None.  FINDINGS: Heart and mediastinal contours are within normal limits. No focal opacities or effusions. No acute  bony abnormality.  IMPRESSION: No active cardiopulmonary disease.   Electronically Signed  By: Rolm Baptise M.D.  On: 01/01/2015 11:46   Assessment and Plan: She presented here with copd exacerbation, appropriately treated, no wheezing, on o2. Close to baseline. Ok to go home today.  -complete pred taper -d/c home on the same copd regimen -follow up in pulmonary in 10 days  Obstructive sleep apnea, need to determine need for cpap per recent sleep study  -continue cpap for now -weight loss -avoid sedation  History of diastolic dysfunction Continue cardiac regimen listed above    I have personally obtained a history, examined the patient, evaluated laboratory and imaging results, formulated the assessment and plan and placed orders.  The Patient requires high complexity decision making for assessment and support, frequent evaluation and titration of therapies, application of advanced monitoring technologies and extensive interpretation of multiple databases.   Herbon Fleming,M.D. Pulmonary & Critical care [facility]

## 2015-02-26 ENCOUNTER — Inpatient Hospital Stay
Admission: AD | Admit: 2015-02-26 | Discharge: 2015-02-28 | DRG: 191 | Disposition: A | Discharge status: Home / Self Care | Payer: Medicare Other | Source: Ambulatory Visit | Attending: Internal Medicine | Admitting: Internal Medicine

## 2015-02-26 ENCOUNTER — Inpatient Hospital Stay: Payer: Medicare Other

## 2015-02-26 DIAGNOSIS — I2510 Atherosclerotic heart disease of native coronary artery without angina pectoris: Secondary | ICD-10-CM | POA: Diagnosis present

## 2015-02-26 DIAGNOSIS — K219 Gastro-esophageal reflux disease without esophagitis: Secondary | ICD-10-CM | POA: Diagnosis present

## 2015-02-26 DIAGNOSIS — Z6837 Body mass index (BMI) 37.0-37.9, adult: Secondary | ICD-10-CM

## 2015-02-26 DIAGNOSIS — E785 Hyperlipidemia, unspecified: Secondary | ICD-10-CM | POA: Diagnosis present

## 2015-02-26 DIAGNOSIS — M545 Low back pain: Secondary | ICD-10-CM | POA: Diagnosis present

## 2015-02-26 DIAGNOSIS — Z9861 Coronary angioplasty status: Secondary | ICD-10-CM | POA: Diagnosis not present

## 2015-02-26 DIAGNOSIS — J441 Chronic obstructive pulmonary disease with (acute) exacerbation: Principal | ICD-10-CM | POA: Diagnosis present

## 2015-02-26 DIAGNOSIS — F17210 Nicotine dependence, cigarettes, uncomplicated: Secondary | ICD-10-CM | POA: Diagnosis present

## 2015-02-26 DIAGNOSIS — Z7902 Long term (current) use of antithrombotics/antiplatelets: Secondary | ICD-10-CM | POA: Diagnosis not present

## 2015-02-26 DIAGNOSIS — Z8673 Personal history of transient ischemic attack (TIA), and cerebral infarction without residual deficits: Secondary | ICD-10-CM | POA: Diagnosis not present

## 2015-02-26 DIAGNOSIS — J9801 Acute bronchospasm: Secondary | ICD-10-CM | POA: Diagnosis present

## 2015-02-26 DIAGNOSIS — Z9981 Dependence on supplemental oxygen: Secondary | ICD-10-CM | POA: Diagnosis not present

## 2015-02-26 DIAGNOSIS — G8929 Other chronic pain: Secondary | ICD-10-CM | POA: Diagnosis present

## 2015-02-26 DIAGNOSIS — E669 Obesity, unspecified: Secondary | ICD-10-CM | POA: Diagnosis present

## 2015-02-26 DIAGNOSIS — I5030 Unspecified diastolic (congestive) heart failure: Secondary | ICD-10-CM | POA: Diagnosis present

## 2015-02-26 DIAGNOSIS — I110 Hypertensive heart disease with heart failure: Secondary | ICD-10-CM | POA: Diagnosis present

## 2015-02-26 DIAGNOSIS — Z79891 Long term (current) use of opiate analgesic: Secondary | ICD-10-CM

## 2015-02-26 DIAGNOSIS — Z888 Allergy status to other drugs, medicaments and biological substances status: Secondary | ICD-10-CM | POA: Diagnosis not present

## 2015-02-26 DIAGNOSIS — M810 Age-related osteoporosis without current pathological fracture: Secondary | ICD-10-CM | POA: Diagnosis present

## 2015-02-26 DIAGNOSIS — R0600 Dyspnea, unspecified: Secondary | ICD-10-CM

## 2015-02-26 DIAGNOSIS — Z79899 Other long term (current) drug therapy: Secondary | ICD-10-CM

## 2015-02-26 HISTORY — DX: Cerebral infarction, unspecified: I63.9

## 2015-02-26 HISTORY — DX: Age-related osteoporosis without current pathological fracture: M81.0

## 2015-02-26 HISTORY — DX: Low back pain: M54.5

## 2015-02-26 HISTORY — DX: Gastro-esophageal reflux disease without esophagitis: K21.9

## 2015-02-26 HISTORY — DX: Polyp of colon: K63.5

## 2015-02-26 HISTORY — DX: Other chronic pain: G89.29

## 2015-02-26 HISTORY — DX: Unspecified osteoarthritis, unspecified site: M19.90

## 2015-02-26 HISTORY — DX: Low back pain, unspecified: M54.50

## 2015-02-26 LAB — CBC
HEMATOCRIT: 40.7 % (ref 35.0–47.0)
HEMOGLOBIN: 13.7 g/dL (ref 12.0–16.0)
MCH: 31.5 pg (ref 26.0–34.0)
MCHC: 33.6 g/dL (ref 32.0–36.0)
MCV: 93.6 fL (ref 80.0–100.0)
Platelets: 248 10*3/uL (ref 150–440)
RBC: 4.34 MIL/uL (ref 3.80–5.20)
RDW: 14.2 % (ref 11.5–14.5)
WBC: 14.2 10*3/uL — AB (ref 3.6–11.0)

## 2015-02-26 LAB — BASIC METABOLIC PANEL
ANION GAP: 4 — AB (ref 5–15)
BUN: 15 mg/dL (ref 6–20)
CHLORIDE: 107 mmol/L (ref 101–111)
CO2: 30 mmol/L (ref 22–32)
Calcium: 9.1 mg/dL (ref 8.9–10.3)
Creatinine, Ser: 0.75 mg/dL (ref 0.44–1.00)
GFR calc non Af Amer: >60 mL/min (ref >60)
GLUCOSE: 150 mg/dL — AB (ref 65–99)
Potassium: 4.1 mmol/L (ref 3.5–5.1)
Sodium: 141 mmol/L (ref 135–145)

## 2015-02-26 MED ORDER — PANTOPRAZOLE SODIUM 40 MG PO TBEC
40.0000 mg | DELAYED_RELEASE_TABLET | Freq: Every day | ORAL | Status: DC
  Administered 2015-02-26 – 2015-02-28 (×3): 40 mg via ORAL
  Filled 2015-02-26 (×3): qty 1

## 2015-02-26 MED ORDER — LISINOPRIL 20 MG PO TABS
20.0000 mg | ORAL_TABLET | Freq: Every day | ORAL | Status: DC
  Administered 2015-02-27 – 2015-02-28 (×2): 20 mg via ORAL
  Filled 2015-02-26 (×2): qty 1

## 2015-02-26 MED ORDER — ONDANSETRON HCL 4 MG/2ML IJ SOLN
4.0000 mg | Freq: Four times a day (QID) | INTRAMUSCULAR | Status: DC | PRN
  Administered 2015-02-28: 08:00:00 4 mg via INTRAVENOUS
  Filled 2015-02-26 (×2): qty 2

## 2015-02-26 MED ORDER — ACETAMINOPHEN 325 MG PO TABS: 650.0000 mg | ORAL_TABLET | Freq: Four times a day (QID) | ORAL | Status: DC | PRN

## 2015-02-26 MED ORDER — ACETAMINOPHEN 650 MG RE SUPP: 650.0000 mg | Freq: Four times a day (QID) | RECTAL | Status: DC | PRN

## 2015-02-26 MED ORDER — OXYCODONE HCL 5 MG PO TABS
10.0000 mg | ORAL_TABLET | Freq: Four times a day (QID) | ORAL | Status: DC | PRN
  Administered 2015-02-26: 18:00:00 10 mg via ORAL
  Filled 2015-02-26: qty 2

## 2015-02-26 MED ORDER — OXYCODONE HCL 5 MG PO TABS
10.0000 mg | ORAL_TABLET | ORAL | Status: DC | PRN
  Administered 2015-02-26 – 2015-02-28 (×9): 10 mg via ORAL
  Filled 2015-02-26 (×9): qty 2

## 2015-02-26 MED ORDER — IPRATROPIUM-ALBUTEROL 0.5-2.5 (3) MG/3ML IN SOLN: 3.0000 mL | Freq: Four times a day (QID) | RESPIRATORY_TRACT | Status: DC

## 2015-02-26 MED ORDER — IPRATROPIUM-ALBUTEROL 0.5-2.5 (3) MG/3ML IN SOLN
3.0000 mL | RESPIRATORY_TRACT | Status: DC
  Administered 2015-02-26 – 2015-02-28 (×10): 3 mL via RESPIRATORY_TRACT
  Filled 2015-02-26 (×9): qty 3

## 2015-02-26 MED ORDER — ENOXAPARIN SODIUM 40 MG/0.4ML ~~LOC~~ SOLN
40.0000 mg | SUBCUTANEOUS | Status: DC
  Administered 2015-02-26 – 2015-02-27 (×2): 40 mg via SUBCUTANEOUS
  Filled 2015-02-26 (×2): qty 0.4

## 2015-02-26 MED ORDER — CLOPIDOGREL BISULFATE 75 MG PO TABS
75.0000 mg | ORAL_TABLET | Freq: Every day | ORAL | Status: DC
  Administered 2015-02-26 – 2015-02-28 (×3): 75 mg via ORAL
  Filled 2015-02-26 (×3): qty 1

## 2015-02-26 MED ORDER — MOMETASONE FURO-FORMOTEROL FUM 100-5 MCG/ACT IN AERO
2.0000 | INHALATION_SPRAY | Freq: Two times a day (BID) | RESPIRATORY_TRACT | Status: DC
  Administered 2015-02-26 – 2015-02-28 (×4): 2 via RESPIRATORY_TRACT
  Filled 2015-02-26: qty 8.8

## 2015-02-26 MED ORDER — FUROSEMIDE 40 MG PO TABS
40.0000 mg | ORAL_TABLET | ORAL | Status: DC
  Administered 2015-02-26 – 2015-02-28 (×2): 40 mg via ORAL
  Filled 2015-02-26 (×2): qty 1

## 2015-02-26 MED ORDER — SIMVASTATIN 20 MG PO TABS
20.0000 mg | ORAL_TABLET | Freq: Every day | ORAL | Status: DC
  Administered 2015-02-26 – 2015-02-27 (×2): 20 mg via ORAL
  Filled 2015-02-26 (×2): qty 1

## 2015-02-26 MED ORDER — METHYLPREDNISOLONE SODIUM SUCC 125 MG IJ SOLR
60.0000 mg | Freq: Four times a day (QID) | INTRAMUSCULAR | Status: DC
  Administered 2015-02-26 – 2015-02-27 (×4): 60 mg via INTRAVENOUS
  Filled 2015-02-26 (×4): qty 2

## 2015-02-26 MED ORDER — ONDANSETRON HCL 4 MG PO TABS
4.0000 mg | ORAL_TABLET | Freq: Four times a day (QID) | ORAL | Status: DC | PRN
  Administered 2015-02-26 – 2015-02-27 (×3): 4 mg via ORAL
  Filled 2015-02-26 (×3): qty 1

## 2015-02-26 NOTE — H&P (Signed)
[facility] [place] NAME: Dawn Donaldson    MR#:  [id number]  DATE OF BIRTH:  [birth year]  DATE OF ADMISSION:  02/26/2015  PRIMARY CARE PHYSICIAN: Lorelee Market, MD   REQUESTING/REFERRING PHYSICIAN: Dr. Wallene Huh  CHIEF COMPLAINT:  No chief complaint on file.   HISTORY OF PRESENT ILLNESS:  Dawn Donaldson  is a 65 y.o. female with a known history of COPD on nocturnal home oxygen, hypertension, CAD, diastolic CHF, hyperlipidemia and chronic low back pain presents from, pulmonologist office secondary to worsening shortness of breath.  Patient has seen her GI doctor last week and was in the process of scheduling a routine screening colonoscopy due to history of colon polyps. She was advised to see pulmonary before the procedure for clearance. When she was in her pulmonologist office, patient appeared tachypneic and had audible diffuse scattered wheezing. In spite of her dose of steroid and also neb treatments over there she did not improve. So she was sent over to the hospital for admission. Patient states her symptoms started about a week ago, she used her nebulizer and also was taking her inhalers like she was supposed to with no improvement in symptoms. He denies any fevers or chills. No nausea vomiting or diarrhea or abdominal pain. She has been using her nocturnal oxygen as needed for exertion lately as she also has a tank.  PAST MEDICAL HISTORY:   Past Medical History  Diagnosis Date  . COPD (chronic obstructive pulmonary disease) ([place])     on nocturnal home o2  . Hypertension   . CAD (coronary artery disease)     s/p PCI  . Diastolic CHF ([place])   . Hyperlipidemia   . GERD (gastroesophageal reflux disease)   . Osteoporosis   . Chronic low back pain   . CVA (cerebral infarction)   . DJD (degenerative joint disease)   . Colon polyps     PAST SURGICAL HISTORY:   Past Surgical History  Procedure Laterality Date  . Carotid  endarterectomy      Right  . Cholecystectomy    . Appendectomy    . Back surgery    . Partial hysterectomy      SOCIAL HISTORY:   Social History  Substance Use Topics  . Smoking status: Current Every Day Smoker -- 0.10 packs/day    Types: Cigarettes  . Smokeless tobacco: Never Used  . Alcohol Use: No    FAMILY HISTORY:   Family History  Problem Relation Age of Onset  . Atrial fibrillation Mother   . CAD Father     DRUG ALLERGIES:   Allergies  Allergen Reactions  . Dexlansoprazole Nausea And Vomiting  . Ultram [Tramadol] Itching    REVIEW OF SYSTEMS:   Review of Systems  Constitutional: Negative for fever, chills, weight loss and malaise/fatigue.  HENT: Positive for hearing loss. Negative for ear discharge, ear pain, nosebleeds and tinnitus.   Eyes: Negative for blurred vision, double vision and photophobia.  Respiratory: Positive for cough, shortness of breath and wheezing. Negative for hemoptysis.   Cardiovascular: Negative for chest pain, palpitations, orthopnea and leg swelling.  Gastrointestinal: Negative for heartburn, nausea, vomiting, abdominal pain, diarrhea, constipation and melena.  Genitourinary: Negative for dysuria, urgency, frequency and hematuria.  Musculoskeletal: Positive for back pain. Negative for myalgias and neck pain.  Skin: Negative for rash.  Neurological: Negative for dizziness, tingling, tremors, sensory change, speech change, focal weakness and headaches.  Endo/Heme/Allergies: Does not bruise/bleed easily.  Psychiatric/Behavioral: Negative for depression.    MEDICATIONS AT HOME:   Prior to Admission medications   Medication Sig Start Date End Date Taking? Authorizing Provider  furosemide (LASIX) 40 MG tablet Take 40 mg by mouth every other day.  05/14/14  Yes Historical Provider, MD  lisinopril (PRINIVIL,ZESTRIL) 20 MG tablet Take 20 mg by mouth daily.  05/14/14  Yes Historical Provider, MD  NEXIUM 40 MG capsule Take 40 mg by mouth 2  (two) times daily.  05/14/14  Yes Historical Provider, MD  simvastatin (ZOCOR) 20 MG tablet Take 20 mg by mouth daily as needed (for cholesterol.).  05/14/14  Yes Historical Provider, MD  albuterol (PROVENTIL HFA;VENTOLIN HFA) 108 (90 BASE) MCG/ACT inhaler Inhale 2 puffs into the lungs every 6 (six) hours as needed for wheezing or shortness of breath.     Historical Provider, MD  albuterol (PROVENTIL) (2.5 MG/3ML) 0.083% nebulizer solution Inhale 3 mLs into the lungs every 4 (four) hours as needed for wheezing or shortness of breath. 05/27/14   Gladstone Lighter, MD  clopidogrel (PLAVIX) 75 MG tablet Take 75 mg by mouth daily.  05/14/14   Historical Provider, MD  COMBIVENT RESPIMAT 20-100 MCG/ACT AERS respimat Inhale 1 puff into the lungs 4 (four) times daily. 05/27/14   Gladstone Lighter, MD  Fluticasone-Salmeterol (ADVAIR) 250-50 MCG/DOSE AEPB Inhale 1 puff into the lungs 2 (two) times daily.    Historical Provider, MD  nitroGLYCERIN (NITROSTAT) 0.4 MG SL tablet Place 0.4 mg under the tongue every 5 (five) minutes as needed for chest pain.  05/24/12   Historical Provider, MD  Oxycodone HCl 10 MG TABS Take 1 tablet (10 mg total) by mouth every 6 (six) hours as needed. Patient taking differently: Take 10 mg by mouth every 4 (four) hours as needed (for pain.).  05/27/14   Gladstone Lighter, MD      VITAL SIGNS:  Blood pressure 141/83, pulse 90, temperature 98.6 F (37 C), temperature source Oral, resp. rate 24, height 5\' 2"  (1.575 m), weight 92.987 kg (205 lb), SpO2 94 %.  PHYSICAL EXAMINATION:   Physical Exam  GENERAL:  65 y.o.-year-old obese patient lying in the bed, appears tachypneic and wheezing audibly.  EYES: Pupils equal, round, reactive to light and accommodation. No scleral icterus. Extraocular muscles intact.  HEENT: Head atraumatic, normocephalic. Oropharynx and nasopharynx clear.  NECK:  Supple, no jugular venous distention. No thyroid enlargement, no tenderness.  LUNGS: Diffuse  expiratory wheezing all over the lung fields. No rales,rhonchi or crepitation. No use of accessory muscles of respiration.  CARDIOVASCULAR: S1, S2 normal. No murmurs, rubs, or gallops.  ABDOMEN: Soft, nontender, nondistended. Bowel sounds present. No organomegaly or mass.  EXTREMITIES: No pedal edema, cyanosis, or clubbing.  NEUROLOGIC: Cranial nerves II through XII are intact. Muscle strength 5/5 in all extremities. Sensation intact. Gait not checked.  PSYCHIATRIC: The patient is alert and oriented x 3.  SKIN: No obvious rash, lesion, or ulcer.   LABORATORY PANEL:   CBC No results for input(s): WBC, HGB, HCT, PLT in the last 168 hours. ------------------------------------------------------------------------------------------------------------------  Chemistries  No results for input(s): NA, K, CL, CO2, GLUCOSE, BUN, CREATININE, CALCIUM, MG, AST, ALT, ALKPHOS, BILITOT in the last 168 hours.  Invalid input(s): GFRCGP ------------------------------------------------------------------------------------------------------------------  Cardiac Enzymes No results for input(s): TROPONINI in the last 168 hours. ------------------------------------------------------------------------------------------------------------------  RADIOLOGY:  No results found.  EKG:   Orders placed or performed during the hospital encounter of 02/26/15  . EKG 12-Lead  . EKG 12-Lead    IMPRESSION  AND PLAN:   Tavie Ohlsen  is a 65 y.o. female with a known history of COPD on nocturnal home oxygen, hypertension, CAD, diastolic CHF, hyperlipidemia and chronic low back pain presents from, pulmonologist office secondary to worsening shortness of breath.   #1 acute on chronic COPD exacerbation-continue oxygen support. -IV steroids and DuoNeb's. -Continue inhalers. Counseled again to quit smoking.  #2 hypertension-on lisinopril and on Lasix.  #3 hyperlipidemia-continue statin  #4 CAD-stable at this time. Continue  Plavix, statin and lisinopril. Not on any beer blockers. Able hold off on starting at this time due to bronchospasm.  #5 chronic low back pain-continue her oxycodone as needed orally  #6 DVT prophylaxis-started on Lovenox    All the records are reviewed and case discussed with ED provider. Management plans discussed with the patient, family and they are in agreement.  CODE STATUS: Full code  TOTAL TIME TAKING CARE OF THIS PATIENT: 50 minutes.    Gladstone Lighter M.D on 02/26/2015 at 4:43 PM  Between 7am to 6pm - Pager - [phone]  After 6pm go to [email or web address] - password [email or web address] [facility]  Office  [phone]  CC: Primary care physician; Lorelee Market, MD

## 2015-02-27 LAB — URINALYSIS COMPLETE WITH MICROSCOPIC (ARMC ONLY)
Bacteria, UA: NONE SEEN
Bilirubin Urine: NEGATIVE
Glucose, UA: NEGATIVE mg/dL
KETONES UR: NEGATIVE mg/dL
LEUKOCYTES UA: NEGATIVE
Nitrite: NEGATIVE
PH: 6 (ref 5.0–8.0)
PROTEIN: NEGATIVE mg/dL
SPECIFIC GRAVITY, URINE: 1.012 (ref 1.005–1.030)

## 2015-02-27 LAB — BASIC METABOLIC PANEL
Anion gap: 7 (ref 5–15)
BUN: 18 mg/dL (ref 6–20)
CALCIUM: 9 mg/dL (ref 8.9–10.3)
CO2: 29 mmol/L (ref 22–32)
CREATININE: 0.69 mg/dL (ref 0.44–1.00)
Chloride: 102 mmol/L (ref 101–111)
GFR calc Af Amer: >60 mL/min (ref >60)
GLUCOSE: 150 mg/dL — AB (ref 65–99)
Potassium: 4.5 mmol/L (ref 3.5–5.1)
SODIUM: 138 mmol/L (ref 135–145)

## 2015-02-27 LAB — CBC
HCT: 40.6 % (ref 35.0–47.0)
Hemoglobin: 13.5 g/dL (ref 12.0–16.0)
MCH: 31.5 pg (ref 26.0–34.0)
MCHC: 33.3 g/dL (ref 32.0–36.0)
MCV: 94.7 fL (ref 80.0–100.0)
PLATELETS: 232 10*3/uL (ref 150–440)
RBC: 4.29 MIL/uL (ref 3.80–5.20)
RDW: 13.8 % (ref 11.5–14.5)
WBC: 12.3 10*3/uL — AB (ref 3.6–11.0)

## 2015-02-27 MED ORDER — HYDROCOD POLST-CPM POLST ER 10-8 MG/5ML PO SUER
5.0000 mL | Freq: Two times a day (BID) | ORAL | Status: DC | PRN
  Administered 2015-02-27 – 2015-02-28 (×3): 5 mL via ORAL
  Filled 2015-02-27 (×3): qty 5

## 2015-02-27 MED ORDER — METHYLPREDNISOLONE SODIUM SUCC 40 MG IJ SOLR
40.0000 mg | Freq: Two times a day (BID) | INTRAMUSCULAR | Status: DC
  Administered 2015-02-27 – 2015-02-28 (×2): 40 mg via INTRAVENOUS
  Filled 2015-02-27 (×2): qty 1

## 2015-02-27 NOTE — Evaluation (Signed)
Physical Therapy Evaluation Patient Details Name: Dawn Donaldson MRN: [id number] DOB: [birth year] Today's Date: 02/27/2015   History of Present Illness  Dawn Donaldson is a 65 y.o. female with a known history of COPD on nocturnal home oxygen, hypertension, CAD, diastolic CHF, hyperlipidemia and chronic low back pain presents from, pulmonologist office secondary to worsening shortness of breath.  Patient reports using 2L O2 at home at night and sometimes during the day depending on activity level; She was admitted to hospital with COPD exacerbation;   Clinical Impression  65 yo Female has been admitted to hospital multiple times in last year. Patient presents to hospital with increased shortness of breath due to COPD flare up.Patient is on O2 at home at night and then sometimes will use during the day based on activity level. She lives with her husband who is available to help with ADLs as needed. Patient reports being mod I with all ADLs. She denies using any assistive device. Currently she is mod I with bed mobility, independent with sit<>stand transfers. She was able to ambulate 175 feet without AD, independently with slower gait speed (1.8 feet/sec) narrow base of support, decreased step length. Her SPO2 prior to gait was 96%, following gait on room air, SPO2 82% and then recovered to 94% after 1 min of sitting/rest. Patient was wheezing and short of breath after gait and went back on 2L O2 for better O2 levels. PT educated patient on pulmonary rehab and lung works program to assist with cardiopulmonary endurance. However based on independence level she is not appropriate for skilled PT intervention at this time. Patient agreeable.    Follow Up Recommendations No PT follow up    Equipment Recommendations  None recommended by PT    Recommendations for Other Services       Precautions / Restrictions Precautions Precautions: None (low fall risk) Restrictions Weight Bearing Restrictions: No       Mobility  Bed Mobility Overal bed mobility: Modified Independent             General bed mobility comments: uses elevated head of bed, bed rails;   Transfers Overall transfer level: Independent Equipment used: None             General transfer comment: sit<> stand independently using hands to push up/reach down with transfers;  Ambulation/Gait Ambulation/Gait assistance: Independent Ambulation Distance (Feet): 175 Feet Assistive device: None Gait Pattern/deviations: WFL(Within Functional Limits);Step-through pattern;Narrow base of support;Decreased step length - right;Decreased step length - left Gait velocity: 1.8 feet/sec with 10 feet/sec walk test;      Stairs            Wheelchair Mobility    Modified Rankin (Stroke Patients Only)       Balance Overall balance assessment: Needs assistance Sitting-balance support: No upper extremity supported Sitting balance-Leahy Scale: Good     Standing balance support: No upper extremity supported Standing balance-Leahy Scale: Fair Standing balance comment: able to stand a few seconds with eyes closed with feet together; slight sway with gait tasks but overall WFL                             Pertinent Vitals/Pain Pain Assessment: 0-10 Pain Score: 7  Pain Location: low back/neck Pain Descriptors / Indicators: Aching;Sore Pain Intervention(s): Limited activity within patient's tolerance;Repositioned;Monitored during session    [place] expects to be discharged to:: Private residence Living Arrangements: Spouse/significant other Available Help at Discharge:  Family Type of Home: Mobile home Home Access: Stairs to enter Entrance Stairs-Rails: Right;Left;Can reach both Entrance Stairs-Number of Steps: 2 Home Layout: One level Home Equipment: None      Prior Function Level of Independence: Independent         Comments: Patient reports being independent in all ADLs  including bathing/dressing; she does report needing cart when walking at story and needing extra rest breaks with standing/walking due to shortness of breath     Hand Dominance        Extremity/Trunk Assessment   Upper Extremity Assessment: Overall WFL for tasks assessed           Lower Extremity Assessment: Overall WFL for tasks assessed      Cervical / Trunk Assessment: Kyphotic  Communication   Communication: No difficulties  Cognition Arousal/Alertness: Awake/alert Behavior During Therapy: WFL for tasks assessed/performed Overall Cognitive Status: Within Functional Limits for tasks assessed                      General Comments General comments (skin integrity, edema, etc.): grossly intact;     Exercises        Assessment/Plan    PT Assessment Patent does not need any further PT services  PT Diagnosis Generalized weakness   PT Problem List    PT Treatment Interventions     PT Goals (Current goals can be found in the Care Plan section) Acute Rehab PT Goals Patient Stated Goal: to go home PT Goal Formulation: With patient Time For Goal Achievement: 02/27/15 Potential to Achieve Goals: Good    Frequency     Barriers to discharge        Co-evaluation               End of Session Equipment Utilized During Treatment: Gait belt Activity Tolerance: Patient tolerated treatment well Patient left: in bed           Time: [id number] PT Time Calculation (min) (ACUTE ONLY): 13 min   Charges:   PT Evaluation $PT Eval Low Complexity: 1 Procedure     PT G Codes:        Trotter,Margaret PT, DPT 02/27/2015, 5:03 PM

## 2015-02-27 NOTE — Progress Notes (Signed)
[facility] at [place] NAME: Dawn Donaldson    MR#:  [id number]  DATE OF BIRTH:  21-Nov-1950  SUBJECTIVE:  SOB has improved still with wheezing  REVIEW OF SYSTEMS:    Review of Systems  Constitutional: Negative for fever, chills and malaise/fatigue.  HENT: Negative for sore throat.   Eyes: Negative for blurred vision.  Respiratory: Positive for cough and wheezing. Negative for hemoptysis and shortness of breath.   Cardiovascular: Negative for chest pain, palpitations and leg swelling.  Gastrointestinal: Negative for nausea, vomiting, abdominal pain, diarrhea and blood in stool.  Genitourinary: Negative for dysuria.  Musculoskeletal: Negative for back pain.  Neurological: Negative for dizziness, tremors and headaches.  Endo/Heme/Allergies: Does not bruise/bleed easily.    Tolerating Diet:yes      DRUG ALLERGIES:   Allergies  Allergen Reactions  . Dexlansoprazole Nausea And Vomiting  . Ultram [Tramadol] Itching    VITALS:  Blood pressure 139/72, pulse 75, temperature 97.8 F (36.6 C), temperature source Oral, resp. rate 22, height 5\' 2"  (1.575 m), weight 92.987 kg (205 lb), SpO2 96 %.  PHYSICAL EXAMINATION:   Physical Exam  Constitutional: She is oriented to person, place, and time and well-developed, well-nourished, and in no distress. No distress.  HENT:  Head: Normocephalic.  Eyes: No scleral icterus.  Neck: Normal range of motion. Neck supple. No JVD present. No tracheal deviation present.  Cardiovascular: Normal rate, regular rhythm and normal heart sounds.  Exam reveals no gallop and no friction rub.   No murmur heard. Pulmonary/Chest: Effort normal. No respiratory distress. She has wheezes. She has no rales. She exhibits no tenderness.  Abdominal: Soft. Bowel sounds are normal. She exhibits no distension and no mass. There is no tenderness. There is no rebound and no guarding.  Musculoskeletal: Normal range of  motion. She exhibits no edema.  Neurological: She is alert and oriented to person, place, and time.  Skin: Skin is warm. No rash noted. No erythema.  Psychiatric: Affect and judgment normal.      LABORATORY PANEL:   CBC  Recent Labs Lab 02/27/15 0550  WBC 12.3*  HGB 13.5  HCT 40.6  PLT 232   ------------------------------------------------------------------------------------------------------------------  Chemistries   Recent Labs Lab 02/27/15 0550  NA 138  K 4.5  CL 102  CO2 29  GLUCOSE 150*  BUN 18  CREATININE 0.69  CALCIUM 9.0   ------------------------------------------------------------------------------------------------------------------  Cardiac Enzymes No results for input(s): TROPONINI in the last 168 hours. ------------------------------------------------------------------------------------------------------------------  RADIOLOGY:  Dg Chest 2 View  02/26/2015  CLINICAL DATA:  Dyspnea.  Wheezing.  Shortness of breath.  COPD. EXAM: CHEST  2 VIEW COMPARISON:  01/01/2015 FINDINGS: The heart size and mediastinal contours are within normal limits. Both lungs are clear. The visualized skeletal structures are unremarkable. IMPRESSION: No active cardiopulmonary disease. Electronically Signed   By: Earle Gell M.D.   On: 02/26/2015 19:05     ASSESSMENT AND PLAN:    65 y.o. female with a known history of COPD on nocturnal home oxygen, hypertension, CAD, diastolic CHF, hyperlipidemia and chronic low back pain presents from, pulmonologist office secondary to worsening shortness of breath.   1 acute on chronic COPD exacerbation: Wean IV steroids today Continue oxygen support with goal of weaning off O2 Continue inhalers.   2 essential hypertension: Continue lisinopril and Lasix.  3 hyperlipidemia-continue Simvastatin  4 CAD- Continue Plavix, statin and lisinopril. Not on any BB due to COPD?  5 chronic low back pain-continue  her oxycodone as needed  orally  6  Tobacco dependence: Encouraged to compltetely stop smoking. counseled 3 minutes      Management plans discussed with the patient and she is in agreement.  CODE STATUS: FULL  TOTAL TIME TAKING CARE OF THIS PATIENT: 30 minutes.     POSSIBLE D/C tomorrow DEPENDING ON CLINICAL CONDITION.   Treshon Stannard M.D on 02/27/2015 at 9:59 AM  Between 7am to 6pm - Pager - [phone] After 6pm go to [email or web address] - password [email or web address] [facility]  Office  [phone]  CC: Primary care physician; Lorelee Market, MD  Note: This dictation was prepared with Dragon dictation along with smaller phrase technology. Any transcriptional errors that result from this process are unintentional.

## 2015-02-28 MED ORDER — OXYCODONE HCL 10 MG PO TABS: 10.0000 mg | ORAL_TABLET | ORAL | Status: AC | PRN

## 2015-02-28 MED ORDER — PREDNISONE 10 MG PO TABS: 10.0000 mg | ORAL_TABLET | Freq: Every day | ORAL | Status: DC

## 2015-02-28 NOTE — Care Management (Signed)
Admitted to this facility with the diagnosis of COPD. Lives with friend, Ashok Pall [phone]). Last seen Dr, Bernita Buffy about a month ago. No Home Health. No skilled facility. Uses no aids for ambulation. Takes care of all basic activities of daily living herself, drives. Home oxygen thru [facility] x 5 years. No falls. Appetite  O.K.   Ernestine Conrad will transport. Shelbie Ammons RN MSN CCM Care Management [phone]

## 2015-02-28 NOTE — Progress Notes (Signed)
MD order received to discharge pt home today; verbally reviewed AVS with pt including medications/gave Rxs to pt for Oxycodone (MD did not sign Rx), pt verbalized that she can not get narcotic Rxs filled due to the fact that she goes to a pain clinic/Dr Neimeyer and a Rx for Prednisione; diet/low salt; activity as tolerated; follow up appointment with Dr Bernita Buffy on 03/11/14; no questions voiced at this time; pt discharged via wheelchair by volunteer to the medical mall entrance for discharge home

## 2015-02-28 NOTE — Care Management Important Message (Signed)
Important Message  Patient Details  Name: Dawn Donaldson MRN: [id number] Date of Birth: [birth year]   Medicare Important Message Given:  Yes    Juliann Pulse A Regan Mcbryar 02/28/2015, 11:41 AM

## 2015-02-28 NOTE — Discharge Summary (Signed)
[facility] [place] NAME: Dawn Donaldson    MR#:  [id number]  DATE OF BIRTH:  10/05/1950  DATE OF ADMISSION:  02/26/2015 ADMITTING PHYSICIAN: Gladstone Lighter, MD  DATE OF DISCHARGE: *02/28/2015 PRIMARY CARE PHYSICIAN: Lorelee Market, MD    ADMISSION DIAGNOSIS:  COPD Exacerbation  DISCHARGE DIAGNOSIS:  Active Problems:   COPD exacerbation ([place])   SECONDARY DIAGNOSIS:   Past Medical History  Diagnosis Date  . COPD (chronic obstructive pulmonary disease) ([place])     on nocturnal home o2  . Hypertension   . CAD (coronary artery disease)     s/p PCI  . Diastolic CHF ([place])   . Hyperlipidemia   . GERD (gastroesophageal reflux disease)   . Osteoporosis   . Chronic low back pain   . CVA (cerebral infarction)   . DJD (degenerative joint disease)   . Colon polyps     HOSPITAL COURSE:   65 y.o. female with a known history of COPD on nocturnal home oxygen, hypertension, CAD, diastolic CHF, hyperlipidemia and chronic low back pain presents from, pulmonologist office secondary to worsening shortness of breath.   1 acute on chronic COPD exacerbation: Patient was started on IV steroids and has been weaned to oral steroids at discharge per patient did not require antibiotics that she was on antibiotics prior to admission. She had no evidence of pneumonia and chest x-ray. She has improved dramatically. She has very little wheezing on expiration on examination. She is not hypoxic.   2 essential hypertension: Continue lisinopril and Lasix at discharge  3 hyperlipidemia-continue Simvastatin  4 CAD- Continue Plavix, statin and lisinopril. Not on any BB due to COPD? This can be addressed by her PCP  5 chronic low back pain-continue her oxycodone as needed orally  6 Tobacco dependence: Encouraged to completely stop smoking. counseled 3 minutes   DISCHARGE CONDITIONS AND DIET:   Patient stable for discharge on a heart healthy  diet  CONSULTS OBTAINED:     DRUG ALLERGIES:   Allergies  Allergen Reactions  . Dexlansoprazole Nausea And Vomiting  . Ultram [Tramadol] Itching    DISCHARGE MEDICATIONS:   Current Discharge Medication List    START taking these medications   Details  predniSONE (DELTASONE) 10 MG tablet Take 1 tablet (10 mg total) by mouth daily with breakfast. 60 mg PO (ORAL) x 2 days 50 mg PO x 2 days 40 mg PO x 2 days 30 mg PO x 2 days 20 mg PO x 2 days 10 mg PO x 2 days then stop Qty: 42 tablet, Refills: 0      CONTINUE these medications which have CHANGED   Details  Oxycodone HCl 10 MG TABS Take 1 tablet (10 mg total) by mouth every 4 (four) hours as needed (for pain.). Qty: 20 tablet, Refills: 0      CONTINUE these medications which have NOT CHANGED   Details  furosemide (LASIX) 40 MG tablet Take 40 mg by mouth every other day.     lisinopril (PRINIVIL,ZESTRIL) 20 MG tablet Take 20 mg by mouth daily.     NEXIUM 40 MG capsule Take 40 mg by mouth 2 (two) times daily.     simvastatin (ZOCOR) 20 MG tablet Take 20 mg by mouth daily as needed (for cholesterol.).     albuterol (PROVENTIL HFA;VENTOLIN HFA) 108 (90 BASE) MCG/ACT inhaler Inhale 2 puffs into the lungs every 6 (six) hours as needed for wheezing or shortness of breath.  albuterol (PROVENTIL) (2.5 MG/3ML) 0.083% nebulizer solution Inhale 3 mLs into the lungs every 4 (four) hours as needed for wheezing or shortness of breath. Qty: 75 mL, Refills: 1    clopidogrel (PLAVIX) 75 MG tablet Take 75 mg by mouth daily.     COMBIVENT RESPIMAT 20-100 MCG/ACT AERS respimat Inhale 1 puff into the lungs 4 (four) times daily. Qty: 1 Inhaler, Refills: 2    Fluticasone-Salmeterol (ADVAIR) 250-50 MCG/DOSE AEPB Inhale 1 puff into the lungs 2 (two) times daily.    nitroGLYCERIN (NITROSTAT) 0.4 MG SL tablet Place 0.4 mg under the tongue every 5 (five) minutes as needed for chest pain.               Today   CHIEF COMPLAINT:   Patient doing well this point. Denies shortness of breath or chest pain. Has some minimal wheezing. Ambulating without feeling short of breath.   VITAL SIGNS:  Blood pressure 115/61, pulse 82, temperature 98.2 F (36.8 C), temperature source Oral, resp. rate 22, height 5\' 2"  (1.575 m), weight 92.987 kg (205 lb), SpO2 96 %.   REVIEW OF SYSTEMS:  Review of Systems  Constitutional: Negative for fever, chills and malaise/fatigue.  HENT: Negative for sore throat.   Eyes: Negative for blurred vision.  Respiratory: Negative for cough, hemoptysis, shortness of breath and wheezing.   Cardiovascular: Negative for chest pain, palpitations and leg swelling.  Gastrointestinal: Negative for nausea, vomiting, abdominal pain, diarrhea and blood in stool.  Genitourinary: Negative for dysuria.  Musculoskeletal: Negative for back pain.  Neurological: Negative for dizziness, tremors and headaches.  Endo/Heme/Allergies: Does not bruise/bleed easily.  Psychiatric/Behavioral: Negative.      PHYSICAL EXAMINATION:  GENERAL:  65 y.o.-year-old patient lying in the bed with no acute distress.  NECK:  Supple, no jugular venous distention. No thyroid enlargement, no tenderness.  LUNGS: Bilateral expiratory wheezing very minimal. No rales,rhonchi  No use of accessory muscles of respiration.  CARDIOVASCULAR: S1, S2 normal. No murmurs, rubs, or gallops.  ABDOMEN: Soft, non-tender, non-distended. Bowel sounds present. No organomegaly or mass.  EXTREMITIES: No pedal edema, cyanosis, or clubbing.  PSYCHIATRIC: The patient is alert and oriented x 3.  SKIN: No obvious rash, lesion, or ulcer.   DATA REVIEW:   CBC  Recent Labs Lab 02/27/15 0550  WBC 12.3*  HGB 13.5  HCT 40.6  PLT 232    Chemistries   Recent Labs Lab 02/27/15 0550  NA 138  K 4.5  CL 102  CO2 29  GLUCOSE 150*  BUN 18  CREATININE 0.69  CALCIUM 9.0    Cardiac Enzymes No results for input(s): TROPONINI in the last 168  hours.  Microbiology Results  @MICRORSLT48 @  RADIOLOGY:  Dg Chest 2 View  02/26/2015  CLINICAL DATA:  Dyspnea.  Wheezing.  Shortness of breath.  COPD. EXAM: CHEST  2 VIEW COMPARISON:  01/01/2015 FINDINGS: The heart size and mediastinal contours are within normal limits. Both lungs are clear. The visualized skeletal structures are unremarkable. IMPRESSION: No active cardiopulmonary disease. Electronically Signed   By: Earle Gell M.D.   On: 02/26/2015 19:05      Management plans discussed with the patient and she is in agreement. Stable for discharge home  Patient should follow up with PCP in 1 week  CODE STATUS:     Code Status Orders        Start     Ordered   02/26/15 1620  Full code   Continuous     02/26/15 1620  Code Status History    Date Active Date Inactive Code Status Order ID Comments User Context   12/28/2014  8:45 PM 01/03/2015  3:36 PM Full Code [id number]  Nicholes Mango, MD Inpatient   09/18/2014  3:28 PM 09/22/2014 11:57 AM Full Code [id number]  Bettey Costa, MD Inpatient   05/23/2014  4:02 PM 05/27/2014  4:46 PM Full Code [id number]  Bettey Costa, MD Inpatient      TOTAL TIME TAKING CARE OF THIS PATIENT: 35 minutes.    Note: This dictation was prepared with Dragon dictation along with smaller phrase technology. Any transcriptional errors that result from this process are unintentional.  Harlon Kutner M.D on 02/28/2015 at 10:28 AM  Between 7am to 6pm - Pager - [phone] After 6pm go to [email or web address] - password [email or web address] [facility]  Office  [phone]  CC: Primary care physician; Lorelee Market, MD

## 2015-03-09 ENCOUNTER — Emergency Department: Payer: Medicare Other

## 2015-03-09 ENCOUNTER — Emergency Department
Admission: EM | Admit: 2015-03-09 | Discharge: 2015-03-09 | Disposition: A | Discharge status: Home / Self Care | Payer: Medicare Other | Attending: Emergency Medicine | Admitting: Emergency Medicine

## 2015-03-09 ENCOUNTER — Encounter: Payer: Self-pay | Admitting: Radiology

## 2015-03-09 DIAGNOSIS — Z79899 Other long term (current) drug therapy: Secondary | ICD-10-CM | POA: Insufficient documentation

## 2015-03-09 DIAGNOSIS — Z7952 Long term (current) use of systemic steroids: Secondary | ICD-10-CM | POA: Insufficient documentation

## 2015-03-09 DIAGNOSIS — M542 Cervicalgia: Secondary | ICD-10-CM | POA: Diagnosis not present

## 2015-03-09 DIAGNOSIS — F17210 Nicotine dependence, cigarettes, uncomplicated: Secondary | ICD-10-CM | POA: Diagnosis not present

## 2015-03-09 DIAGNOSIS — I10 Essential (primary) hypertension: Secondary | ICD-10-CM | POA: Diagnosis not present

## 2015-03-09 DIAGNOSIS — Z7951 Long term (current) use of inhaled steroids: Secondary | ICD-10-CM | POA: Diagnosis not present

## 2015-03-09 DIAGNOSIS — Z7902 Long term (current) use of antithrombotics/antiplatelets: Secondary | ICD-10-CM | POA: Diagnosis not present

## 2015-03-09 DIAGNOSIS — J441 Chronic obstructive pulmonary disease with (acute) exacerbation: Secondary | ICD-10-CM | POA: Diagnosis not present

## 2015-03-09 DIAGNOSIS — R0602 Shortness of breath: Secondary | ICD-10-CM | POA: Diagnosis present

## 2015-03-09 LAB — HEPATIC FUNCTION PANEL
ALK PHOS: 83 U/L (ref 38–126)
ALT: 15 U/L (ref 14–54)
AST: 21 U/L (ref 15–41)
Albumin: 3.5 g/dL (ref 3.5–5.0)
BILIRUBIN DIRECT: 0.2 mg/dL (ref 0.1–0.5)
BILIRUBIN INDIRECT: 0.3 mg/dL (ref 0.3–0.9)
Total Bilirubin: 0.5 mg/dL (ref 0.3–1.2)
Total Protein: 6.5 g/dL (ref 6.5–8.1)

## 2015-03-09 LAB — CBC
HEMATOCRIT: 41.4 % (ref 35.0–47.0)
Hemoglobin: 13.8 g/dL (ref 12.0–16.0)
MCH: 31.2 pg (ref 26.0–34.0)
MCHC: 33.3 g/dL (ref 32.0–36.0)
MCV: 93.6 fL (ref 80.0–100.0)
PLATELETS: 206 10*3/uL (ref 150–440)
RBC: 4.42 MIL/uL (ref 3.80–5.20)
RDW: 14.1 % (ref 11.5–14.5)
WBC: 9.5 10*3/uL (ref 3.6–11.0)

## 2015-03-09 LAB — BASIC METABOLIC PANEL
Anion gap: 6 (ref 5–15)
BUN: 12 mg/dL (ref 6–20)
CO2: 27 mmol/L (ref 22–32)
CREATININE: 0.84 mg/dL (ref 0.44–1.00)
Calcium: 8.8 mg/dL — ABNORMAL LOW (ref 8.9–10.3)
Chloride: 106 mmol/L (ref 101–111)
GFR calc Af Amer: >60 mL/min (ref >60)
GLUCOSE: 110 mg/dL — AB (ref 65–99)
POTASSIUM: 4 mmol/L (ref 3.5–5.1)
SODIUM: 139 mmol/L (ref 135–145)

## 2015-03-09 LAB — TROPONIN I: Troponin I: <0.03 ng/mL (ref <0.031)

## 2015-03-09 LAB — BRAIN NATRIURETIC PEPTIDE: B NATRIURETIC PEPTIDE 5: 69 pg/mL (ref 0.0–100.0)

## 2015-03-09 MED ORDER — METHYLPREDNISOLONE SODIUM SUCC 125 MG IJ SOLR
125.0000 mg | Freq: Once | INTRAMUSCULAR | Status: AC
  Administered 2015-03-09: 125 mg via INTRAVENOUS
  Filled 2015-03-09: qty 2

## 2015-03-09 MED ORDER — ALBUTEROL SULFATE (2.5 MG/3ML) 0.083% IN NEBU
5.0000 mg | INHALATION_SOLUTION | Freq: Once | RESPIRATORY_TRACT | Status: AC
  Administered 2015-03-09: 5 mg via RESPIRATORY_TRACT
  Filled 2015-03-09: qty 6

## 2015-03-09 MED ORDER — IPRATROPIUM-ALBUTEROL 0.5-2.5 (3) MG/3ML IN SOLN
3.0000 mL | Freq: Once | RESPIRATORY_TRACT | Status: AC
  Administered 2015-03-09: 3 mL via RESPIRATORY_TRACT
  Filled 2015-03-09: qty 3

## 2015-03-09 MED ORDER — IOHEXOL 350 MG/ML SOLN
75.0000 mL | Freq: Once | INTRAVENOUS | Status: AC | PRN
  Administered 2015-03-09: 75 mL via INTRAVENOUS

## 2015-03-09 NOTE — ED Notes (Signed)
Patient transported to X-ray 

## 2015-03-09 NOTE — ED Notes (Signed)
Pt reports for past couple of days difficulty breathing Pt has COPD on 2L [place]. Audible wheezing. Cough non productive denies fever. Pain in neck and ears.

## 2015-03-09 NOTE — ED Notes (Signed)
Report to Angela, RN

## 2015-03-09 NOTE — ED Provider Notes (Signed)
[facility] Emergency Department Provider Note  ____________________________________________  Time seen: Approximately 11:00 AM  I have reviewed the triage vital signs and the nursing notes.   HISTORY  Chief Complaint Shortness of Breath    HPI Dawn Donaldson is a 65 y.o. female who complains of coughing and shortness of breath starting last night. She has no fever cough is not productive she is wheezing however. She has a history of COPD. She complains of feeling moderately short of breath. This worse with exertion. So reports having and neckache especially on the right side is much worse she's never had chest usually had neck pain.   Past Medical History  Diagnosis Date  . COPD (chronic obstructive pulmonary disease) ([place])     on nocturnal home o2  . Hypertension   . CAD (coronary artery disease)     s/p PCI  . Diastolic CHF ([place])   . Hyperlipidemia   . GERD (gastroesophageal reflux disease)   . Osteoporosis   . Chronic low back pain   . CVA (cerebral infarction)   . DJD (degenerative joint disease)   . Colon polyps     Patient Active Problem List   Diagnosis Date Noted  . COPD exacerbation ([place]) 12/28/2014  . COPD (chronic obstructive pulmonary disease) ([place]) 05/23/2014    Past Surgical History  Procedure Laterality Date  . Carotid endarterectomy      Right  . Cholecystectomy    . Appendectomy    . Back surgery    . Partial hysterectomy      Current Outpatient Rx  Name  Route  Sig  Dispense  Refill  . albuterol (PROVENTIL HFA;VENTOLIN HFA) 108 (90 BASE) MCG/ACT inhaler   Inhalation   Inhale 2 puffs into the lungs every 6 (six) hours as needed for wheezing or shortness of breath.          Marland Kitchen albuterol (PROVENTIL) (2.5 MG/3ML) 0.083% nebulizer solution   Inhalation   Inhale 3 mLs into the lungs every 4 (four) hours as needed for wheezing or shortness of breath.   75 mL   1   . clopidogrel (PLAVIX) 75 MG tablet   Oral   Take  75 mg by mouth daily.          . COMBIVENT RESPIMAT 20-100 MCG/ACT AERS respimat   Inhalation   Inhale 1 puff into the lungs 4 (four) times daily.   1 Inhaler   2     Dispense as written.   . Fluticasone-Salmeterol (ADVAIR) 250-50 MCG/DOSE AEPB   Inhalation   Inhale 1 puff into the lungs 2 (two) times daily.         . furosemide (LASIX) 40 MG tablet   Oral   Take 40 mg by mouth every other day.          . lisinopril (PRINIVIL,ZESTRIL) 20 MG tablet   Oral   Take 20 mg by mouth daily.          Marland Kitchen NEXIUM 40 MG capsule   Oral   Take 40 mg by mouth 2 (two) times daily.            Dispense as written.   . nitroGLYCERIN (NITROSTAT) 0.4 MG SL tablet   Sublingual   Place 0.4 mg under the tongue every 5 (five) minutes as needed for chest pain.          . Oxycodone HCl 10 MG TABS   Oral   Take 1 tablet (10 mg  total) by mouth every 4 (four) hours as needed (for pain.).   20 tablet   0   . predniSONE (DELTASONE) 10 MG tablet   Oral   Take 1 tablet (10 mg total) by mouth daily with breakfast. 60 mg PO (ORAL) x 2 days 50 mg PO x 2 days 40 mg PO x 2 days 30 mg PO x 2 days 20 mg PO x 2 days 10 mg PO x 2 days then stop   42 tablet   0     Label  & dispense according to the schedule below: ...   . simvastatin (ZOCOR) 20 MG tablet   Oral   Take 20 mg by mouth daily as needed (for cholesterol.).            Allergies Dexlansoprazole and Ultram  Family History  Problem Relation Age of Onset  . Atrial fibrillation Mother   . CAD Father     Social History Social History  Substance Use Topics  . Smoking status: Current Every Day Smoker -- 0.10 packs/day    Types: Cigarettes  . Smokeless tobacco: Never Used  . Alcohol Use: No    Review of Systems Constitutional: No fever/chills Eyes: No visual changes. ENT: No sore throat. Cardiovascular: Denies chest pain. Respiratory: See history of present illness Gastrointestinal: No abdominal pain.  No nausea,  no vomiting.  No diarrhea.  No constipation. Genitourinary: Negative for dysuria. Musculoskeletal: Negative for back pain. Skin: Negative for rash. Neurological: Negative for headaches, focal weakness or numbness.  10-point ROS otherwise negative.  ____________________________________________   PHYSICAL EXAM:  VITAL SIGNS: ED Triage Vitals  Enc Vitals Group     BP 03/09/15 0957 186/79 mmHg     Pulse Rate 03/09/15 0957 87     Resp 03/09/15 0957 22     Temp 03/09/15 0957 98.1 F (36.7 C)     Temp Source 03/09/15 0957 Oral     SpO2 03/09/15 0957 99 %     Weight 03/09/15 0957 200 lb (90.719 kg)     Height 03/09/15 0957 5\' 1"  (1.549 m)     Head Cir --      Peak Flow --      Pain Score 03/09/15 1000 7     Pain Loc --      Pain Edu? --      Excl. in [place]? --     Constitutional: Alert and oriented. Well appearing and in no acute distress. Eyes: Conjunctivae are normal. PERRL. EOMI. Head: Atraumatic. Nose: No congestion/rhinnorhea. Mouth/Throat: Mucous membranes are moist.  Oropharynx non-erythematous. Neck: No stridor.   Cardiovascular: Normal rate, regular rhythm. Grossly normal heart sounds.  Good peripheral circulation. Respiratory: Normal respiratory effort.  No retractions. Lungs wheezing Gastrointestinal: Soft and nontender. No distention. No abdominal bruits. No CVA tenderness. Musculoskeletal: No lower extremity tenderness nor edema.  No joint effusions. Neurologic:  Normal speech and language. No gross focal neurologic deficits are appreciated. No gait instability. Skin:  Skin is warm, dry and intact. No rash noted. Psychiatric: Mood and affect are normal. Speech and behavior are normal.  ____________________________________________   LABS (all labs ordered are listed, but only abnormal results are displayed)  Labs Reviewed  BASIC METABOLIC PANEL - Abnormal; Notable for the following:    Glucose, Bld 110 (*)    Calcium 8.8 (*)    All other components within  normal limits  CBC  TROPONIN I  BRAIN NATRIURETIC PEPTIDE  HEPATIC FUNCTION PANEL  TROPONIN I  ____________________________________________  EKG  EKG read and interpreted by me shows normal sinus rhythm rate of 82 normal axis no acute changes     ____________________________________________  RADIOLOGY  Chest x-ray read as hyperinflation only by radiology CTA July the neck shows no explanation for the patient's neck pain. ____________________________________________   PROCEDURES   ____________________________________________   INITIAL IMPRESSION / ASSESSMENT AND PLAN / ED COURSE  Pertinent labs & imaging results that were available during my care of the patient were reviewed by me and considered in my medical decision making (see chart for details).  ____________________________________________   FINAL CLINICAL IMPRESSION(S) / ED DIAGNOSES  Final diagnoses:  COPD exacerbation ([place])   also neck pain of unknown etiology improved in the ER    Nena Polio, MD 03/09/15 1644

## 2015-03-24 ENCOUNTER — Emergency Department: Payer: Medicare Other

## 2015-03-24 ENCOUNTER — Inpatient Hospital Stay
Admission: EM | Admit: 2015-03-24 | Discharge: 2015-03-27 | DRG: 190 | Disposition: A | Discharge status: Home / Self Care | Payer: Medicare Other | Attending: Internal Medicine | Admitting: Internal Medicine

## 2015-03-24 ENCOUNTER — Encounter: Payer: Self-pay | Admitting: *Deleted

## 2015-03-24 DIAGNOSIS — K219 Gastro-esophageal reflux disease without esophagitis: Secondary | ICD-10-CM | POA: Diagnosis present

## 2015-03-24 DIAGNOSIS — Z87891 Personal history of nicotine dependence: Secondary | ICD-10-CM | POA: Diagnosis not present

## 2015-03-24 DIAGNOSIS — M1990 Unspecified osteoarthritis, unspecified site: Secondary | ICD-10-CM | POA: Diagnosis present

## 2015-03-24 DIAGNOSIS — I5032 Chronic diastolic (congestive) heart failure: Secondary | ICD-10-CM | POA: Diagnosis present

## 2015-03-24 DIAGNOSIS — F419 Anxiety disorder, unspecified: Secondary | ICD-10-CM | POA: Diagnosis present

## 2015-03-24 DIAGNOSIS — Z23 Encounter for immunization: Secondary | ICD-10-CM | POA: Diagnosis not present

## 2015-03-24 DIAGNOSIS — Z79899 Other long term (current) drug therapy: Secondary | ICD-10-CM | POA: Diagnosis not present

## 2015-03-24 DIAGNOSIS — G8929 Other chronic pain: Secondary | ICD-10-CM | POA: Diagnosis present

## 2015-03-24 DIAGNOSIS — J209 Acute bronchitis, unspecified: Secondary | ICD-10-CM | POA: Diagnosis present

## 2015-03-24 DIAGNOSIS — J440 Chronic obstructive pulmonary disease with acute lower respiratory infection: Secondary | ICD-10-CM | POA: Diagnosis present

## 2015-03-24 DIAGNOSIS — I2510 Atherosclerotic heart disease of native coronary artery without angina pectoris: Secondary | ICD-10-CM | POA: Diagnosis present

## 2015-03-24 DIAGNOSIS — Z7902 Long term (current) use of antithrombotics/antiplatelets: Secondary | ICD-10-CM

## 2015-03-24 DIAGNOSIS — Z7951 Long term (current) use of inhaled steroids: Secondary | ICD-10-CM | POA: Diagnosis not present

## 2015-03-24 DIAGNOSIS — M545 Low back pain: Secondary | ICD-10-CM | POA: Diagnosis present

## 2015-03-24 DIAGNOSIS — J441 Chronic obstructive pulmonary disease with (acute) exacerbation: Secondary | ICD-10-CM | POA: Diagnosis not present

## 2015-03-24 DIAGNOSIS — I110 Hypertensive heart disease with heart failure: Secondary | ICD-10-CM | POA: Diagnosis present

## 2015-03-24 DIAGNOSIS — M810 Age-related osteoporosis without current pathological fracture: Secondary | ICD-10-CM | POA: Diagnosis present

## 2015-03-24 DIAGNOSIS — Z888 Allergy status to other drugs, medicaments and biological substances status: Secondary | ICD-10-CM

## 2015-03-24 DIAGNOSIS — Z86010 Personal history of colonic polyps: Secondary | ICD-10-CM

## 2015-03-24 DIAGNOSIS — Z7952 Long term (current) use of systemic steroids: Secondary | ICD-10-CM

## 2015-03-24 DIAGNOSIS — Z9981 Dependence on supplemental oxygen: Secondary | ICD-10-CM | POA: Diagnosis not present

## 2015-03-24 DIAGNOSIS — Z8673 Personal history of transient ischemic attack (TIA), and cerebral infarction without residual deficits: Secondary | ICD-10-CM

## 2015-03-24 DIAGNOSIS — J9620 Acute and chronic respiratory failure, unspecified whether with hypoxia or hypercapnia: Secondary | ICD-10-CM | POA: Diagnosis present

## 2015-03-24 DIAGNOSIS — F17210 Nicotine dependence, cigarettes, uncomplicated: Secondary | ICD-10-CM | POA: Diagnosis present

## 2015-03-24 DIAGNOSIS — E785 Hyperlipidemia, unspecified: Secondary | ICD-10-CM | POA: Diagnosis present

## 2015-03-24 LAB — CBC WITH DIFFERENTIAL/PLATELET
BASOS ABS: 0.1 10*3/uL (ref 0–0.1)
Basophils Relative: 1 %
EOS ABS: 0.4 10*3/uL (ref 0–0.7)
EOS PCT: 4 %
HCT: 41.1 % (ref 35.0–47.0)
HEMOGLOBIN: 13.6 g/dL (ref 12.0–16.0)
LYMPHS ABS: 4 10*3/uL — AB (ref 1.0–3.6)
LYMPHS PCT: 38 %
MCH: 31.2 pg (ref 26.0–34.0)
MCHC: 33.2 g/dL (ref 32.0–36.0)
MCV: 94 fL (ref 80.0–100.0)
Monocytes Absolute: 1.1 10*3/uL — ABNORMAL HIGH (ref 0.2–0.9)
Monocytes Relative: 11 %
NEUTROS PCT: 46 %
Neutro Abs: 4.9 10*3/uL (ref 1.4–6.5)
PLATELETS: 265 10*3/uL (ref 150–440)
RBC: 4.37 MIL/uL (ref 3.80–5.20)
RDW: 14.3 % (ref 11.5–14.5)
WBC: 10.5 10*3/uL (ref 3.6–11.0)

## 2015-03-24 LAB — COMPREHENSIVE METABOLIC PANEL
ALT: 14 U/L (ref 14–54)
AST: 20 U/L (ref 15–41)
Albumin: 3.8 g/dL (ref 3.5–5.0)
Alkaline Phosphatase: 92 U/L (ref 38–126)
Anion gap: 3 — ABNORMAL LOW (ref 5–15)
BUN: 15 mg/dL (ref 6–20)
CHLORIDE: 107 mmol/L (ref 101–111)
CO2: 28 mmol/L (ref 22–32)
Calcium: 9.1 mg/dL (ref 8.9–10.3)
Creatinine, Ser: 0.73 mg/dL (ref 0.44–1.00)
GFR calc Af Amer: >60 mL/min (ref >60)
GFR calc non Af Amer: >60 mL/min (ref >60)
Glucose, Bld: 91 mg/dL (ref 65–99)
Potassium: 3.7 mmol/L (ref 3.5–5.1)
SODIUM: 138 mmol/L (ref 135–145)
TOTAL PROTEIN: 7 g/dL (ref 6.5–8.1)
Total Bilirubin: 0.6 mg/dL (ref 0.3–1.2)

## 2015-03-24 LAB — BRAIN NATRIURETIC PEPTIDE: B NATRIURETIC PEPTIDE 5: 58 pg/mL (ref 0.0–100.0)

## 2015-03-24 MED ORDER — PANTOPRAZOLE SODIUM 40 MG PO TBEC
40.0000 mg | DELAYED_RELEASE_TABLET | Freq: Two times a day (BID) | ORAL | Status: DC
Start: 2015-03-24 — End: 2015-03-27
  Administered 2015-03-24 – 2015-03-27 (×6): 40 mg via ORAL
  Filled 2015-03-24 (×6): qty 1

## 2015-03-24 MED ORDER — HYDRALAZINE HCL 20 MG/ML IJ SOLN: 10.0000 mg | Freq: Four times a day (QID) | INTRAMUSCULAR | Status: DC | PRN

## 2015-03-24 MED ORDER — POLYETHYLENE GLYCOL 3350 17 G PO PACK: 17.0000 g | PACK | Freq: Every day | ORAL | Status: DC | PRN

## 2015-03-24 MED ORDER — FUROSEMIDE 40 MG PO TABS
40.0000 mg | ORAL_TABLET | ORAL | Status: DC
  Administered 2015-03-24 – 2015-03-26 (×2): 40 mg via ORAL
  Filled 2015-03-24 (×2): qty 1

## 2015-03-24 MED ORDER — SODIUM CHLORIDE 0.9 % IV SOLN: 250.0000 mL | INTRAVENOUS | Status: DC | PRN

## 2015-03-24 MED ORDER — AZITHROMYCIN 250 MG PO TABS: 500.0000 mg | ORAL_TABLET | Freq: Every day | ORAL | Status: DC

## 2015-03-24 MED ORDER — MOMETASONE FURO-FORMOTEROL FUM 200-5 MCG/ACT IN AERO
2.0000 | INHALATION_SPRAY | Freq: Two times a day (BID) | RESPIRATORY_TRACT | Status: DC
  Administered 2015-03-24 – 2015-03-25 (×2): 2 via RESPIRATORY_TRACT
  Filled 2015-03-24 (×2): qty 8.8

## 2015-03-24 MED ORDER — LISINOPRIL 20 MG PO TABS: 20.0000 mg | ORAL_TABLET | Freq: Every day | ORAL | Status: DC

## 2015-03-24 MED ORDER — BISACODYL 5 MG PO TBEC
5.0000 mg | DELAYED_RELEASE_TABLET | Freq: Every day | ORAL | Status: DC | PRN
  Filled 2015-03-24: qty 1

## 2015-03-24 MED ORDER — METHYLPREDNISOLONE SODIUM SUCC 125 MG IJ SOLR
125.0000 mg | Freq: Once | INTRAMUSCULAR | Status: AC
  Administered 2015-03-24: 125 mg via INTRAVENOUS

## 2015-03-24 MED ORDER — METHYLPREDNISOLONE SODIUM SUCC 125 MG IJ SOLR
INTRAMUSCULAR | Status: AC
  Administered 2015-03-24: 125 mg via INTRAVENOUS
  Filled 2015-03-24: qty 2

## 2015-03-24 MED ORDER — IPRATROPIUM-ALBUTEROL 0.5-2.5 (3) MG/3ML IN SOLN
3.0000 mL | Freq: Once | RESPIRATORY_TRACT | Status: AC
  Administered 2015-03-24: 3 mL via RESPIRATORY_TRACT
  Filled 2015-03-24: qty 3

## 2015-03-24 MED ORDER — AZITHROMYCIN 250 MG PO TABS
500.0000 mg | ORAL_TABLET | Freq: Every day | ORAL | Status: DC
  Administered 2015-03-24 – 2015-03-26 (×3): 500 mg via ORAL
  Filled 2015-03-24 (×3): qty 2

## 2015-03-24 MED ORDER — SODIUM CHLORIDE 0.9% FLUSH
3.0000 mL | Freq: Two times a day (BID) | INTRAVENOUS | Status: DC
  Administered 2015-03-24 – 2015-03-27 (×6): 3 mL via INTRAVENOUS

## 2015-03-24 MED ORDER — CLOPIDOGREL BISULFATE 75 MG PO TABS
75.0000 mg | ORAL_TABLET | Freq: Every day | ORAL | Status: DC
  Administered 2015-03-25 – 2015-03-27 (×3): 75 mg via ORAL
  Filled 2015-03-24 (×3): qty 1

## 2015-03-24 MED ORDER — SIMVASTATIN 20 MG PO TABS
20.0000 mg | ORAL_TABLET | Freq: Every day | ORAL | Status: DC
  Administered 2015-03-24 – 2015-03-26 (×3): 20 mg via ORAL
  Filled 2015-03-24 (×3): qty 1

## 2015-03-24 MED ORDER — INFLUENZA VAC SPLIT QUAD 0.5 ML IM SUSY
0.5000 mL | PREFILLED_SYRINGE | INTRAMUSCULAR | Status: AC
  Administered 2015-03-25: 0.5 mL via INTRAMUSCULAR
  Filled 2015-03-24: qty 0.5

## 2015-03-24 MED ORDER — OXYCODONE HCL 5 MG PO TABS
10.0000 mg | ORAL_TABLET | ORAL | Status: DC | PRN
  Administered 2015-03-24 – 2015-03-26 (×7): 10 mg via ORAL
  Filled 2015-03-24 (×7): qty 2

## 2015-03-24 MED ORDER — MAGNESIUM SULFATE 2 GM/50ML IV SOLN
2.0000 g | Freq: Once | INTRAVENOUS | Status: AC
  Administered 2015-03-24: 2 g via INTRAVENOUS

## 2015-03-24 MED ORDER — ENOXAPARIN SODIUM 40 MG/0.4ML ~~LOC~~ SOLN
40.0000 mg | SUBCUTANEOUS | Status: DC
  Administered 2015-03-24 – 2015-03-26 (×3): 40 mg via SUBCUTANEOUS
  Filled 2015-03-24 (×3): qty 0.4

## 2015-03-24 MED ORDER — FUROSEMIDE 40 MG PO TABS: 40.0000 mg | ORAL_TABLET | ORAL | Status: DC

## 2015-03-24 MED ORDER — SODIUM CHLORIDE 0.9% FLUSH: 3.0000 mL | INTRAVENOUS | Status: DC | PRN

## 2015-03-24 MED ORDER — ALBUTEROL SULFATE (2.5 MG/3ML) 0.083% IN NEBU
INHALATION_SOLUTION | RESPIRATORY_TRACT | Status: AC
  Filled 2015-03-24: qty 3

## 2015-03-24 MED ORDER — MAGNESIUM SULFATE 4 GM/100ML IV SOLN
INTRAVENOUS | Status: AC
  Administered 2015-03-24: 2 g via INTRAVENOUS
  Filled 2015-03-24: qty 100

## 2015-03-24 MED ORDER — NITROGLYCERIN 0.4 MG SL SUBL: 0.4000 mg | SUBLINGUAL_TABLET | SUBLINGUAL | Status: DC | PRN

## 2015-03-24 MED ORDER — ALBUTEROL SULFATE (2.5 MG/3ML) 0.083% IN NEBU
2.5000 mg | INHALATION_SOLUTION | Freq: Once | RESPIRATORY_TRACT | Status: AC
  Administered 2015-03-24: 2.5 mg via RESPIRATORY_TRACT
  Filled 2015-03-24: qty 3

## 2015-03-24 MED ORDER — ACETAMINOPHEN 325 MG PO TABS: 650.0000 mg | ORAL_TABLET | Freq: Four times a day (QID) | ORAL | Status: DC | PRN

## 2015-03-24 MED ORDER — IPRATROPIUM-ALBUTEROL 0.5-2.5 (3) MG/3ML IN SOLN
3.0000 mL | RESPIRATORY_TRACT | Status: DC
  Administered 2015-03-24 – 2015-03-25 (×4): 3 mL via RESPIRATORY_TRACT
  Filled 2015-03-24 (×4): qty 3

## 2015-03-24 MED ORDER — LISINOPRIL 20 MG PO TABS
20.0000 mg | ORAL_TABLET | Freq: Every day | ORAL | Status: DC
  Administered 2015-03-25 – 2015-03-27 (×3): 20 mg via ORAL
  Filled 2015-03-24 (×3): qty 1

## 2015-03-24 MED ORDER — ACETAMINOPHEN 650 MG RE SUPP
650.0000 mg | Freq: Four times a day (QID) | RECTAL | Status: DC | PRN
Start: 2015-03-24 — End: 2015-03-27

## 2015-03-24 MED ORDER — ONDANSETRON HCL 4 MG PO TABS: 4.0000 mg | ORAL_TABLET | Freq: Four times a day (QID) | ORAL | Status: DC | PRN

## 2015-03-24 MED ORDER — METHYLPREDNISOLONE SODIUM SUCC 125 MG IJ SOLR
60.0000 mg | Freq: Two times a day (BID) | INTRAMUSCULAR | Status: DC
  Administered 2015-03-24 – 2015-03-25 (×3): 60 mg via INTRAVENOUS
  Filled 2015-03-24 (×3): qty 2

## 2015-03-24 MED ORDER — HYDROCOD POLST-CPM POLST ER 10-8 MG/5ML PO SUER
5.0000 mL | Freq: Two times a day (BID) | ORAL | Status: DC | PRN
  Administered 2015-03-24 – 2015-03-27 (×5): 5 mL via ORAL
  Filled 2015-03-24 (×5): qty 5

## 2015-03-24 MED ORDER — ONDANSETRON HCL 4 MG/2ML IJ SOLN
4.0000 mg | Freq: Four times a day (QID) | INTRAMUSCULAR | Status: DC | PRN
  Administered 2015-03-25 – 2015-03-27 (×3): 4 mg via INTRAVENOUS
  Filled 2015-03-24 (×3): qty 2

## 2015-03-24 NOTE — Progress Notes (Signed)
Spoke with DR. Oseni about  Pt cough. Pt is requesting tussinex. May have tussinex Q12 prn.

## 2015-03-24 NOTE — ED Notes (Signed)
Patient transported to X-ray 

## 2015-03-24 NOTE — ED Notes (Signed)
Patient c/o increased shortness of breath since 0300 this a.m. Patient has a history of COPD. Patient had to turn O2 up to 4L and normally is on 2L. Patient has increased work of breathing in triage. Patient states she just finished antibiotic and prednisone in the last week.

## 2015-03-24 NOTE — H&P (Signed)
[facility] NAME: Dawn Donaldson    MR#:  [id number]  DATE OF BIRTH:  Nov 20, 1950  DATE OF ADMISSION:  03/24/2015  PRIMARY CARE PHYSICIAN: Lorelee Market, MD   REQUESTING/REFERRING PHYSICIAN: Dr. Corky Downs  CHIEF COMPLAINT:   Chief Complaint  Patient presents with  . Shortness of Breath    HISTORY OF PRESENT ILLNESS:  Dawn Donaldson  is a 65 y.o. female with a known history of COPD, diastolic CHF, hypertension, tobacco abuse presents to the emergency room complaining of worsening shortness of breath over the last few days. Patient was seen by her primary care physician 10 days back and started on prednisone and antibiotics. She has finished these medications 5 days back. After this her breathing slowly got worse and she decided to present to the emergency room. Patient woke up at 3 AM today tried 2 rounds of DuoNeb with no improvement. She did get 2 nebulizer therapy with albuterol and wondered DuoNeb here with no improvement along with IV steroids. Patient has chronic orthopnea which is unchanged. She does have lower extremity edema which is improved. No sick contacts. Afebrile. Chest x-ray shows changes from COPD.  PAST MEDICAL HISTORY:   Past Medical History  Diagnosis Date  . COPD (chronic obstructive pulmonary disease) ([place])     on nocturnal home o2  . Hypertension   . CAD (coronary artery disease)     s/p PCI  . Diastolic CHF ([place])   . Hyperlipidemia   . GERD (gastroesophageal reflux disease)   . Osteoporosis   . Chronic low back pain   . CVA (cerebral infarction)   . DJD (degenerative joint disease)   . Colon polyps     PAST SURGICAL HISTORY:   Past Surgical History  Procedure Laterality Date  . Carotid endarterectomy      Right  . Cholecystectomy    . Appendectomy    . Back surgery    . Partial hysterectomy      SOCIAL HISTORY:   Social History  Substance Use Topics  . Smoking status: Former Smoker --  0.10 packs/day    Types: Cigarettes  . Smokeless tobacco: Never Used  . Alcohol Use: No    FAMILY HISTORY:   Family History  Problem Relation Age of Onset  . Atrial fibrillation Mother   . CAD Father     DRUG ALLERGIES:   Allergies  Allergen Reactions  . Dexlansoprazole Nausea And Vomiting  . Ultram [Tramadol] Itching    REVIEW OF SYSTEMS:   Review of Systems  Constitutional: Positive for malaise/fatigue. Negative for fever, chills and weight loss.  HENT: Negative for hearing loss and nosebleeds.   Eyes: Negative for blurred vision, double vision and pain.  Respiratory: Positive for cough, sputum production, shortness of breath and wheezing. Negative for hemoptysis.   Cardiovascular: Positive for leg swelling. Negative for chest pain, palpitations and orthopnea.  Gastrointestinal: Positive for nausea. Negative for vomiting, abdominal pain, diarrhea and constipation.  Genitourinary: Negative for dysuria and hematuria.  Musculoskeletal: Negative for myalgias, back pain and falls.  Skin: Negative for rash.  Neurological: Positive for weakness. Negative for dizziness, tremors, sensory change, speech change, focal weakness, seizures and headaches.  Endo/Heme/Allergies: Does not bruise/bleed easily.  Psychiatric/Behavioral: Negative for depression and memory loss. The patient is not nervous/anxious.     MEDICATIONS AT HOME:   Prior to Admission medications   Medication Sig Start Date End Date Taking? Authorizing Provider  acetaminophen (TYLENOL) 500  MG tablet Take 1,000 mg by mouth every 6 (six) hours as needed for mild pain or headache.   Yes Historical Provider, MD  albuterol (PROVENTIL HFA;VENTOLIN HFA) 108 (90 BASE) MCG/ACT inhaler Inhale 2 puffs into the lungs every 6 (six) hours as needed for wheezing or shortness of breath.    Yes Historical Provider, MD  clopidogrel (PLAVIX) 75 MG tablet Take 75 mg by mouth daily.    Yes Historical Provider, MD  COMBIVENT RESPIMAT  20-100 MCG/ACT AERS respimat Inhale 1 puff into the lungs 4 (four) times daily. 05/27/14  Yes Gladstone Lighter, MD  esomeprazole (NEXIUM) 40 MG capsule Take 40 mg by mouth 2 (two) times daily.   Yes Historical Provider, MD  Fluticasone-Salmeterol (ADVAIR) 250-50 MCG/DOSE AEPB Inhale 1 puff into the lungs 2 (two) times daily.   Yes Historical Provider, MD  furosemide (LASIX) 40 MG tablet Take 40 mg by mouth every other day.    Yes Historical Provider, MD  ipratropium-albuterol (DUONEB) 0.5-2.5 (3) MG/3ML SOLN Take 3 mLs by nebulization every 4 (four) hours as needed (for wheezing or shortness of breath).   Yes Historical Provider, MD  lisinopril (PRINIVIL,ZESTRIL) 20 MG tablet Take 20 mg by mouth daily.    Yes Historical Provider, MD  nitroGLYCERIN (NITROSTAT) 0.4 MG SL tablet Place 0.4 mg under the tongue every 5 (five) minutes as needed for chest pain.    Yes Historical Provider, MD  Oxycodone HCl 10 MG TABS Take 1 tablet (10 mg total) by mouth every 4 (four) hours as needed (for pain.). 02/28/15  Yes Bettey Costa, MD  simvastatin (ZOCOR) 20 MG tablet Take 20 mg by mouth at bedtime.    Yes Historical Provider, MD  predniSONE (DELTASONE) 10 MG tablet Take 1 tablet (10 mg total) by mouth daily with breakfast. 60 mg PO (ORAL) x 2 days 50 mg PO x 2 days 40 mg PO x 2 days 30 mg PO x 2 days 20 mg PO x 2 days 10 mg PO x 2 days then stop Patient not taking: Reported on 03/24/2015 02/28/15   Bettey Costa, MD     VITAL SIGNS:  Blood pressure 163/82, pulse 84, temperature 98.2 F (36.8 C), temperature source Oral, resp. rate 22, height 5\' 1"  (1.549 m), weight 90.719 kg (200 lb), SpO2 100 %.  PHYSICAL EXAMINATION:  Physical Exam  GENERAL:  65 y.o.-year-old patient lying in the bed with Respiratory distress. EYES: Pupils equal, round, reactive to light and accommodation. No scleral icterus. Extraocular muscles intact.  HEENT: Head atraumatic, normocephalic. Oropharynx and nasopharynx clear. No oropharyngeal  erythema, moist oral mucosa  NECK:  Supple, no jugular venous distention. No thyroid enlargement, no tenderness.  LUNGS: Has 1-2 word conversational dyspnea with audible wheezing. Bilateral wheezing on auscultation. Poor air entry on both sides. CARDIOVASCULAR: S1, S2 normal. No murmurs, rubs, or gallops.  ABDOMEN: Soft, nontender, nondistended. Bowel sounds present. No organomegaly or mass.  EXTREMITIES: No pedal edema, cyanosis, or clubbing. + 2 pedal & radial pulses b/l.   NEUROLOGIC: Cranial nerves II through XII are intact. No focal Motor or sensory deficits appreciated b/l. PSYCHIATRIC: The patient is alert and oriented x 3. Good affect.  SKIN: No obvious rash, lesion, or ulcer.   LABORATORY PANEL:   CBC  Recent Labs Lab 03/24/15 1254  WBC 10.5  HGB 13.6  HCT 41.1  PLT 265   ------------------------------------------------------------------------------------------------------------------  Chemistries   Recent Labs Lab 03/24/15 1254  NA 138  K 3.7  CL 107  CO2  28  GLUCOSE 91  BUN 15  CREATININE 0.73  CALCIUM 9.1  AST 20  ALT 14  ALKPHOS 92  BILITOT 0.6   ------------------------------------------------------------------------------------------------------------------  Cardiac Enzymes No results for input(s): TROPONINI in the last 168 hours. ------------------------------------------------------------------------------------------------------------------  RADIOLOGY:  Dg Chest 2 View  03/24/2015  CLINICAL DATA:  65 year old female with shortness of breath this morning (awoken at 3 a.m. with acute symptoms). Right smoker. History of COPD. Prior history of myocardial infarction. EXAM: CHEST  2 VIEW COMPARISON:  Chest x-ray 03/09/2015. FINDINGS: Lung volumes are normal. No consolidative airspace disease. No pleural effusions. No pneumothorax. No pulmonary nodule or mass noted. Pulmonary vasculature and the cardiomediastinal silhouette are within normal limits.  Atherosclerosis in the thoracic aorta. IMPRESSION: 1. No radiographic evidence of acute cardiopulmonary disease. 2. Atherosclerosis. Electronically Signed   By: Vinnie Langton M.D.   On: 03/24/2015 14:02     IMPRESSION AND PLAN:   * Acute COPD exacerbation with acute on chronic respiratory failure -IV steroids, Antibiotics. Azithromycin - Scheduled Nebulizers - Inhalers -Wean O2 as tolerated - Consult pulmonary if no improvement  * Hypertension, uncontrolled We'll give patient home medications first. If uncontrolled added IV hydralazine when necessary.  * Chronic diastolic CHF No pulmonary edema on chest x-ray. We'll continue home dose of Lasix.  * DVT prophylaxis with Lovenox   All the records are reviewed and case discussed with ED provider. Management plans discussed with the patient, family and they are in agreement.  CODE STATUS: FULL  TOTAL TIME TAKING CARE OF THIS PATIENT: 45 minutes.   Hillary Bow R M.D on 03/24/2015 at 3:50 PM  Between 7am to 6pm - Pager - [phone]  After 6pm go to [email or web address] - password [email or web address] [facility]  Office  [phone]  CC: Primary care physician; Lorelee Market, MD  Note: This dictation was prepared with Dragon dictation along with smaller phrase technology. Any transcriptional errors that result from this process are unintentional.

## 2015-03-24 NOTE — ED Notes (Signed)
Pt reports breathing feels better.  Pt not labored at this time at rest but remains to have coarse inspiratory and expiratory wheezes.

## 2015-03-24 NOTE — ED Provider Notes (Signed)
[facility] Emergency Department Provider Note  ____________________________________________    I have reviewed the triage vital signs and the nursing notes.   HISTORY  Chief Complaint Shortness of Breath    HPI Dawn Donaldson is a 65 y.o. female who presents with shortness of breath. She reports became acutely worse around 3 AM. She has a history of significant COPD and she does use oxygen at 2 L. She had to increase her oxygen but was still markedly short of breath so she came to the emergency department. She denies fevers or chills. No recent travel, no calf pain or swelling     Past Medical History  Diagnosis Date  . COPD (chronic obstructive pulmonary disease) ([place])     on nocturnal home o2  . Hypertension   . CAD (coronary artery disease)     s/p PCI  . Diastolic CHF ([place])   . Hyperlipidemia   . GERD (gastroesophageal reflux disease)   . Osteoporosis   . Chronic low back pain   . CVA (cerebral infarction)   . DJD (degenerative joint disease)   . Colon polyps     Patient Active Problem List   Diagnosis Date Noted  . COPD exacerbation ([place]) 12/28/2014  . COPD (chronic obstructive pulmonary disease) ([place]) 05/23/2014    Past Surgical History  Procedure Laterality Date  . Carotid endarterectomy      Right  . Cholecystectomy    . Appendectomy    . Back surgery    . Partial hysterectomy      Current Outpatient Rx  Name  Route  Sig  Dispense  Refill  . albuterol (PROVENTIL HFA;VENTOLIN HFA) 108 (90 BASE) MCG/ACT inhaler   Inhalation   Inhale 2 puffs into the lungs every 6 (six) hours as needed for wheezing or shortness of breath.          Marland Kitchen albuterol (PROVENTIL) (2.5 MG/3ML) 0.083% nebulizer solution   Inhalation   Inhale 3 mLs into the lungs every 4 (four) hours as needed for wheezing or shortness of breath.   75 mL   1   . clopidogrel (PLAVIX) 75 MG tablet   Oral   Take 75 mg by mouth daily.          . COMBIVENT  RESPIMAT 20-100 MCG/ACT AERS respimat   Inhalation   Inhale 1 puff into the lungs 4 (four) times daily.   1 Inhaler   2     Dispense as written.   Marland Kitchen esomeprazole (NEXIUM) 40 MG capsule   Oral   Take 40 mg by mouth 2 (two) times daily.         . Fluticasone-Salmeterol (ADVAIR) 250-50 MCG/DOSE AEPB   Inhalation   Inhale 1 puff into the lungs 2 (two) times daily.         . furosemide (LASIX) 40 MG tablet   Oral   Take 40 mg by mouth every other day.          . lisinopril (PRINIVIL,ZESTRIL) 20 MG tablet   Oral   Take 20 mg by mouth daily.          . nitroGLYCERIN (NITROSTAT) 0.4 MG SL tablet   Sublingual   Place 0.4 mg under the tongue every 5 (five) minutes as needed for chest pain.          . Oxycodone HCl 10 MG TABS   Oral   Take 1 tablet (10 mg total) by mouth every 4 (four) hours as needed (  for pain.).   20 tablet   0   . predniSONE (DELTASONE) 10 MG tablet   Oral   Take 1 tablet (10 mg total) by mouth daily with breakfast. 60 mg PO (ORAL) x 2 days 50 mg PO x 2 days 40 mg PO x 2 days 30 mg PO x 2 days 20 mg PO x 2 days 10 mg PO x 2 days then stop   42 tablet   0     Label  & dispense according to the schedule below: ...   . simvastatin (ZOCOR) 20 MG tablet   Oral   Take 20 mg by mouth daily as needed (for cholesterol.).            Allergies Dexlansoprazole and Ultram  Family History  Problem Relation Age of Onset  . Atrial fibrillation Mother   . CAD Father     Social History Social History  Substance Use Topics  . Smoking status: Former Smoker -- 0.10 packs/day    Types: Cigarettes  . Smokeless tobacco: Never Used  . Alcohol Use: No    Review of Systems  Constitutional: Negative for fever. Eyes: Negative for redness ENT: Negative for sore throat Cardiovascular: Chest tightness Respiratory: As above Gastrointestinal: Negative for abdominal pain Genitourinary: Negative for dysuria. Musculoskeletal: Negative for back  pain. Skin: Negative for rash. Neurological: Negative for focal weakness Psychiatric: no anxiety    ____________________________________________   PHYSICAL EXAM:  VITAL SIGNS: ED Triage Vitals  Enc Vitals Group     BP 03/24/15 1228 187/89 mmHg     Pulse Rate 03/24/15 1228 89     Resp 03/24/15 1228 22     Temp 03/24/15 1228 98.2 F (36.8 C)     Temp Source 03/24/15 1228 Oral     SpO2 03/24/15 1228 100 %     Weight 03/24/15 1228 200 lb (90.719 kg)     Height 03/24/15 1228 5\' 1"  (1.549 m)     Head Cir --      Peak Flow --      Pain Score --      Pain Loc --      Pain Edu? --      Excl. in [place]? --      Constitutional: Alert and oriented. Well appearing and in no distress.  Eyes: Conjunctivae are normal. No erythema or injection ENT   Head: Normocephalic and atraumatic.   Mouth/Throat: Mucous membranes are moist. Cardiovascular: Normal rate, regular rhythm. Normal and symmetric distal pulses are present in the upper extremities. Respiratory: Tachypnea and increased work of breathing. Wheezing diffusely Gastrointestinal: Soft and non-tender in all quadrants. No distention. There is no CVA tenderness. Genitourinary: deferred Musculoskeletal: Nontender with normal range of motion in all extremities. No lower extremity tenderness nor edema. Neurologic:  Normal speech and language. No gross focal neurologic deficits are appreciated. Skin:  Skin is warm, dry and intact. No rash noted. Psychiatric: Mood and affect are normal. Patient exhibits appropriate insight and judgment.  ____________________________________________    LABS (pertinent positives/negatives)  Labs Reviewed  COMPREHENSIVE METABOLIC PANEL - Abnormal; Notable for the following:    Anion gap 3 (*)    All other components within normal limits  CBC WITH DIFFERENTIAL/PLATELET - Abnormal; Notable for the following:    Lymphs Abs 4.0 (*)    Monocytes Absolute 1.1 (*)    All other components within normal  limits  BRAIN NATRIURETIC PEPTIDE  TROPONIN I    ____________________________________________   EKG  ED ECG REPORT I, Lavonia Drafts, the attending physician, personally viewed and interpreted this ECG.  Date: 03/24/2015 EKG Time: 12:40 AM Rate: 89 Rhythm: normal sinus rhythm QRS Axis: normal Intervals: normal ST/T Wave abnormalities: normal Conduction Disturbances: none Narrative Interpretation: unremarkable   ____________________________________________    RADIOLOGY  Chest x-ray unremarkable  ____________________________________________   PROCEDURES  Procedure(s) performed: none  Critical Care performed: none  ____________________________________________   INITIAL IMPRESSION / ASSESSMENT AND PLAN / ED COURSE  Pertinent labs & imaging results that were available during my care of the patient were reviewed by me and considered in my medical decision making (see chart for details).  Patient with significant increased work of breathing, Solu-Medrol 125, 2 g of magnesium started immediately. Given nebs 3.  ----------------------------------------- 3:06 PM on 03/24/2015 -----------------------------------------  Patient improved with still significant wheezing. She will require admission.  ____________________________________________   FINAL CLINICAL IMPRESSION(S) / ED DIAGNOSES  Final diagnoses:  COPD exacerbation ([place])          Lavonia Drafts, MD 03/24/15 1526

## 2015-03-25 DIAGNOSIS — J441 Chronic obstructive pulmonary disease with (acute) exacerbation: Secondary | ICD-10-CM

## 2015-03-25 DIAGNOSIS — J440 Chronic obstructive pulmonary disease with acute lower respiratory infection: Secondary | ICD-10-CM | POA: Diagnosis not present

## 2015-03-25 MED ORDER — BUDESONIDE 0.5 MG/2ML IN SUSP
0.5000 mg | Freq: Two times a day (BID) | RESPIRATORY_TRACT | Status: DC
  Administered 2015-03-25 – 2015-03-27 (×4): 0.5 mg via RESPIRATORY_TRACT
  Filled 2015-03-25 (×4): qty 2

## 2015-03-25 MED ORDER — IPRATROPIUM-ALBUTEROL 0.5-2.5 (3) MG/3ML IN SOLN
3.0000 mL | RESPIRATORY_TRACT | Status: DC
  Administered 2015-03-25 – 2015-03-27 (×12): 3 mL via RESPIRATORY_TRACT
  Filled 2015-03-25 (×13): qty 3

## 2015-03-25 MED ORDER — MOMETASONE FURO-FORMOTEROL FUM 100-5 MCG/ACT IN AERO
2.0000 | INHALATION_SPRAY | Freq: Two times a day (BID) | RESPIRATORY_TRACT | Status: DC
  Administered 2015-03-25 – 2015-03-27 (×4): 2 via RESPIRATORY_TRACT
  Filled 2015-03-25: qty 8.8

## 2015-03-25 MED ORDER — TIOTROPIUM BROMIDE MONOHYDRATE 18 MCG IN CAPS
18.0000 ug | ORAL_CAPSULE | Freq: Every day | RESPIRATORY_TRACT | Status: DC
  Administered 2015-03-25 – 2015-03-27 (×3): 18 ug via RESPIRATORY_TRACT
  Filled 2015-03-25: qty 5

## 2015-03-25 NOTE — Consult Note (Signed)
[facility] Consultation      Date: 03/25/2015,   MRN# [id number] Justeen Sumerlin July 29, 1950 Code Status:     Code Status Orders        Start     Ordered   03/24/15 1534  Full code   Continuous     03/24/15 1534    Code Status History    Date Active Date Inactive Code Status Order ID Comments User Context   02/26/2015  4:20 PM 02/28/2015  3:30 PM Full Code [id number]  Gladstone Lighter, MD Inpatient   12/28/2014  8:45 PM 01/03/2015  3:36 PM Full Code [id number]  Nicholes Mango, MD Inpatient   09/18/2014  3:28 PM 09/22/2014 11:57 AM Full Code [id number]  Bettey Costa, MD Inpatient   05/23/2014  4:02 PM 05/27/2014  4:46 PM Full Code [id number]  Bettey Costa, MD Inpatient     Hosp day:@LENGTHOFSTAYDAYS @ Referring MD: @ATDPROV @     PCP:      AdmissionWeight: 200 lb (90.719 kg)                 CurrentWeight: 207 lb 8.5 oz (94.136 kg) Kerissa Felkins is a 65 y.o. old female seen in consultation for COPD at the request of Dr. Manuella Ghazi: patient seen for Dr. Raul Del   Patient   CHIEF COMPLAINT:   Acute resp distress   HISTORY OF PRESENT ILLNESS   65 yo obese white female admitted for increased WOB, increased SOB  With really bad wheezing complaining of worsening shortness of breath over the last few days.  -Patient was seen by her primary care physician 10 days back and started on prednisone and antibiotics.  -After this her breathing slowly got worse and she decided to come to ER after taking  2 rounds of DuoNeb with no improvement.  - Patient has chronic orthopnea which is unchanged. She does have lower extremity edema which is improved. No sick contacts. Afebrile.   Chest x-ray shows changes from COPD images reviewed 03/25/2015  .   PAST MEDICAL HISTORY   Past Medical History  Diagnosis Date  . COPD (chronic obstructive pulmonary disease) ([place])     on nocturnal home o2  . Hypertension   . CAD (coronary artery disease)     s/p PCI  . Diastolic CHF ([place])   .  Hyperlipidemia   . GERD (gastroesophageal reflux disease)   . Osteoporosis   . Chronic low back pain   . CVA (cerebral infarction)   . DJD (degenerative joint disease)   . Colon polyps      SURGICAL HISTORY   Past Surgical History  Procedure Laterality Date  . Carotid endarterectomy      Right  . Cholecystectomy    . Appendectomy    . Back surgery    . Partial hysterectomy       FAMILY HISTORY   Family History  Problem Relation Age of Onset  . Atrial fibrillation Mother   . CAD Father      SOCIAL HISTORY   Social History  Substance Use Topics  . Smoking status: Former Smoker -- 0.10 packs/day    Types: Cigarettes  . Smokeless tobacco: Never Used  . Alcohol Use: No  patient still smokes states that she stopped 10 days ago   MEDICATIONS    Home Medication:  No current outpatient prescriptions on file.  Current Medication:  Current facility-administered medications:  .  0.9 %  sodium chloride infusion, 250 mL, Intravenous, PRN, Hillary Bow, MD .  acetaminophen (TYLENOL) tablet 650 mg, 650 mg, Oral, Q6H PRN **OR** acetaminophen (TYLENOL) suppository 650 mg, 650 mg, Rectal, Q6H PRN, Srikar Sudini, MD .  azithromycin (ZITHROMAX) tablet 500 mg, 500 mg, Oral, Q1400, Lenis Noon, RPH, 500 mg at 03/24/15 1823 .  bisacodyl (DULCOLAX) EC tablet 5 mg, 5 mg, Oral, Daily PRN, Srikar Sudini, MD .  chlorpheniramine-HYDROcodone (TUSSIONEX) 10-8 MG/5ML suspension 5 mL, 5 mL, Oral, Q12H PRN, Sylvan Cheese, MD, 5 mL at 03/24/15 2141 .  clopidogrel (PLAVIX) tablet 75 mg, 75 mg, Oral, Daily, Hillary Bow, MD, 75 mg at 03/25/15 1025 .  enoxaparin (LOVENOX) injection 40 mg, 40 mg, Subcutaneous, Q24H, Srikar Sudini, MD, 40 mg at 03/24/15 2114 .  furosemide (LASIX) tablet 40 mg, 40 mg, Oral, Rudi Coco, RPH, 40 mg at 03/24/15 1823 .  hydrALAZINE (APRESOLINE) injection 10 mg, 10 mg, Intravenous, Q6H PRN, Srikar Sudini, MD .  ipratropium-albuterol (DUONEB) 0.5-2.5 (3)  MG/3ML nebulizer solution 3 mL, 3 mL, Nebulization, Q4H, Srikar Sudini, MD, 3 mL at 03/25/15 0900 .  lisinopril (PRINIVIL,ZESTRIL) tablet 20 mg, 20 mg, Oral, Daily, Lenis Noon, RPH, 20 mg at 03/25/15 1025 .  methylPREDNISolone sodium succinate (SOLU-MEDROL) 125 mg/2 mL injection 60 mg, 60 mg, Intravenous, Q12H, Hillary Bow, MD, 60 mg at 03/25/15 0747 .  mometasone-formoterol (DULERA) 200-5 MCG/ACT inhaler 2 puff, 2 puff, Inhalation, BID, Hillary Bow, MD, 2 puff at 03/25/15 0748 .  nitroGLYCERIN (NITROSTAT) SL tablet 0.4 mg, 0.4 mg, Sublingual, Q5 min PRN, Srikar Sudini, MD .  ondansetron (ZOFRAN) tablet 4 mg, 4 mg, Oral, Q6H PRN **OR** ondansetron (ZOFRAN) injection 4 mg, 4 mg, Intravenous, Q6H PRN, Hillary Bow, MD, 4 mg at 03/25/15 0747 .  oxyCODONE (Oxy IR/ROXICODONE) immediate release tablet 10 mg, 10 mg, Oral, Q4H PRN, Hillary Bow, MD, 10 mg at 03/25/15 1025 .  pantoprazole (PROTONIX) EC tablet 40 mg, 40 mg, Oral, BID AC, Srikar Sudini, MD, 40 mg at 03/25/15 0747 .  polyethylene glycol (MIRALAX / GLYCOLAX) packet 17 g, 17 g, Oral, Daily PRN, Srikar Sudini, MD .  simvastatin (ZOCOR) tablet 20 mg, 20 mg, Oral, QHS, Srikar Sudini, MD, 20 mg at 03/24/15 2114 .  sodium chloride flush (NS) 0.9 % injection 3 mL, 3 mL, Intravenous, Q12H, Hillary Bow, MD, 3 mL at 03/25/15 0808 .  sodium chloride flush (NS) 0.9 % injection 3 mL, 3 mL, Intravenous, PRN, Hillary Bow, MD    ALLERGIES   Dexlansoprazole and Ultram     REVIEW OF SYSTEMS   Review of Systems  Constitutional: Positive for malaise/fatigue. Negative for fever, chills, weight loss and diaphoresis.  HENT: Negative for congestion and hearing loss.   Eyes: Negative for blurred vision and double vision.  Respiratory: Positive for cough, shortness of breath and wheezing.   Cardiovascular: Positive for orthopnea. Negative for chest pain, palpitations and leg swelling.  Gastrointestinal: Negative for heartburn, nausea, vomiting  and abdominal pain.  Genitourinary: Negative for hematuria.  Musculoskeletal: Positive for joint pain. Negative for myalgias and neck pain.  Skin: Negative for rash.  Neurological: Negative for dizziness, weakness and headaches.  Endo/Heme/Allergies: Does not bruise/bleed easily.  Psychiatric/Behavioral: The patient is nervous/anxious.   All other systems reviewed and are negative.    VS: BP 151/70 mmHg  Pulse 90  Temp(Src) 97.9 F (36.6 C) (Axillary)  Resp 22  Ht 5\' 2"  (1.575 m)  Wt 207 lb 8.5 oz (94.136 kg)  BMI 37.95 kg/m2  SpO2 88%     PHYSICAL EXAM  Physical Exam  Constitutional: She is oriented to person, place, and time. She appears well-developed and well-nourished. She appears distressed.  HENT:  Head: Normocephalic and atraumatic.  Mouth/Throat: No oropharyngeal exudate.  Eyes: EOM are normal. Pupils are equal, round, and reactive to light.  Neck: Normal range of motion. Neck supple.  Cardiovascular: Normal rate, regular rhythm and normal heart sounds.   No murmur heard. Pulmonary/Chest: No stridor. She is in respiratory distress. She has wheezes.  Mild resp distress  Abdominal: Soft. Bowel sounds are normal. She exhibits no distension.  Musculoskeletal: Normal range of motion. She exhibits no edema.  Neurological: She is alert and oriented to person, place, and time.  Skin: Skin is warm. She is not diaphoretic.  Psychiatric: She has a normal mood and affect.        LABS    Recent Labs     03/24/15  1254  HGB  13.6  HCT  41.1  MCV  94.0  WBC  10.5  BUN  15  CREATININE  0.73  GLUCOSE  91  CALCIUM  9.1  ,         IMAGING    Dg Chest 2 View  03/24/2015  CLINICAL DATA:  65 year old female with shortness of breath this morning (awoken at 3 a.m. with acute symptoms). Right smoker. History of COPD. Prior history of myocardial infarction. EXAM: CHEST  2 VIEW COMPARISON:  Chest x-ray 03/09/2015. FINDINGS: Lung volumes are normal. No consolidative  airspace disease. No pleural effusions. No pneumothorax. No pulmonary nodule or mass noted. Pulmonary vasculature and the cardiomediastinal silhouette are within normal limits. Atherosclerosis in the thoracic aorta. IMPRESSION: 1. No radiographic evidence of acute cardiopulmonary disease. 2. Atherosclerosis. Electronically Signed   By: Vinnie Langton M.D.   On: 03/24/2015 14:02   Dg Chest 2 View  03/09/2015  CLINICAL DATA:  Pt to ED c/o of posterior neck pain, shortness of breath worsened with exertion, cough since last night, audible wheezing. Known copd. EXAM: CHEST - 2 VIEW COMPARISON:  02/26/2015 FINDINGS: Mild hyperinflation. Attenuated bronchovascular markings in the upper lobes. Coarse bibasilar interstitial markings. No confluent airspace infiltrate or overt edema. Normal heart size. No effusion.  No pneumothorax. Visualized skeletal structures are unremarkable. Clips in the upper abdomen. IMPRESSION: Hyperinflation without acute abnormality. Electronically Signed   By: Lucrezia Europe M.D.   On: 03/09/2015 10:48   Dg Chest 2 View  02/26/2015  CLINICAL DATA:  Dyspnea.  Wheezing.  Shortness of breath.  COPD. EXAM: CHEST  2 VIEW COMPARISON:  01/01/2015 FINDINGS: The heart size and mediastinal contours are within normal limits. Both lungs are clear. The visualized skeletal structures are unremarkable. IMPRESSION: No active cardiopulmonary disease. Electronically Signed   By: Earle Gell M.D.   On: 02/26/2015 19:05   Ct Angio Neck W/cm &/or Wo/cm  03/09/2015  CLINICAL DATA:  Endarterectomy 3 years ago. Difficulty breathing over the last several days with wheezing. Neck pain. EXAM: CT ANGIOGRAPHY NECK TECHNIQUE: Multidetector CT imaging of the neck was performed using the standard protocol during bolus administration of intravenous contrast. Multiplanar CT image reconstructions and MIPs were obtained to evaluate the vascular anatomy. Carotid stenosis measurements (when applicable) are obtained utilizing  NASCET criteria, using the distal internal carotid diameter as the denominator. CONTRAST:  31mL OMNIPAQUE IOHEXOL 350 MG/ML SOLN COMPARISON:  02/20/2014.  01/14/2005. FINDINGS: Aortic arch: The arch shows atherosclerotic change but no aneurysm or dissection. Branching pattern of the brachiocephalic vessels from the arch is normal without origin stenosis.  Right carotid system: Right common carotid artery is widely patent to the bifurcation. The carotid bifurcation appears normal without stenosis or irregularity. Cervical internal carotid artery is tortuous but otherwise normal. Left carotid system: Left common carotid artery shows some atherosclerotic plaque but no significant stenosis. There is atherosclerotic disease at the carotid bifurcation with soft and calcified plaque. Minimal diameter of the proximal ICA is 6 mm, therefore there is no stenosis. Beyond that, the cervical ICA appears normal. Vertebral arteries:50% stenosis at the right vertebral artery origin. Beyond that, the vessel is widely patent through the cervical region to the basilar. The left vertebral artery is occluded at its origin. There is some reconstituted flow in the upper cervical region. Small amount of flow at the foramen magnum level and in the distal vertebral artery. Distal vertebral artery flow could be retrograde. Skeleton: Ordinary spondylosis Other neck: No mass or lymphadenopathy. Lung apices show mild emphysema. 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 IMPRESSION: Interval carotid endarterectomy on the right. Wide patency of the right carotid system. Atherosclerosis at the left carotid bifurcation but no stenosis. Mild wall irregularity. 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 50% stenosis at the proximal right vertebral artery. 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 Electronically Signed    By: Nelson Chimes M.D.   On: 03/09/2015 13:51   CXR images reviewed 03/25/2015    ASSESSMENT/PLAN   65 yo white female with extensive b/l wheezing with acute COPD exacerbation likely from acute bronchitis and active smoking  1.continue oxygen as needed keep o2 sats 88-92% 2.continue IV steroids, cont iv abx 3.aggressive BD therapy-dounebs every 4 hrs 4.start pulmicort nebs BID 5.start dulera and spiriva 6.smoking cessation advised  Will relay to Dr. Raul Del   I have personally obtained a history, examined the patient, evaluated laboratory and independently reviewed imaging results, formulated the assessment and plan and placed orders.  The Patient requires high complexity decision making for assessment and support, frequent evaluation and titration of therapies, application of advanced monitoring technologies and extensive interpretation of multiple databases.    Patient satisfied with Plan of action and management. All questions answered  Corrin Parker, M.D.  [facility]  Medical Director [place] Director [facility] Cardio-Pulmonary Department

## 2015-03-25 NOTE — Progress Notes (Signed)
Initial Nutrition Assessment   INTERVENTION:   Meals and Snacks: Cater to patient preferences on Regular diet Order   NUTRITION DIAGNOSIS:   No nutrition diagnosis at this time  GOAL:   Patient will meet greater than or equal to 90% of their needs  MONITOR:   PO intake, Labs, Weight trends, I & O's  REASON FOR ASSESSMENT:   Consult COPD Protocol  ASSESSMENT:    Pt admitted with difficulty breathing secondary to COPD exacerbation.  Past Medical History  Diagnosis Date  . COPD (chronic obstructive pulmonary disease) ([place])     on nocturnal home o2  . Hypertension   . CAD (coronary artery disease)     s/p PCI  . Diastolic CHF ([place])   . Hyperlipidemia   . GERD (gastroesophageal reflux disease)   . Osteoporosis   . Chronic low back pain   . CVA (cerebral infarction)   . DJD (degenerative joint disease)   . Colon polyps      Diet Order:  Diet Heart Room service appropriate?: Yes; Fluid consistency:: Thin    Current Nutrition: Pt reports eating waffles this am, 75% recorded  Food/Nutrition-Related History: Pt reports good appetite PTA, eating well.   Scheduled Medications:  . azithromycin  500 mg Oral Q1400  . clopidogrel  75 mg Oral Daily  . enoxaparin (LOVENOX) injection  40 mg Subcutaneous Q24H  . furosemide  40 mg Oral QODAY  . ipratropium-albuterol  3 mL Nebulization Q4H  . lisinopril  20 mg Oral Daily  . methylPREDNISolone (SOLU-MEDROL) injection  60 mg Intravenous Q12H  . mometasone-formoterol  2 puff Inhalation BID  . pantoprazole  40 mg Oral BID AC  . simvastatin  20 mg Oral QHS  . sodium chloride flush  3 mL Intravenous Q12H     Electrolyte/Renal Profile and Glucose Profile:   Recent Labs Lab 03/24/15 1254  NA 138  K 3.7  CL 107  CO2 28  BUN 15  CREATININE 0.73  CALCIUM 9.1  GLUCOSE 91   Protein Profile:  Recent Labs Lab 03/24/15 1254  ALBUMIN 3.8    Gastrointestinal Profile: Last BM: 03/23/2015    Weight Change: Pt  reports stable weight   Height:   Ht Readings from Last 1 Encounters:  03/24/15 5\' 2"  (1.575 m)    Weight:   Wt Readings from Last 1 Encounters:  03/25/15 207 lb 8.5 oz (94.136 kg)     BMI:  Body mass index is 37.95 kg/(m^2).   EDUCATION NEEDS:   No education needs identified at this time   [place], RD, LDN Pager [phone] Weekend/On-Call Pager [phone]

## 2015-03-25 NOTE — Progress Notes (Signed)
Patient's up and ambulating in hallway with PT.  

## 2015-03-25 NOTE — Plan of Care (Signed)
Problem: Safety: Goal: Ability to remain free from injury will improve Outcome: Progressing Free from falls this shift.  Problem: Pain Managment: Goal: General experience of comfort will improve Outcome: Progressing Chronic back pain control with oral pain medication

## 2015-03-25 NOTE — Progress Notes (Signed)
OT Cancellation Note  Patient Details Name: Jamiera Phi MRN: [id number] DOB: July 03, 1950   Cancelled Treatment:     Attempted to see patient for OT evaluation this date.  She currently denies the need for OT.  States she has been independent with self care skills and when she has an exacerbation, her significant other helps her with lower body dressing and getting in and out of the shower.  Offered to show patient adaptive equipment which would help with lower body bathing and dressing to increase her independence during times of SOB as well as energy conservation techniques however she refused OT services and significant other present who states he will help patient.  OT recommends shower chair for patient since she does not have one currently which will improve her safety and performance for bathing at home.    Lovett,Amy 03/25/2015, 11:24 AM

## 2015-03-25 NOTE — Care Management Note (Addendum)
Case Management Note  Patient Details  Name: Dawn Donaldson MRN: [id number] Date of Birth: 1950-10-09  Subjective/Objective:                  Met with patient and a friend she's lived with for over 20 years per patient: Lives with friend, Ashok Pall [phone]). She states she is on chronic O2 through [place]. Has states her nebulizer does not work well and would benefit from a new one. She also requests a shower chair as she says she struggles to get in and out of the shower. She claims that Kasandra Knudsen assists her in the home. She states she does not require any ambulatory assistance.Her PCP is Dr. Bernita Buffy. She frequents medical attention due to COPD and is now COPD Gold. RN will provide packet to patient. She has used home health services in the past but does not remember name. She refused home health this visit. She uses [facility] [employer] and denies difficulty obtaining.   Action/Plan: No RNCM needs at this time. Nebulizer and shower chair requested from Will with [facility]. Rx requested from Dr. Manuella Ghazi.   Expected Discharge Date:  03/26/15               Expected Discharge Plan:     In-House Referral:     Discharge planning Services  CM Consult  Post Acute Care Choice:    Choice offered to:  Patient  DME Arranged:    DME Agency:     HH Arranged:    [place] Agency:     Status of Service:  Completed, signed off  Medicare Important Message Given:    Date Medicare IM Given:    Medicare IM give by:    Date Additional Medicare IM Given:    Additional Medicare Important Message give by:     If discussed at [place] of Stay Meetings, dates discussed:    Additional Comments: Nebulizer and shower chair delivered to this room.   Marshell Garfinkel, RN 03/25/2015, 8:47 AM

## 2015-03-25 NOTE — Evaluation (Signed)
Physical Therapy Evaluation Patient Details Name: Dawn Donaldson MRN: [id number] DOB: 1950-06-18 Today's Date: 03/25/2015   History of Present Illness  Patient is a 65 y/o female with a history of COPD with acute exacerbation of the same. She uses supplemental O2 PRN throughout the day and at night.   Clinical Impression  Patient has been quite active and independent at home, she reports no falls and that she is able to ambulate in the community without AD. In this session she demonstrates decreased O2 perfusion, otherwise she appears to be at or near her physical baseline. Very minor balance deficits noted, though these are likely not new based on discussion with patient. No drop in O2 sats during ambulation noted while on 2L of supplemental O2. Patient does not appear to have balance decline, so much as cardiopulmonary decline and would likely benefit from cardiopulmonary rehab after medically appropriate for discharge.     Follow Up Recommendations  (Pulmonary rehab is appropriate)    Equipment Recommendations       Recommendations for Other Services       Precautions / Restrictions Precautions Precautions: None Restrictions Weight Bearing Restrictions: No      Mobility  Bed Mobility Overal bed mobility: Independent             General bed mobility comments: No deficits in bed mobility.   Transfers Overall transfer level: Needs assistance Equipment used: None Transfers: Sit to/from Stand Sit to Stand: Supervision         General transfer comment: Patient able to complete independently, though turned quickly and momentarily lost balance. She was able to recover by grasping on to railing.   Ambulation/Gait Ambulation/Gait assistance: Supervision Ambulation Distance (Feet): 200 Feet Assistive device: None Gait Pattern/deviations: WFL(Within Functional Limits)   Gait velocity interpretation: Below normal speed for age/gender General Gait Details: No gait  abnormalities identified. Modified DGI score of 11/12 indicating she is not at high risk of falling.   SaO2  at rest = 93% at rest on 2L SaO2 on room air while ambulating =% SaO2 on 2 liters of O2 while ambulating = 92%   Stairs            Wheelchair Mobility    Modified Rankin (Stroke Patients Only)       Balance Overall balance assessment: No apparent balance deficits (not formally assessed)                                           Pertinent Vitals/Pain Pain Assessment:  (No mention of pain during this session. )    Home Living Family/patient expects to be discharged to:: Private residence Living Arrangements: Spouse/significant other Available Help at Discharge: Family Type of Home: Mobile home Home Access: Stairs to enter Entrance Stairs-Rails: Right;Left;Can reach both Entrance Stairs-Number of Steps: 2 Home Layout: One level Home Equipment: None      Prior Function Level of Independence: Independent         Comments: Patient reports being independent in all ADLs including bathing/dressing; she does report needing cart when walking at story and needing extra rest breaks with standing/walking due to shortness of breath     Hand Dominance        Extremity/Trunk Assessment   Upper Extremity Assessment: Overall WFL for tasks assessed           Lower Extremity Assessment: Overall [facility]  for tasks assessed         Communication   Communication: No difficulties  Cognition Arousal/Alertness: Awake/alert Behavior During Therapy: WFL for tasks assessed/performed Overall Cognitive Status: Within Functional Limits for tasks assessed                      General Comments      Exercises        Assessment/Plan    PT Assessment Patient needs continued PT services  PT Diagnosis Difficulty walking   PT Problem List Cardiopulmonary status limiting activity;Decreased strength;Decreased balance  PT Treatment  Interventions Gait training;Stair training;Therapeutic activities;Therapeutic exercise;Balance training   PT Goals (Current goals can be found in the Care Plan section) Acute Rehab PT Goals Patient Stated Goal: To return home  PT Goal Formulation: With patient/family Time For Goal Achievement: 04/08/15 Potential to Achieve Goals: Good Additional Goals Additional Goal #1:  (Pt will score a low falls risk on BBS)    Frequency Min 2X/week   Barriers to discharge        Co-evaluation               End of Session Equipment Utilized During Treatment: Oxygen Activity Tolerance: Patient tolerated treatment well Patient left: in bed;with call bell/phone within reach;with family/visitor present Nurse Communication: Mobility status         Time: [id number] PT Time Calculation (min) (ACUTE ONLY): 9 min   Charges:   PT Evaluation $PT Eval Low Complexity: 1 Procedure     PT G Codes:       Kerman Passey, PT, DPT    03/25/2015, 12:53 PM

## 2015-03-25 NOTE — Clinical Social Work Note (Signed)
Clinical Social Work Assessment  Patient Details  Name: Dawn Donaldson MRN: [id number] Date of Birth: January 04, 1951  Date of referral:  03/25/15               Reason for consult:  Other (Comment Required) (COPD Gold )                Permission sought to share information with:    Permission granted to share information::     Name::        Agency::     Relationship::     Contact Information:     Housing/Transportation Living arrangements for the past 2 months:  Single Family Home Source of Information:  Patient Patient Interpreter Needed:  None Criminal Activity/Legal Involvement Pertinent to Current Situation/Hospitalization:  No - Comment as needed Significant Relationships:  Adult Children, Significant Other Lives with:  Minor Children, Significant Other Do you feel safe going back to the place where you live?  Yes Need for family participation in patient care:  No (Coment)  Care giving concerns:  Patient lives in [place] with her significant other Danny.    Social Worker assessment / plan:  Holiday representative (CSW) received COPD Gold consult. Patient walked 200 feet with PT. PT is recommending pulmonary rehab. CSW met with patient to complete anxiety and depression screen. Patient reported that she lives in [place] with her significant other Danny. Per patient her and Kasandra Knudsen are not married but they have been together for years. Patient reported that she is also rasing her 63 y.o granddaughter. Patient reported that she has adult children that live near by. Patient reported that she is on oxygen at home. Patient denied symptoms of depression. Patient reported that she only experiences anxiety when she she feels short of breath. Patient reported that she takes Ativan when she has anxiety. CSW offered patient resources for outpatient mental health treatment. Patient declined resources and reported no other needs at this time. Please reconsult if future social work needs arise.  CSW signing off.   Employment status:  Disabled (Comment on whether or not currently receiving Disability), Retired Forensic scientist:    PT Recommendations:   (Pulmonary Rehab) Information / Referral to community resources:     Patient/Family's Response to care:  Patient is agreeable on returning home and declined mental health resources.   Patient/Family's Understanding of and Emotional Response to Diagnosis, Current Treatment, and Prognosis:  Patient was pleasant and thanked CSW for visit.   Emotional Assessment Appearance:  Appears stated age Attitude/Demeanor/Rapport:    Affect (typically observed):  Accepting, Adaptable, Pleasant Orientation:  Oriented to Self, Oriented to Place, Oriented to  Time, Oriented to Situation Alcohol / Substance use:  Not Applicable Psych involvement (Current and /or in the community):  No (Comment)  Discharge Needs  Concerns to be addressed:  Discharge Planning Concerns Readmission within the last 30 days:  No Current discharge risk:  Chronically ill Barriers to Discharge:  Continued Medical Work up   Elwyn Reach 03/25/2015, 4:34 PM

## 2015-03-25 NOTE — Progress Notes (Signed)
[facility] NAME: Dawn Donaldson    MR#:  [id number]  DATE OF BIRTH:  April 15, 1950  SUBJECTIVE:  CHIEF COMPLAINT:   Chief Complaint  Patient presents with  . Shortness of Breath  Feels somewhat better but still short of breath and has cough  REVIEW OF SYSTEMS:  Review of Systems  Constitutional: Negative for fever, weight loss, malaise/fatigue and diaphoresis.  HENT: Negative for ear discharge, ear pain, hearing loss, nosebleeds, sore throat and tinnitus.   Eyes: Negative for blurred vision and pain.  Respiratory: Positive for cough, shortness of breath and wheezing. Negative for hemoptysis.   Cardiovascular: Negative for chest pain, palpitations, orthopnea and leg swelling.  Gastrointestinal: Negative for heartburn, nausea, vomiting, abdominal pain, diarrhea, constipation and blood in stool.  Genitourinary: Negative for dysuria, urgency and frequency.  Musculoskeletal: Negative for myalgias and back pain.  Skin: Negative for itching and rash.  Neurological: Negative for dizziness, tingling, tremors, focal weakness, seizures, weakness and headaches.  Psychiatric/Behavioral: Negative for depression. The patient is not nervous/anxious.     DRUG ALLERGIES:   Allergies  Allergen Reactions  . Dexlansoprazole Nausea And Vomiting  . Ultram [Tramadol] Itching   VITALS:  Blood pressure 130/65, pulse 95, temperature 98.4 F (36.9 C), temperature source Oral, resp. rate 20, height 5\' 2"  (1.575 m), weight 94.136 kg (207 lb 8.5 oz), SpO2 99 %. PHYSICAL EXAMINATION:  Physical Exam  Constitutional: She is oriented to person, place, and time and well-developed, well-nourished, and in no distress.  HENT:  Head: Normocephalic and atraumatic.  Eyes: Conjunctivae and EOM are normal. Pupils are equal, round, and reactive to light.  Neck: Normal range of motion. Neck supple. No tracheal deviation present. No thyromegaly present.   Cardiovascular: Normal rate, regular rhythm and normal heart sounds.   Pulmonary/Chest: She is in respiratory distress. She has wheezes. She exhibits no tenderness.  Abdominal: Soft. Bowel sounds are normal. She exhibits no distension. There is no tenderness.  Musculoskeletal: Normal range of motion.  Neurological: She is alert and oriented to person, place, and time. No cranial nerve deficit.  Skin: Skin is warm and dry. No rash noted.  Psychiatric: Mood and affect normal.   LABORATORY PANEL:   CBC  Recent Labs Lab 03/24/15 1254  WBC 10.5  HGB 13.6  HCT 41.1  PLT 265   ------------------------------------------------------------------------------------------------------------------ Chemistries   Recent Labs Lab 03/24/15 1254  NA 138  K 3.7  CL 107  CO2 28  GLUCOSE 91  BUN 15  CREATININE 0.73  CALCIUM 9.1  AST 20  ALT 14  ALKPHOS 92  BILITOT 0.6   RADIOLOGY:  No results found. ASSESSMENT AND PLAN:  * Acute COPD exacerbation with acute on chronic respiratory failure - Continue IV steroids, Antibiotics. Azithromycin - Scheduled Nebulizers - Inhalers - Wean O2 as tolerated - Consult pulmonary as COPD GOLD pt  * Hypertension uncontrolled Much better controlled  * Chronic diastolic CHF No pulmonary edema on chest x-ray. We'll continue home dose of Lasix.  * DVT prophylaxis with Lovenox     All the records are reviewed and case discussed with Care Management/Social Worker. Management plans discussed with the patient, family and they are in agreement.  CODE STATUS: FULL CODE  TOTAL TIME TAKING CARE OF THIS PATIENT: 35 minutes.   More than 50% of the time was spent in counseling/coordination of care: YES   POSSIBLE D/C IN 1-2 DAYS, DEPENDING ON CLINICAL CONDITION.   [place], [place]  M.D on 03/25/2015 at 2:43 PM  Between 7am to 6pm - Pager - [phone]  After 6pm go to [email or web address] - password [email or web address] [facility]  Office   [phone]  CC: Primary care physician; Lorelee Market, MD  Note: This dictation was prepared with Dragon dictation along with smaller phrase technology. Any transcriptional errors that result from this process are unintentional.

## 2015-03-25 NOTE — Progress Notes (Signed)
Patient given copd gold packet, states " I will look at it".

## 2015-03-26 LAB — BASIC METABOLIC PANEL
Anion gap: 3 — ABNORMAL LOW (ref 5–15)
BUN: 22 mg/dL — ABNORMAL HIGH (ref 6–20)
CALCIUM: 9.1 mg/dL (ref 8.9–10.3)
CHLORIDE: 106 mmol/L (ref 101–111)
CO2: 30 mmol/L (ref 22–32)
CREATININE: 0.81 mg/dL (ref 0.44–1.00)
GFR calc Af Amer: >60 mL/min (ref >60)
GFR calc non Af Amer: >60 mL/min (ref >60)
GLUCOSE: 125 mg/dL — AB (ref 65–99)
Potassium: 5 mmol/L (ref 3.5–5.1)
Sodium: 139 mmol/L (ref 135–145)

## 2015-03-26 LAB — CBC
HEMATOCRIT: 38.3 % (ref 35.0–47.0)
Hemoglobin: 12.6 g/dL (ref 12.0–16.0)
MCH: 31.1 pg (ref 26.0–34.0)
MCHC: 32.9 g/dL (ref 32.0–36.0)
MCV: 94.5 fL (ref 80.0–100.0)
PLATELETS: 239 10*3/uL (ref 150–440)
RBC: 4.05 MIL/uL (ref 3.80–5.20)
RDW: 14.3 % (ref 11.5–14.5)
WBC: 17.3 10*3/uL — AB (ref 3.6–11.0)

## 2015-03-26 MED ORDER — LORAZEPAM 1 MG PO TABS
1.0000 mg | ORAL_TABLET | Freq: Four times a day (QID) | ORAL | Status: DC | PRN
  Administered 2015-03-26: 1 mg via ORAL
  Filled 2015-03-26: qty 1

## 2015-03-26 MED ORDER — METHYLPREDNISOLONE SODIUM SUCC 125 MG IJ SOLR
60.0000 mg | Freq: Four times a day (QID) | INTRAMUSCULAR | Status: DC
  Administered 2015-03-26 – 2015-03-27 (×5): 60 mg via INTRAVENOUS
  Filled 2015-03-26 (×5): qty 2

## 2015-03-26 NOTE — Progress Notes (Signed)
RN called about pt being SOB and wheezing and requested RT to come assess pt. As I walked in room pt was standing beside sink audibly wheezing. I introduced myself and pt stated she was fine and did not need a breathing treatment. I asked pt if she wanted to sit on the side of the bed and try to calm her breathing and she stated "no, she had just went to the bathroom and had a breathing treatment, and she was fine."

## 2015-03-26 NOTE — Progress Notes (Signed)
Pt complained to her nurse she was having difficulty breathing. She go upset with her nurse when she could not give her another dose of solu-medrol. I went in to see if I could assist her in any way. She said she would be ok. She did ask me to turn her 02 up to 3 liters. I did that for her. The pt was sitting on side of the bed.

## 2015-03-26 NOTE — Care Management Important Message (Signed)
Important Message  Patient Details  Name: Dawn Donaldson MRN: [id number] Date of Birth: 06-Oct-1950   Medicare Important Message Given:  Yes    Juliann Pulse A Narek Kniss 03/26/2015, 1:35 PM

## 2015-03-26 NOTE — Progress Notes (Signed)
Patient called wanting a shot to help her breathe. I went into patient room and ask her what shot to help her breathe. She stated the shot that I put in her IV. I explained to her that I couldn't give her no more Solu - Medrol because it is scheduled for every 12 hours. I checked her O2 sats and they were 95-97%. I called respiratory to come and check on her. Patient had refused bed alarm earlier in the night. I also told patient to call me if she needed to go to the restroom because she gets so SOB. Will continue to monitor.

## 2015-03-26 NOTE — Progress Notes (Signed)
Dawn Donaldson NAME: Dawn Donaldson    MR#:  [id number]  DATE OF BIRTH:  1950-09-18  SUBJECTIVE:  CHIEF COMPLAINT:   Chief Complaint  Patient presents with  . Shortness of Breath  had episode of acute worsening of SOB y'day late afternoon - requested RT to assess pt. pt was standing beside sink audibly wheezing. her O2 sats were 95-97%, still wheezy and seems anxious (says she uses ativan at home) REVIEW OF SYSTEMS:  Review of Systems  Constitutional: Negative for fever, weight loss, malaise/fatigue and diaphoresis.  HENT: Negative for ear discharge, ear pain, hearing loss, nosebleeds, sore throat and tinnitus.   Eyes: Negative for blurred vision and pain.  Respiratory: Positive for cough, shortness of breath and wheezing. Negative for hemoptysis.   Cardiovascular: Negative for chest pain, palpitations, orthopnea and leg swelling.  Gastrointestinal: Negative for heartburn, nausea, vomiting, abdominal pain, diarrhea, constipation and blood in stool.  Genitourinary: Negative for dysuria, urgency and frequency.  Musculoskeletal: Negative for myalgias and back pain.  Skin: Negative for itching and rash.  Neurological: Negative for dizziness, tingling, tremors, focal weakness, seizures, weakness and headaches.  Psychiatric/Behavioral: Negative for depression. The patient is nervous/anxious.    DRUG ALLERGIES:   Allergies  Allergen Reactions  . Dexlansoprazole Nausea And Vomiting  . Ultram [Tramadol] Itching   VITALS:  Blood pressure 128/69, pulse 69, temperature 97.7 F (36.5 C), temperature source Oral, resp. rate 22, height 5\' 2"  (1.575 m), weight 94.136 kg (207 lb 8.5 oz), SpO2 97 %. PHYSICAL EXAMINATION:  Physical Exam  Constitutional: She is oriented to person, place, and time and well-developed, well-nourished, and in no distress.  HENT:  Head: Normocephalic and atraumatic.  Eyes: Conjunctivae and EOM are normal. Pupils are  equal, round, and reactive to light.  Neck: Normal range of motion. Neck supple. No tracheal deviation present. No thyromegaly present.  Cardiovascular: Normal rate, regular rhythm and normal heart sounds.   Pulmonary/Chest: She is in respiratory distress. She has wheezes. She exhibits no tenderness.  Abdominal: Soft. Bowel sounds are normal. She exhibits no distension. There is no tenderness.  Musculoskeletal: Normal range of motion.  Neurological: She is alert and oriented to person, place, and time. No cranial nerve deficit.  Skin: Skin is warm and dry. No rash noted.  Psychiatric: Affect normal. Her mood appears anxious.   LABORATORY PANEL:   CBC  Recent Labs Lab 03/26/15 0556  WBC 17.3*  HGB 12.6  HCT 38.3  PLT 239   ------------------------------------------------------------------------------------------------------------------ Chemistries   Recent Labs Lab 03/24/15 1254 03/26/15 0556  NA 138 139  K 3.7 5.0  CL 107 106  CO2 28 30  GLUCOSE 91 125*  BUN 15 22*  CREATININE 0.73 0.81  CALCIUM 9.1 9.1  AST 20  --   ALT 14  --   ALKPHOS 92  --   BILITOT 0.6  --    RADIOLOGY:  No results found. ASSESSMENT AND PLAN:  * Acute COPD exacerbation with acute on chronic respiratory failure - Continue IV steroids (increased freq to QID), Antibiotics. Azithromycin - Scheduled Nebulizers - Inhalers - Wean O2 as tolerated - COPD GOLD pt  * Hypertension uncontrolled Much better controlled  * Chronic diastolic CHF No pulmonary edema on chest x-ray. We'll continue home dose of Lasix.  * Anxiety - start Ativan Q 6 hrs prn  * DVT prophylaxis with Lovenox     All the records are reviewed and case discussed with  Care Management/Social Worker. Management plans discussed with the patient, family and they are in agreement.  CODE STATUS: FULL CODE  TOTAL TIME TAKING CARE OF THIS PATIENT: 35 minutes.   More than 50% of the time was spent in counseling/coordination of  care: YES   POSSIBLE D/C IN 1-2 DAYS, DEPENDING ON CLINICAL CONDITION.   [facility], Tashari Schoenfelder M.D on 03/26/2015 at 7:38 AM  Between 7am to 6pm - Pager - [phone]  After 6pm go to [email or web address] - password [email or web address] [facility]  Office  [phone]  CC: Primary care physician; Lorelee Market, MD  Note: This dictation was prepared with Dragon dictation along with smaller phrase technology. Any transcriptional errors that result from this process are unintentional.

## 2015-03-27 MED ORDER — AZITHROMYCIN 500 MG PO TABS: 500.0000 mg | ORAL_TABLET | Freq: Every day | ORAL | Status: DC

## 2015-03-27 MED ORDER — PREDNISONE 10 MG (21) PO TBPK: 10.0000 mg | ORAL_TABLET | Freq: Every day | ORAL | Status: DC

## 2015-03-27 NOTE — Care Management (Addendum)
Hospital bed needed and requested from [facility] care. Bedside commode, nebulizer, and shower chair has been delivered. Patient still refusing home health nursing/PT. Patient contact [phone].

## 2015-03-27 NOTE — Progress Notes (Signed)
Patient discharging home, instructions given to patient, verbalized understanding. Husband providing transportation.

## 2015-03-27 NOTE — Care Management (Signed)
Patient suffers from respiratory failure and has trouble breathing at night when head is elevated less Than 30 degrees or more. Bed wedges do not provide enough elevation to resolve breathing issues. Difficulty breathing causes patient to require frequent and immediate changes in body position which cannot be achieved with a normal bed.

## 2015-03-27 NOTE — Discharge Instructions (Signed)
Chronic Obstructive Pulmonary Disease °Chronic obstructive pulmonary disease (COPD) is a common lung problem. In COPD, the flow of air from the lungs is limited. The way your lungs work will probably never return to normal, but there are things you can do to improve your lungs and make yourself feel better. Your doctor may treat your condition with: °· Medicines. °· Oxygen. °· Lung surgery. °· Changes to your diet. °· Rehabilitation. This may involve a team of specialists. °HOME CARE °· Take all medicines as told by your doctor. °· Avoid medicines or cough syrups that dry up your airway (such as antihistamines) and do not allow you to get rid of thick spit. You do not need to avoid them if told differently by your doctor. °· If you smoke, stop. Smoking makes the problem worse. °· Avoid being around things that make your breathing worse (like smoke, chemicals, and fumes). °· Use oxygen therapy and therapy to help improve your lungs (pulmonary rehabilitation) if told by your doctor. If you need home oxygen therapy, ask your doctor if you should buy a tool to measure your oxygen level (oximeter). °· Avoid people who have a sickness you can catch (contagious). °· Avoid going outside when it is very hot, cold, or humid. °· Eat healthy foods. Eat smaller meals more often. Rest before meals. °· Stay active, but remember to also rest. °· Make sure to get all the shots (vaccines) your doctor recommends. Ask your doctor if you need a pneumonia shot. °· Learn and use tips on how to relax. °· Learn and use tips on how to control your breathing as told by your doctor. Try: °¨ Breathing in (inhaling) through your nose for 1 second. Then, pucker your lips and breath out (exhale) through your lips for 2 seconds. °¨ Putting one hand on your belly (abdomen). Breathe in slowly through your nose for 1 second. Your hand on your belly should move out. Pucker your lips and breathe out slowly through your lips. Your hand on your belly  should move in as you breathe out. °· Learn and use controlled coughing to clear thick spit from your lungs. The steps are: °1. Lean your head a little forward. °2. Breathe in deeply. °3. Try to hold your breath for 3 seconds. °4. Keep your mouth slightly open while coughing 2 times. °5. Spit any thick spit out into a tissue. °6. Rest and do the steps again 1 or 2 times as needed. °GET HELP IF: °· You cough up more thick spit than usual. °· There is a change in the color or thickness of the spit. °· It is harder to breathe than usual. °· Your breathing is faster than usual. °GET HELP RIGHT AWAY IF: °· You have shortness of breath while resting. °· You have shortness of breath that stops you from: °¨ Being able to talk. °¨ Doing normal activities. °· You chest hurts for longer than 5 minutes. °· Your skin color is more blue than usual. °· Your pulse oximeter shows that you have low oxygen for longer than 5 minutes. °MAKE SURE YOU: °· Understand these instructions. °· Will watch your condition. °· Will get help right away if you are not doing well or get worse. °  °This information is not intended to replace advice given to you by your health care provider. Make sure you discuss any questions you have with your health care provider. °  °Document Released: 06/09/2007 Document Revised: 01/11/2014 Document Reviewed: 08/17/2012 °[employer]vier Interactive Patient   Education ©2016 [employer] ° °

## 2015-03-30 NOTE — Discharge Summary (Signed)
[place] [facility] [place] NAME: Dawn Donaldson    MR#:  [id number]  DATE OF BIRTH:  [birth year]  DATE OF ADMISSION:  03/24/2015 ADMITTING PHYSICIAN: Hillary Bow, MD  DATE OF DISCHARGE: 03/27/2015 12:06 PM  PRIMARY CARE PHYSICIAN: Lorelee Market, MD    ADMISSION DIAGNOSIS:  COPD exacerbation ([place]) [J44.1]  DISCHARGE DIAGNOSIS:  Active Problems:   COPD exacerbation ([place])   SECONDARY DIAGNOSIS:   Past Medical History  Diagnosis Date  . COPD (chronic obstructive pulmonary disease) ([place])     on nocturnal home o2  . Hypertension   . CAD (coronary artery disease)     s/p PCI  . Diastolic CHF ([place])   . Hyperlipidemia   . GERD (gastroesophageal reflux disease)   . Osteoporosis   . Chronic low back pain   . CVA (cerebral infarction)   . DJD (degenerative joint disease)   . Colon polyps     HOSPITAL COURSE:  65 y.o. female with a known history of COPD, diastolic CHF, hypertension, tobacco abuse admitted for worsening shortness of breath.  * Acute COPD exacerbation with acute on chronic respiratory failure - Improved with steroids + Abx + Nebs - COPD GOLD pt  * Hypertension uncontrolled initially Much better controlled  * Chronic diastolic CHF Well compensated  * Anxiety - stable on Ativan Q 6 hrs prn  DISCHARGE CONDITIONS:   STABLE  CONSULTS OBTAINED:     DRUG ALLERGIES:   Allergies  Allergen Reactions  . Dexlansoprazole Nausea And Vomiting  . Ultram [Tramadol] Itching    DISCHARGE MEDICATIONS:   Discharge Medication List as of 03/27/2015 10:49 AM    START taking these medications   Details  azithromycin (ZITHROMAX) 500 MG tablet Take 1 tablet (500 mg total) by mouth daily., Starting 03/27/2015, Until Discontinued, Normal    predniSONE (STERAPRED UNI-PAK 21 TAB) 10 MG (21) TBPK tablet Take 1 tablet (10 mg total) by mouth daily. 60 mg daily, taper 10 mg daily until done, Starting 03/27/2015, Until  Discontinued, Normal      CONTINUE these medications which have NOT CHANGED   Details  acetaminophen (TYLENOL) 500 MG tablet Take 1,000 mg by mouth every 6 (six) hours as needed for mild pain or headache., Until Discontinued, Historical Med    albuterol (PROVENTIL HFA;VENTOLIN HFA) 108 (90 BASE) MCG/ACT inhaler Inhale 2 puffs into the lungs every 6 (six) hours as needed for wheezing or shortness of breath. , Until Discontinued, Historical Med    clopidogrel (PLAVIX) 75 MG tablet Take 75 mg by mouth daily. , Until Discontinued, Historical Med    COMBIVENT RESPIMAT 20-100 MCG/ACT AERS respimat Inhale 1 puff into the lungs 4 (four) times daily., Starting 05/27/2014, Until Discontinued, Print    esomeprazole (NEXIUM) 40 MG capsule Take 40 mg by mouth 2 (two) times daily., Until Discontinued, Historical Med    Fluticasone-Salmeterol (ADVAIR) 250-50 MCG/DOSE AEPB Inhale 1 puff into the lungs 2 (two) times daily., Until Discontinued, Historical Med    furosemide (LASIX) 40 MG tablet Take 40 mg by mouth every other day. , Until Discontinued, Historical Med    ipratropium-albuterol (DUONEB) 0.5-2.5 (3) MG/3ML SOLN Take 3 mLs by nebulization every 4 (four) hours as needed (for wheezing or shortness of breath)., Until Discontinued, Historical Med    lisinopril (PRINIVIL,ZESTRIL) 20 MG tablet Take 20 mg by mouth daily. , Until Discontinued, Historical Med    nitroGLYCERIN (NITROSTAT) 0.4 MG SL tablet Place 0.4 mg under the tongue every  5 (five) minutes as needed for chest pain. , Until Discontinued, Historical Med    Oxycodone HCl 10 MG TABS Take 1 tablet (10 mg total) by mouth every 4 (four) hours as needed (for pain.)., Starting 02/28/2015, Until Discontinued, Print    simvastatin (ZOCOR) 20 MG tablet Take 20 mg by mouth at bedtime. , Until Discontinued, Historical Med      STOP taking these medications     predniSONE (DELTASONE) 10 MG tablet          DISCHARGE INSTRUCTIONS:    DIET:   Regular diet  DISCHARGE CONDITION:  Good  ACTIVITY:  Activity as tolerated  OXYGEN:  Home Oxygen: Yes.     Oxygen Delivery: 2 liters/min via Patient connected to nasal cannula oxygen  DISCHARGE LOCATION:  home   If you experience worsening of your admission symptoms, develop shortness of breath, life threatening emergency, suicidal or homicidal thoughts you must seek medical attention immediately by calling 911 or calling your MD immediately  if symptoms less severe.  You Must read complete instructions/literature along with all the possible adverse reactions/side effects for all the Medicines you take and that have been prescribed to you. Take any new Medicines after you have completely understood and accpet all the possible adverse reactions/side effects.   Please note  You were cared for by a hospitalist during your hospital stay. If you have any questions about your discharge medications or the care you received while you were in the hospital after you are discharged, you can call the unit and asked to speak with the hospitalist on call if the hospitalist that took care of you is not available. Once you are discharged, your primary care physician will handle any further medical issues. Please note that NO REFILLS for any discharge medications will be authorized once you are discharged, as it is imperative that you return to your primary care physician (or establish a relationship with a primary care physician if you do not have one) for your aftercare needs so that they can reassess your need for medications and monitor your lab values.    On the day of Discharge:  VITAL SIGNS:  Blood pressure 138/75, pulse 81, temperature 98.1 F (36.7 C), temperature source Oral, resp. rate 18, height 5\' 2"  (1.575 m), weight 95.227 kg (209 lb 15 oz), SpO2 98 %.  PHYSICAL EXAMINATION:  GENERAL:  65 y.o.-year-old patient lying in the bed with no acute distress.  EYES: Pupils equal, round,  reactive to light and accommodation. No scleral icterus. Extraocular muscles intact.  HEENT: Head atraumatic, normocephalic. Oropharynx and nasopharynx clear.  NECK:  Supple, no jugular venous distention. No thyroid enlargement, no tenderness.  LUNGS: Normal breath sounds bilaterally, no wheezing, rales,rhonchi or crepitation. No use of accessory muscles of respiration.  CARDIOVASCULAR: S1, S2 normal. No murmurs, rubs, or gallops.  ABDOMEN: Soft, non-tender, non-distended. Bowel sounds present. No organomegaly or mass.  EXTREMITIES: No pedal edema, cyanosis, or clubbing.  NEUROLOGIC: Cranial nerves II through XII are intact. Muscle strength 5/5 in all extremities. Sensation intact. Gait not checked.  PSYCHIATRIC: The patient is alert and oriented x 3.  SKIN: No obvious rash, lesion, or ulcer.  DATA REVIEW:   CBC  Recent Labs Lab 03/26/15 0556  WBC 17.3*  HGB 12.6  HCT 38.3  PLT 239    Chemistries   Recent Labs Lab 03/24/15 1254 03/26/15 0556  NA 138 139  K 3.7 5.0  CL 107 106  CO2 28 30  GLUCOSE  91 125*  BUN 15 22*  CREATININE 0.73 0.81  CALCIUM 9.1 9.1  AST 20  --   ALT 14  --   ALKPHOS 92  --   BILITOT 0.6  --     Microbiology Results  Results for orders placed or performed during the hospital encounter of 09/18/14  Culture, blood (routine x 2)     Status: None   Collection Time: 09/18/14 10:00 AM  Result Value Ref Range Status   Specimen Description BLOOD RIGHT ASSIST CONTROL  Final   Special Requests NONE  Final   Culture NO GROWTH 5 DAYS  Final   Report Status 09/23/2014 FINAL  Final  Culture, blood (routine x 2)     Status: None   Collection Time: 09/18/14 10:08 AM  Result Value Ref Range Status   Specimen Description BLOOD LEFT ASSIST CONTROL  Final   Special Requests BOTTLES DRAWN AEROBIC AND ANAEROBIC 5CC  Final   Culture NO GROWTH 5 DAYS  Final   Report Status 09/23/2014 FINAL  Final   Follow-up Information    Follow up with Lorelee Market,  MD. Daphane Shepherd on 04/03/2015.   Specialty:  Family Medicine   Why:  [facility] Discharge F/UP at [place] information:   [facility] [place] [address] [phone]       Follow up with Wallene Huh, MD. Go on 04/10/2015.   Specialty:  Specialist   Why:  [facility] Discharge F/UP at 245pm.    Contact information:   [place] [address] [phone]      .  Management plans discussed with the patient, family and they are in agreement.  CODE STATUS: FULL CODE  TOTAL TIME TAKING CARE OF THIS PATIENT: 45 minutes.    [facility] Lubna Stegeman M.D on 03/30/2015 at 10:24 AM  Between 7am to 6pm - Pager - [phone]  After 6pm go to [email or web address] - password [email or web address] [place] [facility]  Office  [phone]  CC: Primary care physician; Lorelee Market, MD   Note: This dictation was prepared with Dragon dictation along with smaller phrase technology. Any transcriptional errors that result from this process are unintentional.

## 2015-04-29 ENCOUNTER — Encounter: Payer: Self-pay | Admitting: Emergency Medicine

## 2015-04-29 ENCOUNTER — Inpatient Hospital Stay
Admission: EM | Admit: 2015-04-29 | Discharge: 2015-05-02 | DRG: 190 | Disposition: A | Discharge status: Home / Self Care | Payer: Medicare Other | Attending: Internal Medicine | Admitting: Internal Medicine

## 2015-04-29 ENCOUNTER — Emergency Department: Payer: Medicare Other

## 2015-04-29 DIAGNOSIS — Z87891 Personal history of nicotine dependence: Secondary | ICD-10-CM

## 2015-04-29 DIAGNOSIS — Z888 Allergy status to other drugs, medicaments and biological substances status: Secondary | ICD-10-CM

## 2015-04-29 DIAGNOSIS — R0602 Shortness of breath: Secondary | ICD-10-CM

## 2015-04-29 DIAGNOSIS — M810 Age-related osteoporosis without current pathological fracture: Secondary | ICD-10-CM | POA: Diagnosis present

## 2015-04-29 DIAGNOSIS — K219 Gastro-esophageal reflux disease without esophagitis: Secondary | ICD-10-CM | POA: Diagnosis present

## 2015-04-29 DIAGNOSIS — I2510 Atherosclerotic heart disease of native coronary artery without angina pectoris: Secondary | ICD-10-CM | POA: Diagnosis present

## 2015-04-29 DIAGNOSIS — M545 Low back pain: Secondary | ICD-10-CM | POA: Diagnosis present

## 2015-04-29 DIAGNOSIS — Z8249 Family history of ischemic heart disease and other diseases of the circulatory system: Secondary | ICD-10-CM

## 2015-04-29 DIAGNOSIS — Z9861 Coronary angioplasty status: Secondary | ICD-10-CM

## 2015-04-29 DIAGNOSIS — Z7952 Long term (current) use of systemic steroids: Secondary | ICD-10-CM

## 2015-04-29 DIAGNOSIS — I5032 Chronic diastolic (congestive) heart failure: Secondary | ICD-10-CM | POA: Diagnosis present

## 2015-04-29 DIAGNOSIS — Z9981 Dependence on supplemental oxygen: Secondary | ICD-10-CM | POA: Diagnosis not present

## 2015-04-29 DIAGNOSIS — E785 Hyperlipidemia, unspecified: Secondary | ICD-10-CM | POA: Diagnosis present

## 2015-04-29 DIAGNOSIS — Z792 Long term (current) use of antibiotics: Secondary | ICD-10-CM

## 2015-04-29 DIAGNOSIS — I110 Hypertensive heart disease with heart failure: Secondary | ICD-10-CM | POA: Diagnosis present

## 2015-04-29 DIAGNOSIS — Z886 Allergy status to analgesic agent status: Secondary | ICD-10-CM

## 2015-04-29 DIAGNOSIS — Z7902 Long term (current) use of antithrombotics/antiplatelets: Secondary | ICD-10-CM | POA: Diagnosis not present

## 2015-04-29 DIAGNOSIS — Z8673 Personal history of transient ischemic attack (TIA), and cerebral infarction without residual deficits: Secondary | ICD-10-CM | POA: Diagnosis not present

## 2015-04-29 DIAGNOSIS — Z79899 Other long term (current) drug therapy: Secondary | ICD-10-CM

## 2015-04-29 DIAGNOSIS — Z86010 Personal history of colonic polyps: Secondary | ICD-10-CM

## 2015-04-29 DIAGNOSIS — I739 Peripheral vascular disease, unspecified: Secondary | ICD-10-CM | POA: Diagnosis present

## 2015-04-29 DIAGNOSIS — J441 Chronic obstructive pulmonary disease with (acute) exacerbation: Secondary | ICD-10-CM | POA: Diagnosis present

## 2015-04-29 DIAGNOSIS — G8929 Other chronic pain: Secondary | ICD-10-CM | POA: Diagnosis present

## 2015-04-29 DIAGNOSIS — J9620 Acute and chronic respiratory failure, unspecified whether with hypoxia or hypercapnia: Secondary | ICD-10-CM | POA: Diagnosis present

## 2015-04-29 DIAGNOSIS — Z79891 Long term (current) use of opiate analgesic: Secondary | ICD-10-CM | POA: Diagnosis not present

## 2015-04-29 DIAGNOSIS — Z7951 Long term (current) use of inhaled steroids: Secondary | ICD-10-CM

## 2015-04-29 LAB — CBC WITH DIFFERENTIAL/PLATELET
BAND NEUTROPHILS: 0 %
BASOS ABS: 0 10*3/uL (ref 0–0.1)
BASOS PCT: 0 %
Blasts: 0 %
EOS ABS: 0.3 10*3/uL (ref 0–0.7)
Eosinophils Relative: 2 %
HCT: 42 % (ref 35.0–47.0)
Hemoglobin: 14.1 g/dL (ref 12.0–16.0)
Lymphocytes Relative: 46 %
Lymphs Abs: 5.8 10*3/uL — ABNORMAL HIGH (ref 1.0–3.6)
MCH: 31.9 pg (ref 26.0–34.0)
MCHC: 33.5 g/dL (ref 32.0–36.0)
MCV: 95.1 fL (ref 80.0–100.0)
METAMYELOCYTES PCT: 0 %
MONO ABS: 0.8 10*3/uL (ref 0.2–0.9)
MYELOCYTES: 0 %
Monocytes Relative: 6 %
NEUTROS PCT: 46 %
Neutro Abs: 5.8 10*3/uL (ref 1.4–6.5)
Other: 0 %
PLATELETS: 244 10*3/uL (ref 150–440)
PROMYELOCYTES ABS: 0 %
RBC: 4.41 MIL/uL (ref 3.80–5.20)
RDW: 13.9 % (ref 11.5–14.5)
WBC: 12.7 10*3/uL — ABNORMAL HIGH (ref 3.6–11.0)
nRBC: 0 /100 WBC

## 2015-04-29 LAB — BASIC METABOLIC PANEL
Anion gap: 8 (ref 5–15)
BUN: 15 mg/dL (ref 6–20)
CALCIUM: 9.4 mg/dL (ref 8.9–10.3)
CO2: 28 mmol/L (ref 22–32)
CREATININE: 0.76 mg/dL (ref 0.44–1.00)
Chloride: 104 mmol/L (ref 101–111)
Glucose, Bld: 110 mg/dL — ABNORMAL HIGH (ref 65–99)
Potassium: 3.9 mmol/L (ref 3.5–5.1)
Sodium: 140 mmol/L (ref 135–145)

## 2015-04-29 LAB — TROPONIN I

## 2015-04-29 MED ORDER — CLOPIDOGREL BISULFATE 75 MG PO TABS
75.0000 mg | ORAL_TABLET | Freq: Every evening | ORAL | Status: DC
  Administered 2015-04-29 – 2015-05-01 (×3): 75 mg via ORAL
  Filled 2015-04-29 (×4): qty 1

## 2015-04-29 MED ORDER — IPRATROPIUM-ALBUTEROL 0.5-2.5 (3) MG/3ML IN SOLN
3.0000 mL | Freq: Four times a day (QID) | RESPIRATORY_TRACT | Status: DC
  Administered 2015-04-30 – 2015-05-02 (×10): 3 mL via RESPIRATORY_TRACT
  Filled 2015-04-29 (×9): qty 3

## 2015-04-29 MED ORDER — ACETAMINOPHEN 650 MG RE SUPP: 650.0000 mg | Freq: Four times a day (QID) | RECTAL | Status: DC | PRN

## 2015-04-29 MED ORDER — ACETAMINOPHEN 325 MG PO TABS: 650.0000 mg | ORAL_TABLET | Freq: Four times a day (QID) | ORAL | Status: DC | PRN

## 2015-04-29 MED ORDER — NITROGLYCERIN 0.4 MG SL SUBL: 0.4000 mg | SUBLINGUAL_TABLET | SUBLINGUAL | Status: DC | PRN

## 2015-04-29 MED ORDER — ALBUTEROL SULFATE (2.5 MG/3ML) 0.083% IN NEBU
5.0000 mg | INHALATION_SOLUTION | Freq: Once | RESPIRATORY_TRACT | Status: AC
  Administered 2015-04-29: 5 mg via RESPIRATORY_TRACT
  Filled 2015-04-29: qty 6

## 2015-04-29 MED ORDER — ENOXAPARIN SODIUM 40 MG/0.4ML ~~LOC~~ SOLN: 40.0000 mg | SUBCUTANEOUS | Status: DC

## 2015-04-29 MED ORDER — LISINOPRIL 20 MG PO TABS
20.0000 mg | ORAL_TABLET | Freq: Every evening | ORAL | Status: DC
  Administered 2015-04-29 – 2015-05-01 (×3): 20 mg via ORAL
  Filled 2015-04-29: qty 1
  Filled 2015-04-29: qty 2
  Filled 2015-04-29: qty 1

## 2015-04-29 MED ORDER — BISACODYL 5 MG PO TBEC: 5.0000 mg | DELAYED_RELEASE_TABLET | Freq: Every day | ORAL | Status: DC | PRN

## 2015-04-29 MED ORDER — HYDROCOD POLST-CPM POLST ER 10-8 MG/5ML PO SUER
5.0000 mL | Freq: Two times a day (BID) | ORAL | Status: DC | PRN
  Administered 2015-04-29 – 2015-05-02 (×5): 5 mL via ORAL
  Filled 2015-04-29 (×5): qty 5

## 2015-04-29 MED ORDER — METHYLPREDNISOLONE SODIUM SUCC 125 MG IJ SOLR
60.0000 mg | Freq: Four times a day (QID) | INTRAMUSCULAR | Status: DC
  Administered 2015-04-30 – 2015-05-02 (×10): 60 mg via INTRAVENOUS
  Filled 2015-04-29 (×10): qty 2

## 2015-04-29 MED ORDER — FUROSEMIDE 40 MG PO TABS
40.0000 mg | ORAL_TABLET | ORAL | Status: DC
  Administered 2015-04-29 – 2015-05-01 (×2): 40 mg via ORAL
  Filled 2015-04-29 (×2): qty 1

## 2015-04-29 MED ORDER — HYDROCHLOROTHIAZIDE 25 MG PO TABS
12.5000 mg | ORAL_TABLET | Freq: Every evening | ORAL | Status: DC
  Administered 2015-04-29 – 2015-05-01 (×3): 12.5 mg via ORAL
  Filled 2015-04-29 (×3): qty 1

## 2015-04-29 MED ORDER — IPRATROPIUM-ALBUTEROL 0.5-2.5 (3) MG/3ML IN SOLN
9.0000 mL | Freq: Once | RESPIRATORY_TRACT | Status: AC
  Administered 2015-04-29: 9 mL via RESPIRATORY_TRACT
  Filled 2015-04-29: qty 9

## 2015-04-29 MED ORDER — TRAZODONE HCL 50 MG PO TABS: 25.0000 mg | ORAL_TABLET | Freq: Every evening | ORAL | Status: DC | PRN

## 2015-04-29 MED ORDER — OXYCODONE HCL 5 MG PO TABS
10.0000 mg | ORAL_TABLET | Freq: Four times a day (QID) | ORAL | Status: DC | PRN
  Administered 2015-04-29: 10 mg via ORAL
  Filled 2015-04-29: qty 2

## 2015-04-29 MED ORDER — DEXTROSE 5 % IV SOLN
500.0000 mg | INTRAVENOUS | Status: DC
  Administered 2015-04-30 – 2015-05-01 (×2): 500 mg via INTRAVENOUS
  Filled 2015-04-29 (×4): qty 500

## 2015-04-29 MED ORDER — THEOPHYLLINE ER 400 MG PO TB24: 100.0000 mg | ORAL_TABLET | Freq: Every evening | ORAL | Status: DC

## 2015-04-29 MED ORDER — THEOPHYLLINE ER 100 MG PO TB12
100.0000 mg | ORAL_TABLET | Freq: Once | ORAL | Status: AC
  Administered 2015-04-29: 100 mg via ORAL
  Filled 2015-04-29: qty 1

## 2015-04-29 MED ORDER — ACETAMINOPHEN 500 MG PO TABS: 1000.0000 mg | ORAL_TABLET | Freq: Four times a day (QID) | ORAL | Status: DC | PRN

## 2015-04-29 MED ORDER — MOMETASONE FURO-FORMOTEROL FUM 200-5 MCG/ACT IN AERO
2.0000 | INHALATION_SPRAY | Freq: Two times a day (BID) | RESPIRATORY_TRACT | Status: DC
  Administered 2015-04-29 – 2015-05-02 (×6): 2 via RESPIRATORY_TRACT
  Filled 2015-04-29: qty 8.8

## 2015-04-29 MED ORDER — PANTOPRAZOLE SODIUM 40 MG PO TBEC
40.0000 mg | DELAYED_RELEASE_TABLET | Freq: Every day | ORAL | Status: DC
  Administered 2015-04-29 – 2015-05-02 (×4): 40 mg via ORAL
  Filled 2015-04-29 (×4): qty 1

## 2015-04-29 MED ORDER — ONDANSETRON HCL 4 MG/2ML IJ SOLN: 4.0000 mg | Freq: Four times a day (QID) | INTRAMUSCULAR | Status: DC | PRN

## 2015-04-29 MED ORDER — SIMVASTATIN 20 MG PO TABS
20.0000 mg | ORAL_TABLET | Freq: Every evening | ORAL | Status: DC
  Administered 2015-04-29 – 2015-05-01 (×3): 20 mg via ORAL
  Filled 2015-04-29 (×3): qty 1

## 2015-04-29 MED ORDER — ONDANSETRON HCL 4 MG PO TABS
4.0000 mg | ORAL_TABLET | Freq: Four times a day (QID) | ORAL | Status: DC | PRN
  Administered 2015-04-29 – 2015-05-02 (×5): 4 mg via ORAL
  Filled 2015-04-29 (×5): qty 1

## 2015-04-29 MED ORDER — ALBUTEROL SULFATE (2.5 MG/3ML) 0.083% IN NEBU
7.5000 mg | INHALATION_SOLUTION | Freq: Once | RESPIRATORY_TRACT | Status: AC
  Administered 2015-04-29: 7.5 mg via RESPIRATORY_TRACT
  Filled 2015-04-29: qty 9

## 2015-04-29 MED ORDER — MAGNESIUM SULFATE 2 GM/50ML IV SOLN: 2.0000 g | Freq: Once | INTRAVENOUS | Status: DC

## 2015-04-29 MED ORDER — DOCUSATE SODIUM 100 MG PO CAPS
100.0000 mg | ORAL_CAPSULE | Freq: Two times a day (BID) | ORAL | Status: DC
  Administered 2015-04-29 – 2015-05-02 (×6): 100 mg via ORAL
  Filled 2015-04-29 (×6): qty 1

## 2015-04-29 MED ORDER — IPRATROPIUM-ALBUTEROL 0.5-2.5 (3) MG/3ML IN SOLN
3.0000 mL | RESPIRATORY_TRACT | Status: DC | PRN
  Filled 2015-04-29 (×2): qty 3

## 2015-04-29 MED ORDER — DEXTROSE 5 % IV SOLN
1.0000 g | INTRAVENOUS | Status: DC
  Administered 2015-04-29 – 2015-05-01 (×3): 1 g via INTRAVENOUS
  Filled 2015-04-29 (×4): qty 10

## 2015-04-29 MED ORDER — DEXTROSE 5 % IV SOLN
500.0000 mg | Freq: Once | INTRAVENOUS | Status: AC
  Administered 2015-04-29: 500 mg via INTRAVENOUS

## 2015-04-29 MED ORDER — METHYLPREDNISOLONE SODIUM SUCC 125 MG IJ SOLR
125.0000 mg | Freq: Once | INTRAMUSCULAR | Status: AC
  Administered 2015-04-29: 125 mg via INTRAVENOUS
  Filled 2015-04-29: qty 2

## 2015-04-29 NOTE — Progress Notes (Signed)
MD Dr. Melynda Ripple notified of pt's request for pain and cough medication. Orders received for prn oxycodone and tussionex. Orders also placed for a cardiac diet. Rachael Fee, RN

## 2015-04-29 NOTE — H&P (Signed)
[place] [facility] [place] NAME: Dawn Donaldson    MR#:  [id number]  DATE OF BIRTH:  [birth year]  DATE OF ADMISSION:  04/29/2015  PRIMARY CARE PHYSICIAN: Lorelee Market, MD   REQUESTING/REFERRING PHYSICIAN: Dr. Dineen Kid  CHIEF COMPLAINT: Shortness of breath    Chief Complaint  Patient presents with  . Shortness of Breath    HISTORY OF PRESENT ILLNESS:  Dawn Donaldson  is a 65 y.o. female with a known history of hypertension, diastolic heart failure, COPD comes in because of shortness of breath getting progressively worse for the past 3 days associated with cough and green phlegm. Patient tried nebulizers at home without relief. Patient has severe shortness of breath on arrival, requiring BiPAP. She received BiPAP in the emergency room for 2 hours. Patient also received series of nebulizers, 25 mg of IV Solu-Medrol. Patient says that she doesn't feel well after the nebulizer and steroid also. Oxygen saturation on 2 L 96%. Patient uses oxygen at night but recently last few days she has to use during daytime also. No chest pain.  PAST MEDICAL HISTORY:   Past Medical History  Diagnosis Date  . COPD (chronic obstructive pulmonary disease) ([place])     on nocturnal home o2  . Hypertension   . CAD (coronary artery disease)     s/p PCI  . Diastolic CHF ([place])   . Hyperlipidemia   . GERD (gastroesophageal reflux disease)   . Osteoporosis   . Chronic low back pain   . CVA (cerebral infarction)   . DJD (degenerative joint disease)   . Colon polyps     PAST SURGICAL HISTOIRY:   Past Surgical History  Procedure Laterality Date  . Carotid endarterectomy      Right  . Cholecystectomy    . Appendectomy    . Back surgery    . Partial hysterectomy      SOCIAL HISTORY:   Social History  Substance Use Topics  . Smoking status: Former Smoker -- 0.10 packs/day    Types: Cigarettes  . Smokeless tobacco: Never Used  . Alcohol Use: No    FAMILY  HISTORY:   Family History  Problem Relation Age of Onset  . Atrial fibrillation Mother   . CAD Father     DRUG ALLERGIES:   Allergies  Allergen Reactions  . Dexlansoprazole Nausea And Vomiting  . Ultram [Tramadol] Itching    REVIEW OF SYSTEMS:  CONSTITUTIONAL: No fever, fatigue or weakness.  EYES: No blurred or double vision.  EARS, NOSE, AND THROAT: No tinnitus or ear pain.  RESPIRATORY: cough, shortness of breath. : No chest pain, orthopnea, edema.  GASTROINTESTINAL: No nausea, vomiting, diarrhea or abdominal pain.  GENITOURINARY: No dysuria, hematuria.  ENDOCRINE: No polyuria, nocturia,  HEMATOLOGY: No anemia, easy bruising or bleeding SKIN: No rash or lesion. MUSCULOSKELETAL: No joint pain or arthritis.   NEUROLOGIC: No tingling, numbness, weakness.  PSYCHIATRY: No anxiety or depression.   MEDICATIONS AT HOME:   Prior to Admission medications   Medication Sig Start Date End Date Taking? Authorizing Provider  acetaminophen (TYLENOL) 500 MG tablet Take 1,000 mg by mouth every 6 (six) hours as needed for mild pain or headache.   Yes Historical Provider, MD  albuterol (PROVENTIL HFA;VENTOLIN HFA) 108 (90 BASE) MCG/ACT inhaler Inhale 2 puffs into the lungs every 6 (six) hours as needed for wheezing or shortness of breath.    Yes Historical Provider, MD  clopidogrel (PLAVIX) 75 MG tablet Take 75  mg by mouth every evening.    Yes Historical Provider, MD  COMBIVENT RESPIMAT 20-100 MCG/ACT AERS respimat Inhale 1 puff into the lungs 4 (four) times daily. 05/27/14  Yes Gladstone Lighter, MD  esomeprazole (NEXIUM) 40 MG capsule Take 40 mg by mouth 2 (two) times daily.   Yes Historical Provider, MD  Fluticasone-Salmeterol (ADVAIR) 250-50 MCG/DOSE AEPB Inhale 1 puff into the lungs 2 (two) times daily.   Yes Historical Provider, MD  furosemide (LASIX) 40 MG tablet Take 40 mg by mouth every other day.    Yes Historical Provider, MD  hydrochlorothiazide (HYDRODIURIL) 12.5 MG tablet Take  12.5 mg by mouth every evening.   Yes Historical Provider, MD  ipratropium-albuterol (DUONEB) 0.5-2.5 (3) MG/3ML SOLN Take 3 mLs by nebulization every 4 (four) hours as needed (for wheezing and/or shortness of breath).    Yes Historical Provider, MD  lisinopril (PRINIVIL,ZESTRIL) 20 MG tablet Take 20 mg by mouth every evening.    Yes Historical Provider, MD  nitroGLYCERIN (NITROSTAT) 0.4 MG SL tablet Place 0.4 mg under the tongue every 5 (five) minutes as needed for chest pain.    Yes Historical Provider, MD  Oxycodone HCl 10 MG TABS Take 1 tablet (10 mg total) by mouth every 4 (four) hours as needed (for pain.). 02/28/15  Yes Sital Mody, MD  simvastatin (ZOCOR) 20 MG tablet Take 20 mg by mouth every evening.    Yes Historical Provider, MD  theophylline (UNIPHYL) 400 MG 24 hr tablet Take 100 mg by mouth every evening.   Yes Historical Provider, MD  azithromycin (ZITHROMAX) 500 MG tablet Take 1 tablet (500 mg total) by mouth daily. Patient not taking: Reported on 04/29/2015 03/27/15   Max Sane, MD  predniSONE (STERAPRED UNI-PAK 21 TAB) 10 MG (21) TBPK tablet Take 1 tablet (10 mg total) by mouth daily. 60 mg daily, taper 10 mg daily until done Patient not taking: Reported on 04/29/2015 03/27/15   Max Sane, MD      VITAL SIGNS:  Blood pressure 214/116, pulse 108, temperature 98.7 F (37.1 C), temperature source Oral, resp. rate 23, height 5\' 3"  (1.6 m), weight 90.719 kg (200 lb), SpO2 98 %.  PHYSICAL EXAMINATION:  GENERAL:  65 y.o.-year-old patient lying in the bed with no acute distress.  EYES: Pupils equal, round, reactive to light and accommodation. No scleral icterus. Extraocular muscles intact.  HEENT: Head atraumatic, normocephalic. Oropharynx and nasopharynx clear.  NECK:  Supple, no jugular venous distention. No thyroid enlargement, no tenderness.  LUNGS: Diffuse expiratory wheeze in all lung fields. Patient is not using accessory muscles of respiration at this time.   NO rales,rhonchi or  crepitation. No use of accessory muscles of respiration.  CARDIOVASCULAR: S1, S2 normal. No murmurs, rubs, or gallops.  ABDOMEN: Soft, nontender, nondistended. Bowel sounds present. No organomegaly or mass.  EXTREMITIES: No pedal edema, cyanosis, or clubbing.  NEUROLOGIC: Cranial nerves II through XII are intact. Muscle strength 5/5 in all extremities. Sensation intact. Gait not checked.  PSYCHIATRIC: The patient is alert and oriented x 3.  SKIN: No obvious rash, lesion, or ulcer.   LABORATORY PANEL:   CBC  Recent Labs Lab 04/29/15 1654  WBC 12.7*  HGB 14.1  HCT 42.0  PLT 244   ------------------------------------------------------------------------------------------------------------------  Chemistries   Recent Labs Lab 04/29/15 1654  NA 140  K 3.9  CL 104  CO2 28  GLUCOSE 110*  BUN 15  CREATININE 0.76  CALCIUM 9.4   ------------------------------------------------------------------------------------------------------------------  Cardiac Enzymes  Recent Labs  Lab 04/29/15 1654  TROPONINI <0.03   ------------------------------------------------------------------------------------------------------------------  RADIOLOGY:  Dg Chest Port 1 View  04/29/2015  CLINICAL DATA:  65 year old female with COPD. Increasing shortness of breath. EXAM: PORTABLE CHEST 1 VIEW COMPARISON:  03/24/2015. FINDINGS: No infiltrate, congestive heart failure or pneumothorax. No plain film evidence pulmonary malignancy. Heart size top-normal. IMPRESSION: No acute abnormality noted. Electronically Signed   By: Genia Del M.D.   On: 04/29/2015 17:54    EKG:   Orders placed or performed during the hospital encounter of 04/29/15  . ED EKG  . ED EKG  . EKG 12-Lead  . EKG 12-Lead   normal sinus rhythm at 96 bpm, no ST T changes.   IMPRESSION AND PLAN:   #1 acute on chronic respiratory failure secondary to COPD exacerbation: Patient chest x-ray is negative for pneumonia. Initially  required BiPAP now she is off the BiPAP, oxygenating well on 2 L saturating 96%. Patient is admitted to telemetry at this time, continue IV Solu-Medrol, DuoNeb's, empiric antibiotics with Rocephin, Zithromax and follow clinical course. #2 , malignant hypertension blood pressure is elevated secondary to respiratory distress. Continue her Lasix, Toprol, USE hydralazine as needed 20 mg every 6 hours IV  #3. History of carotid stenosis and stroke: Patient is on Plavix, statins continue them. #4 history of COPD patient takes her theophylline, Advair continue them.  GI, DVT prophylaxis.  discussed with ED provider. Management plans discussed with the patient, family and they are in agreement.  CODE STATUS: full   TOTAL TIME TAKING CARE OF THIS PATIENT: 55 minutes.    Epifanio Lesches M.D on 04/29/2015 at 7:05 PM  Between 7am to 6pm - Pager - [phone]  After 6pm go to [email or web address] - password [email or web address] [place] [facility]  Office  [phone]  CC: Primary care physician; Lorelee Market, MD  Note: This dictation was prepared with Dragon dictation along with smaller phrase technology. Any transcriptional errors that result from this process are unintentional.

## 2015-04-29 NOTE — ED Provider Notes (Signed)
[facility] Emergency Department Provider Note  ____________________________________________  Time seen: Approximately 5:10 PM  I have reviewed the triage vital signs and the nursing notes.   HISTORY  Chief Complaint Shortness of Breath   HPI Dawn Donaldson is a 65 y.o. female with a history of COPD with her last admission in January of this year was presenting to the emergency department today with 3 days of worsening shortness of breath and a cough. She says the cough is intermittently productive of sputum. Denies any fever home. Denies any pain. Denies any recent illness or sick contact. Denies any known provoking factor for her COPD exacerbation. Says that "they just come on."Says that she is unable to walk more than several steps at this point before becoming severely short of breath.   Past Medical History  Diagnosis Date  . COPD (chronic obstructive pulmonary disease) ([place])     on nocturnal home o2  . Hypertension   . CAD (coronary artery disease)     s/p PCI  . Diastolic CHF ([place])   . Hyperlipidemia   . GERD (gastroesophageal reflux disease)   . Osteoporosis   . Chronic low back pain   . CVA (cerebral infarction)   . DJD (degenerative joint disease)   . Colon polyps     Patient Active Problem List   Diagnosis Date Noted  . COPD exacerbation ([place]) 12/28/2014  . COPD (chronic obstructive pulmonary disease) ([place]) 05/23/2014    Past Surgical History  Procedure Laterality Date  . Carotid endarterectomy      Right  . Cholecystectomy    . Appendectomy    . Back surgery    . Partial hysterectomy      Current Outpatient Rx  Name  Route  Sig  Dispense  Refill  . acetaminophen (TYLENOL) 500 MG tablet   Oral   Take 1,000 mg by mouth every 6 (six) hours as needed for mild pain or headache.         . albuterol (PROVENTIL HFA;VENTOLIN HFA) 108 (90 BASE) MCG/ACT inhaler   Inhalation   Inhale 2 puffs into the lungs every 6 (six) hours as  needed for wheezing or shortness of breath.          . clopidogrel (PLAVIX) 75 MG tablet   Oral   Take 75 mg by mouth every evening.          . COMBIVENT RESPIMAT 20-100 MCG/ACT AERS respimat   Inhalation   Inhale 1 puff into the lungs 4 (four) times daily.   1 Inhaler   2     Dispense as written.   Marland Kitchen esomeprazole (NEXIUM) 40 MG capsule   Oral   Take 40 mg by mouth 2 (two) times daily.         . Fluticasone-Salmeterol (ADVAIR) 250-50 MCG/DOSE AEPB   Inhalation   Inhale 1 puff into the lungs 2 (two) times daily.         . furosemide (LASIX) 40 MG tablet   Oral   Take 40 mg by mouth every other day.          . hydrochlorothiazide (HYDRODIURIL) 12.5 MG tablet   Oral   Take 12.5 mg by mouth every evening.         Marland Kitchen ipratropium-albuterol (DUONEB) 0.5-2.5 (3) MG/3ML SOLN   Nebulization   Take 3 mLs by nebulization every 4 (four) hours as needed (for wheezing and/or shortness of breath).          Marland Kitchen  lisinopril (PRINIVIL,ZESTRIL) 20 MG tablet   Oral   Take 20 mg by mouth every evening.          . nitroGLYCERIN (NITROSTAT) 0.4 MG SL tablet   Sublingual   Place 0.4 mg under the tongue every 5 (five) minutes as needed for chest pain.          . Oxycodone HCl 10 MG TABS   Oral   Take 1 tablet (10 mg total) by mouth every 4 (four) hours as needed (for pain.).   20 tablet   0   . simvastatin (ZOCOR) 20 MG tablet   Oral   Take 20 mg by mouth every evening.          . theophylline (UNIPHYL) 400 MG 24 hr tablet   Oral   Take 100 mg by mouth every evening.         Marland Kitchen azithromycin (ZITHROMAX) 500 MG tablet   Oral   Take 1 tablet (500 mg total) by mouth daily. Patient not taking: Reported on 04/29/2015   5 tablet   0   . predniSONE (STERAPRED UNI-PAK 21 TAB) 10 MG (21) TBPK tablet   Oral   Take 1 tablet (10 mg total) by mouth daily. 60 mg daily, taper 10 mg daily until done Patient not taking: Reported on 04/29/2015   21 tablet   0      Allergies Dexlansoprazole and Ultram  Family History  Problem Relation Age of Onset  . Atrial fibrillation Mother   . CAD Father     Social History Social History  Substance Use Topics  . Smoking status: Former Smoker -- 0.10 packs/day    Types: Cigarettes  . Smokeless tobacco: Never Used  . Alcohol Use: No    Review of Systems Constitutional: No fever/chills Eyes: No visual changes. ENT: No sore throat. Cardiovascular: Denies chest pain. Respiratory: As above Gastrointestinal: No abdominal pain.  No nausea, no vomiting.  No diarrhea.  No constipation. Genitourinary: Negative for dysuria. Musculoskeletal: Negative for back pain. Skin: Negative for rash. Neurological: Negative for headaches, focal weakness or numbness.  10-point ROS otherwise negative.  ____________________________________________   PHYSICAL EXAM:  VITAL SIGNS: ED Triage Vitals  Enc Vitals Group     BP 04/29/15 1642 214/116 mmHg     Pulse Rate 04/29/15 1642 108     Resp 04/29/15 1642 22     Temp 04/29/15 1642 98.7 F (37.1 C)     Temp Source 04/29/15 1642 Oral     SpO2 04/29/15 1642 96 %     Weight 04/29/15 1642 200 lb (90.719 kg)     Height 04/29/15 1642 5\' 3"  (1.6 m)     Head Cir --      Peak Flow --      Pain Score 04/29/15 1644 3     Pain Loc --      Pain Edu? --      Excl. in [place]? --     Constitutional: Alert and oriented. Speaks in 4-5 word sentences. Eyes: Conjunctivae are normal. PERRL. EOMI. Head: Atraumatic. Nose: No congestion/rhinnorhea. Mouth/Throat: Mucous membranes are moist.   Neck: No stridor.   Cardiovascular: Tachycardic, regular rhythm. Grossly normal heart sounds.  Good peripheral circulation. Respiratory: Labored respirations, using accessory muscles. Coarse wheezes throughout with a prolonged expiratory phase. Gastrointestinal: Soft and nontender. No distention. No CVA tenderness. Musculoskeletal: No lower extremity tenderness nor edema.  No joint  effusions. Neurologic:  Normal speech and language. No gross focal neurologic  deficits are appreciated. Skin:  Skin is warm, dry and intact. No rash noted. Psychiatric: Mood and affect are normal. Speech and behavior are normal.  ____________________________________________   LABS (all labs ordered are listed, but only abnormal results are displayed)  Labs Reviewed  CBC WITH DIFFERENTIAL/PLATELET - Abnormal; Notable for the following:    WBC 12.7 (*)    All other components within normal limits  BASIC METABOLIC PANEL - Abnormal; Notable for the following:    Glucose, Bld 110 (*)    All other components within normal limits  TROPONIN I   ____________________________________________  EKG  ED ECG REPORT I, Doran Stabler, the attending physician, personally viewed and interpreted this ECG.   Date: 04/29/2015  EKG Time: 1654  Rate: 96  Rhythm: sinus tachycardia  Axis: Normal  Intervals:none  ST&T Change: No ST segment elevation or depression. No abnormal T-wave inversion.  ____________________________________________  RADIOLOGY  DG Chest Port 1 View (Final result) Result time: 04/29/15 17:54:13   Procedure changed from [facility] Chest 2 View      Final result by Rad Results In Interface (04/29/15 17:54:13)   Narrative:   CLINICAL DATA: 65 year old female with COPD. Increasing shortness of breath.  EXAM: PORTABLE CHEST 1 VIEW  COMPARISON: 03/24/2015.  FINDINGS: No infiltrate, congestive heart failure or pneumothorax.  No plain film evidence pulmonary malignancy.  Heart size top-normal.  IMPRESSION: No acute abnormality noted.   Electronically Signed By: Genia Del M.D. On: 04/29/2015 17:54       ____________________________________________   PROCEDURES  CRITICAL CARE Performed by: Doran Stabler   Total critical care time: 35 minutes  Critical care time was exclusive of separately billable procedures and treating other  patients.  Critical care was necessary to treat or prevent imminent or life-threatening deterioration.  Critical care was time spent personally by me on the following activities: development of treatment plan with patient and/or surrogate as well as nursing, discussions with consultants, evaluation of patient's response to treatment, examination of patient, obtaining history from patient or surrogate, ordering and performing treatments and interventions, ordering and review of laboratory studies, ordering and review of radiographic studies, pulse oximetry and re-evaluation of patient's condition.  Patient placed on BiPAP because of respiratory distress. ____________________________________________   INITIAL IMPRESSION / ASSESSMENT AND PLAN / ED COURSE  Pertinent labs & imaging results that were available during my care of the patient were reviewed by me and considered in my medical decision making (see chart for details).  ----------------------------------------- 6:50 PM on 04/29/2015 -----------------------------------------  Patient with decreased respiratory rate on BiPAP at 15 breath from minute. Says that she feels improved. We will discontinue the BiPAP. Will give albuterol nebs. Patient still with prolonged expiration phase and improved but still with mildly labored respirations and wheezing throughout. We'll admit to the hospital. Signed out to Dr. Vianne Bulls.   ____________________________________________   FINAL CLINICAL IMPRESSION(S) / ED DIAGNOSES  Final diagnoses:  Shortness of breath   COPD exacerbation.    Orbie Pyo, MD 04/29/15 [phone]

## 2015-04-29 NOTE — ED Notes (Signed)
Reports COPD, today increased sob.  Arrived on home O2, states normally only uses it at night

## 2015-04-29 NOTE — ED Notes (Signed)
Patient transported to X-ray 

## 2015-04-29 NOTE — Progress Notes (Signed)
Pt admitted to room 238. VSS, A&Ox4, pt c/o nausea, pain, and cough. Zofran given, MD paged for pain and cough medication, awaiting response. Pt oriented to safety, call bell, and telephone. Instructed to call nursing to get out of bed if feeling dizzy/light headed. Pt verbalized understanding and signed safety contract. Skin assessed and telemetry verified with Kristine Garbe, RN. RN will continue to monitor and treat per MD orders. Rachael Fee, RN

## 2015-04-30 LAB — BASIC METABOLIC PANEL
Anion gap: 13 (ref 5–15)
BUN: 14 mg/dL (ref 6–20)
CHLORIDE: 100 mmol/L — AB (ref 101–111)
CO2: 25 mmol/L (ref 22–32)
Calcium: 9.1 mg/dL (ref 8.9–10.3)
Creatinine, Ser: 0.76 mg/dL (ref 0.44–1.00)
GFR calc Af Amer: >60 mL/min (ref >60)
Glucose, Bld: 169 mg/dL — ABNORMAL HIGH (ref 65–99)
POTASSIUM: 3.6 mmol/L (ref 3.5–5.1)
SODIUM: 138 mmol/L (ref 135–145)

## 2015-04-30 LAB — CBC
HEMATOCRIT: 40.6 % (ref 35.0–47.0)
HEMOGLOBIN: 13.4 g/dL (ref 12.0–16.0)
MCH: 31.7 pg (ref 26.0–34.0)
MCHC: 33 g/dL (ref 32.0–36.0)
MCV: 96.1 fL (ref 80.0–100.0)
Platelets: 226 10*3/uL (ref 150–440)
RBC: 4.23 MIL/uL (ref 3.80–5.20)
RDW: 14 % (ref 11.5–14.5)
WBC: 9.7 10*3/uL (ref 3.6–11.0)

## 2015-04-30 LAB — GLUCOSE, CAPILLARY: Glucose-Capillary: 198 mg/dL — ABNORMAL HIGH (ref 65–99)

## 2015-04-30 MED ORDER — GUAIFENESIN 100 MG/5ML PO SOLN
5.0000 mL | ORAL | Status: DC | PRN
  Administered 2015-04-30: 100 mg via ORAL
  Filled 2015-04-30: qty 10

## 2015-04-30 MED ORDER — GUAIFENESIN ER 600 MG PO TB12
600.0000 mg | ORAL_TABLET | Freq: Two times a day (BID) | ORAL | Status: DC
Start: 2015-04-30 — End: 2015-05-02
  Administered 2015-04-30 – 2015-05-02 (×5): 600 mg via ORAL
  Filled 2015-04-30 (×5): qty 1

## 2015-04-30 MED ORDER — ENOXAPARIN SODIUM 40 MG/0.4ML ~~LOC~~ SOLN
40.0000 mg | SUBCUTANEOUS | Status: DC
  Administered 2015-04-30 – 2015-05-01 (×2): 40 mg via SUBCUTANEOUS
  Filled 2015-04-30 (×2): qty 0.4

## 2015-04-30 MED ORDER — THEOPHYLLINE ER 100 MG PO TB12
100.0000 mg | ORAL_TABLET | Freq: Every day | ORAL | Status: DC
  Administered 2015-04-30 – 2015-05-01 (×2): 100 mg via ORAL
  Filled 2015-04-30 (×4): qty 1

## 2015-04-30 MED ORDER — OXYCODONE HCL 5 MG PO TABS
10.0000 mg | ORAL_TABLET | ORAL | Status: DC | PRN
  Administered 2015-04-30 – 2015-05-02 (×9): 10 mg via ORAL
  Filled 2015-04-30 (×9): qty 2

## 2015-04-30 MED ORDER — THEOPHYLLINE ER 200 MG PO CP24
200.0000 mg | ORAL_CAPSULE | Freq: Every day | ORAL | Status: DC
  Filled 2015-04-30: qty 1

## 2015-04-30 MED ORDER — THEOPHYLLINE ER 400 MG PO TB24
200.0000 mg | ORAL_TABLET | Freq: Every day | ORAL | Status: DC
  Filled 2015-04-30: qty 0.5

## 2015-04-30 NOTE — Progress Notes (Signed)
[place] at [facility]                                                                                                                                                                                            Patient Demographics   Dawn Donaldson, is a 65 y.o. female, DOB - 02/21/1950, [id number]  Admit date - 04/29/2015   Admitting Physician Epifanio Lesches, MD  Outpatient Primary MD for the patient is Lorelee Market, MD   LOS - 1  Subjective: Patient admitted with acute respiratory failure. Still very short of breath. Denies any chest pain complains of dry cough.    Review of Systems:   CONSTITUTIONAL: No documented fever. No fatigue, weakness. No weight gain, no weight loss.  EYES: No blurry or double vision.  ENT: No tinnitus. No postnasal drip. No redness of the oropharynx.  RESPIRATORY: Positive cough, no wheeze, no hemoptysis. Positive dyspnea.  CARDIOVASCULAR: No chest pain. No orthopnea. No palpitations. No syncope.  GASTROINTESTINAL: No nausea, no vomiting or diarrhea. No abdominal pain. No melena or hematochezia.  GENITOURINARY: No dysuria or hematuria.  ENDOCRINE: No polyuria or nocturia. No heat or cold intolerance.  HEMATOLOGY: No anemia. No bruising. No bleeding.  INTEGUMENTARY: No rashes. No lesions.  MUSCULOSKELETAL: No arthritis. No swelling. No gout.  NEUROLOGIC: No numbness, tingling, or ataxia. No seizure-type activity.  PSYCHIATRIC: No anxiety. No insomnia. No ADD.    Vitals:   Filed Vitals:   04/29/15 2230 04/30/15 0342 04/30/15 0729 04/30/15 1124  BP: 145/76 124/60  113/59  Pulse: 91 79  86  Temp: 98.6 F (37 C) 98.2 F (36.8 C)  97.7 F (36.5 C)  TempSrc: Oral Oral  Oral  Resp: 20 16  18   Height:      Weight: 94.121 kg (207 lb 8 oz) 93.668 kg (206 lb 8 oz)    SpO2: 98% 95% 95% 97%    Wt Readings from Last 3 Encounters:  04/30/15 93.668 kg (206 lb 8 oz)  03/27/15 95.227 kg (209 lb 15 oz)  03/09/15  90.719 kg (200 lb)     Intake/Output Summary (Last 24 hours) at 04/30/15 1221 Last data filed at 04/30/15 0830  Gross per 24 hour  Intake    480 ml  Output    650 ml  Net   -170 ml    Physical Exam:   GENERAL: Pleasant-appearing in no apparent distress.  HEAD, EYES, EARS, NOSE AND THROAT: Atraumatic, normocephalic. Extraocular muscles are intact. Pupils equal and reactive to light. Sclerae anicteric. No conjunctival injection. No oro-pharyngeal erythema.  NECK: Supple. There is no jugular  venous distention. No bruits, no lymphadenopathy, no thyromegaly.  HEART: Regular rate and rhythm,. No murmurs, no rubs, no clicks.  LUNGS: Has bilateral wheezing throughout both lungs with some associated muscle usage no rhonchi or crackles ABDOMEN: Soft, flat, nontender, nondistended. Has good bowel sounds. No hepatosplenomegaly appreciated.  EXTREMITIES: No evidence of any cyanosis, clubbing, or peripheral edema.  +2 pedal and radial pulses bilaterally.  NEUROLOGIC: The patient is alert, awake, and oriented x3 with no focal motor or sensory deficits appreciated bilaterally.  SKIN: Moist and warm with no rashes appreciated.  Psych: Not anxious, depressed LN: No inguinal LN enlargement    Antibiotics   Anti-infectives    Start     Dose/Rate Route Frequency Ordered Stop   04/29/15 1915  cefTRIAXone (ROCEPHIN) 1 g in dextrose 5 % 50 mL IVPB     1 g 100 mL/hr over 30 Minutes Intravenous Every 24 hours 04/29/15 1905     04/29/15 1915  azithromycin (ZITHROMAX) 500 mg in dextrose 5 % 250 mL IVPB     500 mg 250 mL/hr over 60 Minutes Intravenous Every 24 hours 04/29/15 1905     04/29/15 1845  azithromycin (ZITHROMAX) 500 mg in dextrose 5 % 250 mL IVPB     500 mg 250 mL/hr over 60 Minutes Intravenous  Once 04/29/15 1841 04/29/15 2049      Medications   Scheduled Meds: . azithromycin  500 mg Intravenous Q24H  . cefTRIAXone (ROCEPHIN)  IV  1 g Intravenous Q24H  . clopidogrel  75 mg Oral QPM  .  docusate sodium  100 mg Oral BID  . furosemide  40 mg Oral QODAY  . guaiFENesin  600 mg Oral BID  . hydrochlorothiazide  12.5 mg Oral QPM  . ipratropium-albuterol  3 mL Nebulization Q6H  . lisinopril  20 mg Oral QPM  . methylPREDNISolone (SOLU-MEDROL) injection  60 mg Intravenous Q6H  . mometasone-formoterol  2 puff Inhalation BID  . pantoprazole  40 mg Oral Daily  . simvastatin  20 mg Oral QPM  . theophylline  200 mg Oral QPM   Continuous Infusions:  PRN Meds:.acetaminophen **OR** acetaminophen, acetaminophen, bisacodyl, chlorpheniramine-HYDROcodone, guaiFENesin, ipratropium-albuterol, nitroGLYCERIN, ondansetron **OR** ondansetron (ZOFRAN) IV, oxyCODONE, traZODone   Data Review:   Micro Results No results found for this or any previous visit (from the past 240 hour(s)).  Radiology Reports Dg Chest Port 1 View  04/29/2015  CLINICAL DATA:  65 year old female with COPD. Increasing shortness of breath. EXAM: PORTABLE CHEST 1 VIEW COMPARISON:  03/24/2015. FINDINGS: No infiltrate, congestive heart failure or pneumothorax. No plain film evidence pulmonary malignancy. Heart size top-normal. IMPRESSION: No acute abnormality noted. Electronically Signed   By: Genia Del M.D.   On: 04/29/2015 17:54     CBC  Recent Labs Lab 04/29/15 1654 04/30/15 0409  WBC 12.7* 9.7  HGB 14.1 13.4  HCT 42.0 40.6  PLT 244 226  MCV 95.1 96.1  MCH 31.9 31.7  MCHC 33.5 33.0  RDW 13.9 14.0  LYMPHSABS 5.8*  --   MONOABS 0.8  --   EOSABS 0.3  --   BASOSABS 0.0  --     Chemistries   Recent Labs Lab 04/29/15 1654 04/30/15 0409  NA 140 138  K 3.9 3.6  CL 104 100*  CO2 28 25  GLUCOSE 110* 169*  BUN 15 14  CREATININE 0.76 0.76  CALCIUM 9.4 9.1   ------------------------------------------------------------------------------------------------------------------ estimated creatinine clearance is 78.3 mL/min (by C-G formula based on Cr of  0.76). ------------------------------------------------------------------------------------------------------------------  No results for input(s): HGBA1C in the last 72 hours. ------------------------------------------------------------------------------------------------------------------ No results for input(s): CHOL, HDL, LDLCALC, TRIG, CHOLHDL, LDLDIRECT in the last 72 hours. ------------------------------------------------------------------------------------------------------------------ No results for input(s): TSH, T4TOTAL, T3FREE, THYROIDAB in the last 72 hours.  Invalid input(s): FREET3 ------------------------------------------------------------------------------------------------------------------ No results for input(s): VITAMINB12, FOLATE, FERRITIN, TIBC, IRON, RETICCTPCT in the last 72 hours.  Coagulation profile No results for input(s): INR, PROTIME in the last 168 hours.  No results for input(s): DDIMER in the last 72 hours.  Cardiac Enzymes  Recent Labs Lab 04/29/15 1654  TROPONINI <0.03   ------------------------------------------------------------------------------------------------------------------ Invalid input(s): POCBNP    Assessment & Plan  Patient is a 65 year old white female with chronic respiratory failure presents with shortness of breath  1. Acute on chronic respiratory failure due to acute on chronic COPD exasperation Patient continues to be symptomatic Continue empiric antibiotics with ceftriaxone azithromycin Continue nebulizer therapy Continue IV Solu-Medrol Continue theophylline Continue to let her I will add Mucinex to her therapy  2. Chronic diastolic CHF without any acute exasperation Continue Lasix orally  3. Hypertension we'll continue therapy with lisinopril and HCTZ  4. Peripheral vascular disease with carotid artery stenosis continue Plavix as taking at home  5. GERD continue Protonix  6. Miscellaneous I'll add Lovenox for  DVT prophylaxis        Code Status Orders        Start     Ordered   04/29/15 1904  Full code   Continuous     04/29/15 1905    Code Status History    Date Active Date Inactive Code Status Order ID Comments User Context   03/24/2015  3:34 PM 03/27/2015  3:06 PM Full Code [id number]  Hillary Bow, MD ED   02/26/2015  4:20 PM 02/28/2015  3:30 PM Full Code [id number]  Gladstone Lighter, MD Inpatient   12/28/2014  8:45 PM 01/03/2015  3:36 PM Full Code [id number]  Nicholes Mango, MD Inpatient   09/18/2014  3:28 PM 09/22/2014 11:57 AM Full Code [id number]  Bettey Costa, MD Inpatient   05/23/2014  4:02 PM 05/27/2014  4:46 PM Full Code [id number]  Bettey Costa, MD Inpatient           Consults  none DVT Prophylaxis  Lovenox   Lab Results  Component Value Date   PLT 226 04/30/2015     Time Spent in minutes   35 minutes Greater than 50% of time spent in care coordination and counseling patient regarding the condition and plan of care.   Dustin Flock M.D on 04/30/2015 at 12:21 PM  Between 7am to 6pm - Pager - [phone]  After 6pm go to [email or web address] - password [email or web address] [facility]   Office  [phone]

## 2015-04-30 NOTE — Care Management Note (Signed)
Case Management Note  Patient Details  Name: Tona Skupien MRN: [id number] Date of Birth: 08/21/1950  Subjective/Objective:    Cm assessment for discharge planning. Patient readmit from last month with COPD exacerbation. She continues to live at home with her boyfriend danny and her 63 year old grandchild. She has a nebulizer and bedside commode. She states she was sent a hospital bed at last discharge but had to send it back because it was so uncomfortable. Chronic O2 though [facility]. She refuses any home health Sn or PT services. Denies issues accessing medical care, copays  or transporation.                 Action/Plan: No needs identified at this time.   Expected Discharge Date:                  Expected Discharge Plan:  Home/Self Care  In-House Referral:     Discharge planning Services  CM Consult (COPD GOLD)  Post Acute Care Choice:    Choice offered to:     DME Arranged:    DME Agency:     HH Arranged:  Patient Refused [place] Agency:     Status of Service:  Completed, signed off  Medicare Important Message Given:    Date Medicare IM Given:    Medicare IM give by:    Date Additional Medicare IM Given:    Additional Medicare Important Message give by:     If discussed at [place] of Stay Meetings, dates discussed:    Additional Comments:  Jolly Mango, RN 04/30/2015, 9:40 AM

## 2015-05-01 LAB — CBC
HEMATOCRIT: 39.1 % (ref 35.0–47.0)
HEMOGLOBIN: 12.7 g/dL (ref 12.0–16.0)
MCH: 31.6 pg (ref 26.0–34.0)
MCHC: 32.4 g/dL (ref 32.0–36.0)
MCV: 97.6 fL (ref 80.0–100.0)
PLATELETS: 222 10*3/uL (ref 150–440)
RBC: 4.01 MIL/uL (ref 3.80–5.20)
RDW: 14.1 % (ref 11.5–14.5)
WBC: 20.8 10*3/uL — ABNORMAL HIGH (ref 3.6–11.0)

## 2015-05-01 LAB — BASIC METABOLIC PANEL
ANION GAP: 8 (ref 5–15)
BUN: 22 mg/dL — AB (ref 6–20)
CHLORIDE: 102 mmol/L (ref 101–111)
CO2: 31 mmol/L (ref 22–32)
Calcium: 9 mg/dL (ref 8.9–10.3)
Creatinine, Ser: 0.89 mg/dL (ref 0.44–1.00)
GFR calc Af Amer: >60 mL/min (ref >60)
GFR calc non Af Amer: >60 mL/min (ref >60)
GLUCOSE: 148 mg/dL — AB (ref 65–99)
POTASSIUM: 4.1 mmol/L (ref 3.5–5.1)
Sodium: 141 mmol/L (ref 135–145)

## 2015-05-01 LAB — GLUCOSE, CAPILLARY
GLUCOSE-CAPILLARY: 143 mg/dL — AB (ref 65–99)
Glucose-Capillary: 138 mg/dL — ABNORMAL HIGH (ref 65–99)

## 2015-05-01 MED ORDER — GUAIFENESIN-CODEINE 100-10 MG/5ML PO SOLN: 10.0000 mL | ORAL | Status: DC | PRN

## 2015-05-01 NOTE — Progress Notes (Signed)
Per Dr. Posey Pronto okay to d/c cardiac monitoring. Will continue to monitor Horton Finer

## 2015-05-01 NOTE — Progress Notes (Signed)
[place] [facility]                                                                                                                                                                                            Patient Demographics   Dawn Donaldson, is a 65 y.o. female, DOB - 04/26/1950, [id number]  Admit date - 04/29/2015   Admitting Physician Epifanio Lesches, MD  Outpatient Primary MD for the patient is Lorelee Market, MD   LOS - 2  Subjective:Still continues to be short of breath. In having significant amount of coughing. Some improvement in breathing  Review of Systems:   CONSTITUTIONAL: No documented fever. No fatigue, weakness. No weight gain, no weight loss.  EYES: No blurry or double vision.  ENT: No tinnitus. No postnasal drip. No redness of the oropharynx.  RESPIRATORY: Positive cough, no wheeze, no hemoptysis. Positive dyspnea.  CARDIOVASCULAR: No chest pain. No orthopnea. No palpitations. No syncope.  GASTROINTESTINAL: No nausea, no vomiting or diarrhea. No abdominal pain. No melena or hematochezia.  GENITOURINARY: No dysuria or hematuria.  ENDOCRINE: No polyuria or nocturia. No heat or cold intolerance.  HEMATOLOGY: No anemia. No bruising. No bleeding.  INTEGUMENTARY: No rashes. No lesions.  MUSCULOSKELETAL: No arthritis. No swelling. No gout.  NEUROLOGIC: No numbness, tingling, or ataxia. No seizure-type activity.  PSYCHIATRIC: No anxiety. No insomnia. No ADD.    Vitals:   Filed Vitals:   04/30/15 1938 05/01/15 0233 05/01/15 0402 05/01/15 0735  BP: 142/74  127/61   Pulse: 97  81   Temp: 98.2 F (36.8 C)  98.5 F (36.9 C)   TempSrc: Oral  Oral   Resp: 18  18   Height:      Weight:   95.618 kg (210 lb 12.8 oz)   SpO2: 96% 92% 94% 93%    Wt Readings from Last 3 Encounters:  05/01/15 95.618 kg (210 lb 12.8 oz)  03/27/15 95.227 kg (209 lb 15 oz)  03/09/15 90.719 kg (200 lb)     Intake/Output Summary (Last 24  hours) at 05/01/15 0903 Last data filed at 05/01/15 0618  Gross per 24 hour  Intake    360 ml  Output    950 ml  Net   -590 ml    Physical Exam:   GENERAL: Pleasant-appearing in no apparent distress.  HEAD, EYES, EARS, NOSE AND THROAT: Atraumatic, normocephalic. Extraocular muscles are intact. Pupils equal and reactive to light. Sclerae anicteric. No conjunctival injection. No oro-pharyngeal erythema.  NECK: Supple. There is no jugular venous distention. No bruits, no lymphadenopathy, no thyromegaly.  HEART: Regular rate and  rhythm,. No murmurs, no rubs, no clicks.  LUNGS: Diminished breath sounds in both lungs with some associated muscle usage no rhonchi or crackles ABDOMEN: Soft, flat, nontender, nondistended. Has good bowel sounds. No hepatosplenomegaly appreciated.  EXTREMITIES: No evidence of any cyanosis, clubbing, or peripheral edema.  +2 pedal and radial pulses bilaterally.  NEUROLOGIC: The patient is alert, awake, and oriented x3 with no focal motor or sensory deficits appreciated bilaterally.  SKIN: Moist and warm with no rashes appreciated.  Psych: Not anxious, depressed LN: No inguinal LN enlargement    Antibiotics   Anti-infectives    Start     Dose/Rate Route Frequency Ordered Stop   04/29/15 1915  cefTRIAXone (ROCEPHIN) 1 g in dextrose 5 % 50 mL IVPB     1 g 100 mL/hr over 30 Minutes Intravenous Every 24 hours 04/29/15 1905     04/29/15 1915  azithromycin (ZITHROMAX) 500 mg in dextrose 5 % 250 mL IVPB     500 mg 250 mL/hr over 60 Minutes Intravenous Every 24 hours 04/29/15 1905     04/29/15 1845  azithromycin (ZITHROMAX) 500 mg in dextrose 5 % 250 mL IVPB     500 mg 250 mL/hr over 60 Minutes Intravenous  Once 04/29/15 1841 04/29/15 2049      Medications   Scheduled Meds: . azithromycin  500 mg Intravenous Q24H  . cefTRIAXone (ROCEPHIN)  IV  1 g Intravenous Q24H  . clopidogrel  75 mg Oral QPM  . docusate sodium  100 mg Oral BID  . enoxaparin (LOVENOX)  injection  40 mg Subcutaneous Q24H  . furosemide  40 mg Oral QODAY  . guaiFENesin  600 mg Oral BID  . hydrochlorothiazide  12.5 mg Oral QPM  . ipratropium-albuterol  3 mL Nebulization Q6H  . lisinopril  20 mg Oral QPM  . methylPREDNISolone (SOLU-MEDROL) injection  60 mg Intravenous Q6H  . mometasone-formoterol  2 puff Inhalation BID  . pantoprazole  40 mg Oral Daily  . simvastatin  20 mg Oral QPM  . theophylline  100 mg Oral Daily   Continuous Infusions:  PRN Meds:.acetaminophen **OR** acetaminophen, acetaminophen, bisacodyl, chlorpheniramine-HYDROcodone, guaiFENesin, guaiFENesin-codeine, ipratropium-albuterol, nitroGLYCERIN, ondansetron **OR** ondansetron (ZOFRAN) IV, oxyCODONE, traZODone   Data Review:   Micro Results No results found for this or any previous visit (from the past 240 hour(s)).  Radiology Reports Dg Chest Port 1 View  04/29/2015  CLINICAL DATA:  65 year old female with COPD. Increasing shortness of breath. EXAM: PORTABLE CHEST 1 VIEW COMPARISON:  03/24/2015. FINDINGS: No infiltrate, congestive heart failure or pneumothorax. No plain film evidence pulmonary malignancy. Heart size top-normal. IMPRESSION: No acute abnormality noted. Electronically Signed   By: Genia Del M.D.   On: 04/29/2015 17:54     CBC  Recent Labs Lab 04/29/15 1654 04/30/15 0409 05/01/15 0412  WBC 12.7* 9.7 20.8*  HGB 14.1 13.4 12.7  HCT 42.0 40.6 39.1  PLT 244 226 222  MCV 95.1 96.1 97.6  MCH 31.9 31.7 31.6  MCHC 33.5 33.0 32.4  RDW 13.9 14.0 14.1  LYMPHSABS 5.8*  --   --   MONOABS 0.8  --   --   EOSABS 0.3  --   --   BASOSABS 0.0  --   --     Chemistries   Recent Labs Lab 04/29/15 1654 04/30/15 0409 05/01/15 0412  NA 140 138 141  K 3.9 3.6 4.1  CL 104 100* 102  CO2 28 25 31   GLUCOSE 110* 169* 148*  BUN 15 14  22*  CREATININE 0.76 0.76 0.89  CALCIUM 9.4 9.1 9.0    ------------------------------------------------------------------------------------------------------------------ estimated creatinine clearance is 71.2 mL/min (by C-G formula based on Cr of 0.89). ------------------------------------------------------------------------------------------------------------------ No results for input(s): HGBA1C in the last 72 hours. ------------------------------------------------------------------------------------------------------------------ No results for input(s): CHOL, HDL, LDLCALC, TRIG, CHOLHDL, LDLDIRECT in the last 72 hours. ------------------------------------------------------------------------------------------------------------------ No results for input(s): TSH, T4TOTAL, T3FREE, THYROIDAB in the last 72 hours.  Invalid input(s): FREET3 ------------------------------------------------------------------------------------------------------------------ No results for input(s): VITAMINB12, FOLATE, FERRITIN, TIBC, IRON, RETICCTPCT in the last 72 hours.  Coagulation profile No results for input(s): INR, PROTIME in the last 168 hours.  No results for input(s): DDIMER in the last 72 hours.  Cardiac Enzymes  Recent Labs Lab 04/29/15 1654  TROPONINI <0.03   ------------------------------------------------------------------------------------------------------------------ Invalid input(s): POCBNP    Assessment & Plan  Patient is a 65 year old white female with chronic respiratory failure presents with shortness of breath  1. Acute on chronic respiratory failure due to acute on chronic COPD exasperation Symptoms persist Continue empiric antibiotics with ceftriaxone azithromycin Continue nebulizer with duo nebs every 6 hours with every 4 hours when necessary Continue IV Solu-Medrol Continue theophylline dose has to be changed due to nonavailability of her home dosage Add codeine with guaifenesin cough syrup Continue mucolytics  2.  Chronic diastolic CHF currently compensated  Continue Lasix orally  3. Hypertension well controlled we'll continue therapy with lisinopril and HCTZ  4. Peripheral vascular disease with carotid artery stenosis continue Plavix   5. GERD continue Protonix  6. Miscellaneous I'll add Lovenox for DVT prophylaxis        Code Status Orders        Start     Ordered   04/29/15 1904  Full code   Continuous     04/29/15 1905    Code Status History    Date Active Date Inactive Code Status Order ID Comments User Context   03/24/2015  3:34 PM 03/27/2015  3:06 PM Full Code [id number]  Hillary Bow, MD ED   02/26/2015  4:20 PM 02/28/2015  3:30 PM Full Code [id number]  Gladstone Lighter, MD Inpatient   12/28/2014  8:45 PM 01/03/2015  3:36 PM Full Code [id number]  Nicholes Mango, MD Inpatient   09/18/2014  3:28 PM 09/22/2014 11:57 AM Full Code [id number]  Bettey Costa, MD Inpatient   05/23/2014  4:02 PM 05/27/2014  4:46 PM Full Code [id number]  Bettey Costa, MD Inpatient           Consults  none DVT Prophylaxis  Lovenox   Lab Results  Component Value Date   PLT 222 05/01/2015     Time Spent in minutes   32 minutes Greater than 50% of time spent in care coordination and counseling patient regarding the condition and plan of care.   Dustin Flock M.D on 05/01/2015 at 9:03 AM  Between 7am to 6pm - Pager - [phone]  After 6pm go to [email or web address] - password [email or web address] [facility]   Office  [phone]

## 2015-05-01 NOTE — Progress Notes (Signed)
Pt is in no distress. Pt has Bipap in room but not in use. Pt doesn't wish to wear Bipap unless in distress.

## 2015-05-01 NOTE — Progress Notes (Signed)
Pt is in no distress. Bipap is in the room and not in use. Pt. Doesn't feel like she needs to wear the Bipap. Pt encouraged to call should she need to wear the Bipap

## 2015-05-01 NOTE — Care Management Important Message (Signed)
Important Message  Patient Details  Name: Dawn Donaldson MRN: [id number] Date of Birth: [birth year]   Medicare Important Message Given:  Yes    Juliann Pulse A [name] 05/01/2015, 1:25 PM

## 2015-05-02 LAB — GLUCOSE, CAPILLARY: GLUCOSE-CAPILLARY: 116 mg/dL — AB (ref 65–99)

## 2015-05-02 MED ORDER — LEVOFLOXACIN 500 MG PO TABS: 500.0000 mg | ORAL_TABLET | Freq: Every day | ORAL | Status: AC

## 2015-05-02 MED ORDER — GUAIFENESIN ER 600 MG PO TB12: 600.0000 mg | ORAL_TABLET | Freq: Two times a day (BID) | ORAL | Status: AC

## 2015-05-02 MED ORDER — GUAIFENESIN-CODEINE 100-10 MG/5ML PO SOLN: 10.0000 mL | Freq: Four times a day (QID) | ORAL | Status: AC | PRN

## 2015-05-02 MED ORDER — PREDNISONE 10 MG (21) PO TBPK: 10.0000 mg | ORAL_TABLET | Freq: Every day | ORAL | Status: DC

## 2015-05-02 NOTE — Discharge Summary (Signed)
[address] [place], [place] y.o., DOB 06/08/1950, MRN [id number]. Admission date: 04/29/2015 Discharge Date 05/02/2015 Primary MD Lorelee Market, MD Admitting Physician Epifanio Lesches, MD  Admission Diagnosis  Shortness of breath [R06.02] COPD exacerbation [facility]) [J44.1]  Discharge Diagnosis   Active Problems:   Acute on chronic respiratory failure ([place])  acute on chronic COPD exasperation  Chronic diastolic CHF Hypertension Peripheral vascular disease GERD       Hospital Course  patient is a 65 year old white female presented to the hospital with complaint of shortness of breath. She is chronically on oxygen at home. Patient had a chest x-ray which was negative. She was admitted for acute on chronic COPD exasperation she was treated with nebulizers steroids and antibiotics with significant improvement in her symptoms. She is currently feeling much better. And is stable for discharge.            Consults  None  Significant Tests:  See full reports for all details      Dg Chest Port 1 View  04/29/2015  CLINICAL DATA:  65 year old female with COPD. Increasing shortness of breath. EXAM: PORTABLE CHEST 1 VIEW COMPARISON:  03/24/2015. FINDINGS: No infiltrate, congestive heart failure or pneumothorax. No plain film evidence pulmonary malignancy. Heart size top-normal. IMPRESSION: No acute abnormality noted. Electronically Signed   By: Genia Del M.D.   On: 04/29/2015 17:54       Today   Subjective:   Dawn Donaldson  feels much better breathing improved  Objective:   Blood pressure 133/75, pulse 71, temperature 98.4 F (36.9 C), temperature source Oral, resp. rate 16, height 5\' 3"  (1.6 m), weight 94.53 kg (208 lb 6.4 oz), SpO2 96 %.  .  Intake/Output Summary (Last 24 hours) at 05/02/15 1558 Last data filed at 05/02/15 0830  Gross per 24 hour  Intake    480 ml  Output      0 ml  Net    480 ml    Exam VITAL SIGNS: Blood pressure 133/75, pulse 71, temperature 98.4 F  (36.9 C), temperature source Oral, resp. rate 16, height 5\' 3"  (1.6 m), weight 94.53 kg (208 lb 6.4 oz), SpO2 96 %.  GENERAL:  65 y.o.-year-old patient lying in the bed with no acute distress.  EYES: Pupils equal, round, reactive to light and accommodation. No scleral icterus. Extraocular muscles intact.  HEENT: Head atraumatic, normocephalic. Oropharynx and nasopharynx clear.  NECK:  Supple, no jugular venous distention. No thyroid enlargement, no tenderness.  LUNGS: Normal breath sounds bilaterally, no wheezing, rales,rhonchi or crepitation. No use of accessory muscles of respiration.  CARDIOVASCULAR: S1, S2 normal. No murmurs, rubs, or gallops.  ABDOMEN: Soft, nontender, nondistended. Bowel sounds present. No organomegaly or mass.  EXTREMITIES: No pedal edema, cyanosis, or clubbing.  NEUROLOGIC: Cranial nerves II through XII are intact. Muscle strength 5/5 in all extremities. Sensation intact. Gait not checked.  PSYCHIATRIC: The patient is alert and oriented x 3.  SKIN: No obvious rash, lesion, or ulcer.   Data Review     CBC w Diff: Lab Results  Component Value Date   WBC 20.8* 05/01/2015   WBC 17.5* 02/04/2014   HGB 12.7 05/01/2015   HGB 12.5 02/04/2014   HCT 39.1 05/01/2015   HCT 38.6 02/04/2014   PLT 222 05/01/2015   PLT 233 02/04/2014   LYMPHOPCT 46 04/29/2015   LYMPHOPCT 17.0 02/04/2014   BANDSPCT 0 04/29/2015   MONOPCT 6 04/29/2015   MONOPCT 1.8 02/04/2014   EOSPCT 2 04/29/2015   EOSPCT 0.0  02/04/2014   BASOPCT 0 04/29/2015   BASOPCT 0.2 02/04/2014   CMP: Lab Results  Component Value Date   NA 141 05/01/2015   NA 140 02/03/2014   K 4.1 05/01/2015   K 4.0 02/03/2014   CL 102 05/01/2015   CL 105 02/03/2014   CO2 31 05/01/2015   CO2 31 02/03/2014   BUN 22* 05/01/2015   BUN 14 02/03/2014   CREATININE 0.89 05/01/2015   CREATININE 0.74 02/03/2014   PROT 7.0 03/24/2015   PROT 7.6 01/27/2014   ALBUMIN 3.8 03/24/2015   ALBUMIN 3.5 01/27/2014   BILITOT 0.6  03/24/2015   BILITOT 0.2 01/27/2014   ALKPHOS 92 03/24/2015   ALKPHOS 110 01/27/2014   AST 20 03/24/2015   AST 26 01/27/2014   ALT 14 03/24/2015   ALT 19 01/27/2014  .  Micro Results No results found for this or any previous visit (from the past 240 hour(s)).   Code Status History    Date Active Date Inactive Code Status Order ID Comments User Context   04/29/2015  7:05 PM 05/02/2015  1:33 PM Full Code [id number]  Epifanio Lesches, MD ED   03/24/2015  3:34 PM 03/27/2015  3:06 PM Full Code [id number]  Hillary Bow, MD ED   02/26/2015  4:20 PM 02/28/2015  3:30 PM Full Code [id number]  Gladstone Lighter, MD Inpatient   12/28/2014  8:45 PM 01/03/2015  3:36 PM Full Code [id number]  Nicholes Mango, MD Inpatient   09/18/2014  3:28 PM 09/22/2014 11:57 AM Full Code [id number]  Bettey Costa, MD Inpatient   05/23/2014  4:02 PM 05/27/2014  4:46 PM Full Code [id number]  Bettey Costa, MD Inpatient          Follow-up Information    Follow up with Lorelee Market, MD. Go on 05/08/2015.   Specialty:  Family Medicine   Why:  at 1:30pm.    Contact information:   [facility] [place] [address] [phone]       Discharge Medications     Medication List    STOP taking these medications        azithromycin 500 MG tablet  Commonly known as:  ZITHROMAX      TAKE these medications        acetaminophen 500 MG tablet  Commonly known as:  TYLENOL  Take 1,000 mg by mouth every 6 (six) hours as needed for mild pain or headache.     albuterol 108 (90 Base) MCG/ACT inhaler  Commonly known as:  PROVENTIL HFA;VENTOLIN HFA  Inhale 2 puffs into the lungs every 6 (six) hours as needed for wheezing or shortness of breath.     clopidogrel 75 MG tablet  Commonly known as:  PLAVIX  Take 75 mg by mouth every evening.     ipratropium-albuterol 0.5-2.5 (3) MG/3ML Soln  Commonly known as:  DUONEB  Take 3 mLs by nebulization every 4 (four) hours as needed (for wheezing and/or shortness of breath).      COMBIVENT RESPIMAT 20-100 MCG/ACT Aers respimat  Generic drug:  Ipratropium-Albuterol  Inhale 1 puff into the lungs 4 (four) times daily.     esomeprazole 40 MG capsule  Commonly known as:  NEXIUM  Take 40 mg by mouth 2 (two) times daily.     Fluticasone-Salmeterol 250-50 MCG/DOSE Aepb  Commonly known as:  ADVAIR  Inhale 1 puff into the lungs 2 (two) times daily.     furosemide 40 MG tablet  Commonly known as:  LASIX  Take 40  mg by mouth every other day.     guaiFENesin 600 MG 12 hr tablet  Commonly known as:  MUCINEX  Take 1 tablet (600 mg total) by mouth 2 (two) times daily.     guaiFENesin-codeine 100-10 MG/5ML syrup  Take 10 mLs by mouth every 6 (six) hours as needed for cough.     hydrochlorothiazide 12.5 MG tablet  Commonly known as:  HYDRODIURIL  Take 12.5 mg by mouth every evening.     levofloxacin 500 MG tablet  Commonly known as:  LEVAQUIN  Take 1 tablet (500 mg total) by mouth daily.     lisinopril 20 MG tablet  Commonly known as:  PRINIVIL,ZESTRIL  Take 20 mg by mouth every evening.     nitroGLYCERIN 0.4 MG SL tablet  Commonly known as:  NITROSTAT  Place 0.4 mg under the tongue every 5 (five) minutes as needed for chest pain.     Oxycodone HCl 10 MG Tabs  Take 1 tablet (10 mg total) by mouth every 4 (four) hours as needed (for pain.).     predniSONE 10 MG (21) Tbpk tablet  Commonly known as:  STERAPRED UNI-PAK 21 TAB  Take 1 tablet (10 mg total) by mouth daily. 60 mg daily, taper 10 mg daily until done     simvastatin 20 MG tablet  Commonly known as:  ZOCOR  Take 20 mg by mouth every evening.     theophylline 400 MG 24 hr tablet  Commonly known as:  UNIPHYL  Take 100 mg by mouth daily.           Total Time in preparing paper work, data evaluation and todays exam - 35 minutes  Dustin Flock M.D on 05/02/2015 at 3:58 PM  [facility]   Office  [phone]

## 2015-05-02 NOTE — Progress Notes (Signed)
Patient received discharge instructions, pt verbalized understanding. IV was removed with no signs of infection. Dressing clean, dry intact. No skin tears or wounds present. Prescription was printed and given to patient. Patient was escorted out with staff member via wheelchair via private auto. No further needs from care management team.  

## 2015-05-02 NOTE — Discharge Instructions (Signed)
°  DIET:  °Cardiac diet ° °DISCHARGE CONDITION:  °Stable ° °ACTIVITY:  °Activity as tolerated ° °OXYGEN:  °Home Oxygen: Yes.   °  °Oxygen Delivery: 3 liters/min via Patient connected to nasal cannula oxygen ° °DISCHARGE LOCATION:  °home  ° ° °ADDITIONAL DISCHARGE INSTRUCTION: ° ° °If you experience worsening of your admission symptoms, develop shortness of breath, life threatening emergency, suicidal or homicidal thoughts you must seek medical attention immediately by calling 911 or calling your MD immediately  if symptoms less severe. ° °You Must read complete instructions/literature along with all the possible adverse reactions/side effects for all the Medicines you take and that have been prescribed to you. Take any new Medicines after you have completely understood and accpet all the possible adverse reactions/side effects.  ° °Please note ° °You were cared for by a hospitalist during your hospital stay. If you have any questions about your discharge medications or the care you received while you were in the hospital after you are discharged, you can call the unit and asked to speak with the hospitalist on call if the hospitalist that took care of you is not available. Once you are discharged, your primary care physician will handle any further medical issues. Please note that NO REFILLS for any discharge medications will be authorized once you are discharged, as it is imperative that you return to your primary care physician (or establish a relationship with a primary care physician if you do not have one) for your aftercare needs so that they can reassess your need for medications and monitor your lab values. ° ° °

## 2015-06-01 ENCOUNTER — Encounter: Payer: Self-pay | Admitting: Emergency Medicine

## 2015-06-01 ENCOUNTER — Inpatient Hospital Stay
Admission: EM | Admit: 2015-06-01 | Discharge: 2015-06-08 | DRG: 189 | Disposition: A | Discharge status: Home / Self Care | Payer: Medicare Other | Attending: Internal Medicine | Admitting: Internal Medicine

## 2015-06-01 ENCOUNTER — Emergency Department: Payer: Medicare Other

## 2015-06-01 DIAGNOSIS — J9622 Acute and chronic respiratory failure with hypercapnia: Secondary | ICD-10-CM | POA: Diagnosis present

## 2015-06-01 DIAGNOSIS — M810 Age-related osteoporosis without current pathological fracture: Secondary | ICD-10-CM | POA: Diagnosis present

## 2015-06-01 DIAGNOSIS — T380X5A Adverse effect of glucocorticoids and synthetic analogues, initial encounter: Secondary | ICD-10-CM | POA: Diagnosis present

## 2015-06-01 DIAGNOSIS — Z8673 Personal history of transient ischemic attack (TIA), and cerebral infarction without residual deficits: Secondary | ICD-10-CM

## 2015-06-01 DIAGNOSIS — R110 Nausea: Secondary | ICD-10-CM | POA: Diagnosis present

## 2015-06-01 DIAGNOSIS — R079 Chest pain, unspecified: Secondary | ICD-10-CM

## 2015-06-01 DIAGNOSIS — Z9889 Other specified postprocedural states: Secondary | ICD-10-CM | POA: Diagnosis not present

## 2015-06-01 DIAGNOSIS — Z9861 Coronary angioplasty status: Secondary | ICD-10-CM | POA: Diagnosis not present

## 2015-06-01 DIAGNOSIS — Z9049 Acquired absence of other specified parts of digestive tract: Secondary | ICD-10-CM

## 2015-06-01 DIAGNOSIS — Z90711 Acquired absence of uterus with remaining cervical stump: Secondary | ICD-10-CM | POA: Diagnosis not present

## 2015-06-01 DIAGNOSIS — J9620 Acute and chronic respiratory failure, unspecified whether with hypoxia or hypercapnia: Secondary | ICD-10-CM | POA: Diagnosis present

## 2015-06-01 DIAGNOSIS — Z9981 Dependence on supplemental oxygen: Secondary | ICD-10-CM

## 2015-06-01 DIAGNOSIS — Z888 Allergy status to other drugs, medicaments and biological substances status: Secondary | ICD-10-CM

## 2015-06-01 DIAGNOSIS — J441 Chronic obstructive pulmonary disease with (acute) exacerbation: Secondary | ICD-10-CM | POA: Diagnosis present

## 2015-06-01 DIAGNOSIS — Z8249 Family history of ischemic heart disease and other diseases of the circulatory system: Secondary | ICD-10-CM

## 2015-06-01 DIAGNOSIS — Z79899 Other long term (current) drug therapy: Secondary | ICD-10-CM

## 2015-06-01 DIAGNOSIS — K219 Gastro-esophageal reflux disease without esophagitis: Secondary | ICD-10-CM | POA: Diagnosis present

## 2015-06-01 DIAGNOSIS — Z86010 Personal history of colonic polyps: Secondary | ICD-10-CM

## 2015-06-01 DIAGNOSIS — Z90710 Acquired absence of both cervix and uterus: Secondary | ICD-10-CM | POA: Diagnosis not present

## 2015-06-01 DIAGNOSIS — I110 Hypertensive heart disease with heart failure: Secondary | ICD-10-CM | POA: Diagnosis present

## 2015-06-01 DIAGNOSIS — Z7951 Long term (current) use of inhaled steroids: Secondary | ICD-10-CM | POA: Diagnosis not present

## 2015-06-01 DIAGNOSIS — M1990 Unspecified osteoarthritis, unspecified site: Secondary | ICD-10-CM | POA: Diagnosis present

## 2015-06-01 DIAGNOSIS — J9621 Acute and chronic respiratory failure with hypoxia: Secondary | ICD-10-CM | POA: Diagnosis present

## 2015-06-01 DIAGNOSIS — G4730 Sleep apnea, unspecified: Secondary | ICD-10-CM | POA: Diagnosis present

## 2015-06-01 DIAGNOSIS — M545 Low back pain: Secondary | ICD-10-CM | POA: Diagnosis present

## 2015-06-01 DIAGNOSIS — G8929 Other chronic pain: Secondary | ICD-10-CM | POA: Diagnosis present

## 2015-06-01 DIAGNOSIS — I5032 Chronic diastolic (congestive) heart failure: Secondary | ICD-10-CM | POA: Diagnosis present

## 2015-06-01 DIAGNOSIS — R0600 Dyspnea, unspecified: Secondary | ICD-10-CM

## 2015-06-01 DIAGNOSIS — D72829 Elevated white blood cell count, unspecified: Secondary | ICD-10-CM | POA: Diagnosis present

## 2015-06-01 DIAGNOSIS — R0602 Shortness of breath: Secondary | ICD-10-CM

## 2015-06-01 DIAGNOSIS — I2510 Atherosclerotic heart disease of native coronary artery without angina pectoris: Secondary | ICD-10-CM | POA: Diagnosis present

## 2015-06-01 DIAGNOSIS — Z87891 Personal history of nicotine dependence: Secondary | ICD-10-CM | POA: Diagnosis not present

## 2015-06-01 LAB — COMPREHENSIVE METABOLIC PANEL
ALBUMIN: 4.2 g/dL (ref 3.5–5.0)
ALT: 14 U/L (ref 14–54)
AST: 18 U/L (ref 15–41)
Alkaline Phosphatase: 94 U/L (ref 38–126)
Anion gap: 6 (ref 5–15)
BUN: 18 mg/dL (ref 6–20)
CHLORIDE: 105 mmol/L (ref 101–111)
CO2: 30 mmol/L (ref 22–32)
CREATININE: 0.78 mg/dL (ref 0.44–1.00)
Calcium: 9.4 mg/dL (ref 8.9–10.3)
GFR calc Af Amer: >60 mL/min (ref >60)
GFR calc non Af Amer: >60 mL/min (ref >60)
Glucose, Bld: 108 mg/dL — ABNORMAL HIGH (ref 65–99)
Potassium: 4 mmol/L (ref 3.5–5.1)
SODIUM: 141 mmol/L (ref 135–145)
Total Bilirubin: 0.4 mg/dL (ref 0.3–1.2)
Total Protein: 7.4 g/dL (ref 6.5–8.1)

## 2015-06-01 LAB — CBC
HCT: 43.3 % (ref 35.0–47.0)
Hemoglobin: 14.5 g/dL (ref 12.0–16.0)
MCH: 31.5 pg (ref 26.0–34.0)
MCHC: 33.4 g/dL (ref 32.0–36.0)
MCV: 94.3 fL (ref 80.0–100.0)
PLATELETS: 254 10*3/uL (ref 150–440)
RBC: 4.59 MIL/uL (ref 3.80–5.20)
RDW: 14.4 % (ref 11.5–14.5)
WBC: 18.6 10*3/uL — AB (ref 3.6–11.0)

## 2015-06-01 LAB — TROPONIN I: Troponin I: <0.03 ng/mL (ref <0.031)

## 2015-06-01 LAB — LACTIC ACID, PLASMA
LACTIC ACID, VENOUS: 0.8 mmol/L (ref 0.5–2.0)
Lactic Acid, Venous: 1.6 mmol/L (ref 0.5–2.0)

## 2015-06-01 MED ORDER — ENOXAPARIN SODIUM 40 MG/0.4ML ~~LOC~~ SOLN
40.0000 mg | SUBCUTANEOUS | Status: DC
Start: 2015-06-01 — End: 2015-06-08
  Administered 2015-06-01 – 2015-06-07 (×7): 40 mg via SUBCUTANEOUS
  Filled 2015-06-01 (×6): qty 0.4

## 2015-06-01 MED ORDER — ACETAMINOPHEN 325 MG PO TABS: 650.0000 mg | ORAL_TABLET | Freq: Four times a day (QID) | ORAL | Status: DC | PRN

## 2015-06-01 MED ORDER — METHYLPREDNISOLONE SODIUM SUCC 125 MG IJ SOLR
60.0000 mg | Freq: Two times a day (BID) | INTRAMUSCULAR | Status: DC
Start: 2015-06-01 — End: 2015-06-02
  Administered 2015-06-02: 60 mg via INTRAVENOUS
  Filled 2015-06-01: qty 2

## 2015-06-01 MED ORDER — FUROSEMIDE 40 MG PO TABS: 40.0000 mg | ORAL_TABLET | Freq: Every day | ORAL | Status: DC | PRN

## 2015-06-01 MED ORDER — IPRATROPIUM-ALBUTEROL 0.5-2.5 (3) MG/3ML IN SOLN
3.0000 mL | Freq: Once | RESPIRATORY_TRACT | Status: AC
  Administered 2015-06-01: 3 mL via RESPIRATORY_TRACT

## 2015-06-01 MED ORDER — IPRATROPIUM-ALBUTEROL 0.5-2.5 (3) MG/3ML IN SOLN
3.0000 mL | RESPIRATORY_TRACT | Status: DC | PRN
  Administered 2015-06-02: 3 mL via RESPIRATORY_TRACT
  Filled 2015-06-01: qty 3

## 2015-06-01 MED ORDER — IPRATROPIUM-ALBUTEROL 0.5-2.5 (3) MG/3ML IN SOLN
RESPIRATORY_TRACT | Status: AC
  Administered 2015-06-01: 3 mL via RESPIRATORY_TRACT
  Filled 2015-06-01: qty 6

## 2015-06-01 MED ORDER — ACETAMINOPHEN 650 MG RE SUPP: 650.0000 mg | Freq: Four times a day (QID) | RECTAL | Status: DC | PRN

## 2015-06-01 MED ORDER — ALBUTEROL SULFATE (2.5 MG/3ML) 0.083% IN NEBU
2.5000 mg | INHALATION_SOLUTION | Freq: Four times a day (QID) | RESPIRATORY_TRACT | Status: DC | PRN
  Administered 2015-06-02 – 2015-06-03 (×2): 2.5 mg via RESPIRATORY_TRACT
  Filled 2015-06-01 (×3): qty 3

## 2015-06-01 MED ORDER — ONDANSETRON HCL 4 MG/2ML IJ SOLN
4.0000 mg | Freq: Once | INTRAMUSCULAR | Status: AC
  Administered 2015-06-01: 4 mg via INTRAVENOUS

## 2015-06-01 MED ORDER — METHYLPREDNISOLONE SODIUM SUCC 125 MG IJ SOLR
INTRAMUSCULAR | Status: AC
  Administered 2015-06-01: 125 mg via INTRAVENOUS
  Filled 2015-06-01: qty 2

## 2015-06-01 MED ORDER — ASPIRIN 81 MG PO CHEW
324.0000 mg | CHEWABLE_TABLET | Freq: Once | ORAL | Status: AC
  Administered 2015-06-01: 324 mg via ORAL
  Filled 2015-06-01: qty 4

## 2015-06-01 MED ORDER — PANTOPRAZOLE SODIUM 40 MG PO TBEC
40.0000 mg | DELAYED_RELEASE_TABLET | Freq: Every day | ORAL | Status: DC
  Administered 2015-06-01 – 2015-06-08 (×8): 40 mg via ORAL
  Filled 2015-06-01 (×8): qty 1

## 2015-06-01 MED ORDER — SIMVASTATIN 20 MG PO TABS
20.0000 mg | ORAL_TABLET | Freq: Every evening | ORAL | Status: DC
  Administered 2015-06-01 – 2015-06-07 (×7): 20 mg via ORAL
  Filled 2015-06-01 (×7): qty 1

## 2015-06-01 MED ORDER — ONDANSETRON HCL 4 MG/2ML IJ SOLN
4.0000 mg | Freq: Four times a day (QID) | INTRAMUSCULAR | Status: DC | PRN
  Administered 2015-06-02 – 2015-06-08 (×10): 4 mg via INTRAVENOUS
  Filled 2015-06-01 (×11): qty 2

## 2015-06-01 MED ORDER — CLOPIDOGREL BISULFATE 75 MG PO TABS
75.0000 mg | ORAL_TABLET | Freq: Every evening | ORAL | Status: DC
  Administered 2015-06-01 – 2015-06-07 (×7): 75 mg via ORAL
  Filled 2015-06-01 (×7): qty 1

## 2015-06-01 MED ORDER — ALBUTEROL SULFATE HFA 108 (90 BASE) MCG/ACT IN AERS: 2.0000 | INHALATION_SPRAY | Freq: Four times a day (QID) | RESPIRATORY_TRACT | Status: DC | PRN

## 2015-06-01 MED ORDER — MAGNESIUM SULFATE 2 GM/50ML IV SOLN
2.0000 g | Freq: Once | INTRAVENOUS | Status: AC
  Administered 2015-06-01: 2 g via INTRAVENOUS
  Filled 2015-06-01: qty 50

## 2015-06-01 MED ORDER — ROFLUMILAST 500 MCG PO TABS
500.0000 ug | ORAL_TABLET | Freq: Every day | ORAL | Status: DC
  Administered 2015-06-02 – 2015-06-08 (×7): 500 ug via ORAL
  Filled 2015-06-01 (×7): qty 1

## 2015-06-01 MED ORDER — METHYLPREDNISOLONE SODIUM SUCC 125 MG IJ SOLR
125.0000 mg | Freq: Once | INTRAMUSCULAR | Status: AC
  Administered 2015-06-01: 125 mg via INTRAVENOUS

## 2015-06-01 MED ORDER — MOMETASONE FURO-FORMOTEROL FUM 200-5 MCG/ACT IN AERO
2.0000 | INHALATION_SPRAY | Freq: Two times a day (BID) | RESPIRATORY_TRACT | Status: DC
  Administered 2015-06-01 – 2015-06-08 (×14): 2 via RESPIRATORY_TRACT
  Filled 2015-06-01 (×2): qty 8.8

## 2015-06-01 MED ORDER — IPRATROPIUM-ALBUTEROL 20-100 MCG/ACT IN AERS: 1.0000 | INHALATION_SPRAY | Freq: Four times a day (QID) | RESPIRATORY_TRACT | Status: DC

## 2015-06-01 MED ORDER — HYDROCHLOROTHIAZIDE 25 MG PO TABS
12.5000 mg | ORAL_TABLET | Freq: Every evening | ORAL | Status: DC
  Administered 2015-06-01 – 2015-06-07 (×7): 12.5 mg via ORAL
  Filled 2015-06-01 (×7): qty 1

## 2015-06-01 MED ORDER — THEOPHYLLINE ER 100 MG PO CP24
100.0000 mg | ORAL_CAPSULE | Freq: Every day | ORAL | Status: DC
  Administered 2015-06-02 – 2015-06-03 (×2): 100 mg via ORAL
  Filled 2015-06-01 (×3): qty 1

## 2015-06-01 MED ORDER — IPRATROPIUM-ALBUTEROL 0.5-2.5 (3) MG/3ML IN SOLN
3.0000 mL | Freq: Four times a day (QID) | RESPIRATORY_TRACT | Status: DC
  Administered 2015-06-01 – 2015-06-02 (×2): 3 mL via RESPIRATORY_TRACT
  Filled 2015-06-01 (×2): qty 3

## 2015-06-01 MED ORDER — OXYCODONE HCL 5 MG PO TABS
5.0000 mg | ORAL_TABLET | ORAL | Status: DC | PRN
  Administered 2015-06-04: 5 mg via ORAL

## 2015-06-01 MED ORDER — MORPHINE SULFATE (PF) 4 MG/ML IV SOLN
4.0000 mg | Freq: Once | INTRAVENOUS | Status: AC
  Administered 2015-06-01: 4 mg via INTRAVENOUS

## 2015-06-01 MED ORDER — THEOPHYLLINE ER 400 MG PO TB24: 100.0000 mg | ORAL_TABLET | Freq: Every day | ORAL | Status: DC

## 2015-06-01 MED ORDER — ACETAMINOPHEN 500 MG PO TABS
1000.0000 mg | ORAL_TABLET | Freq: Four times a day (QID) | ORAL | Status: DC | PRN
  Administered 2015-06-01: 1000 mg via ORAL
  Filled 2015-06-01: qty 2

## 2015-06-01 MED ORDER — ALBUTEROL SULFATE (2.5 MG/3ML) 0.083% IN NEBU
2.5000 mg | INHALATION_SOLUTION | RESPIRATORY_TRACT | Status: DC | PRN
  Administered 2015-06-01 – 2015-06-02 (×3): 2.5 mg via RESPIRATORY_TRACT
  Filled 2015-06-01 (×2): qty 3

## 2015-06-01 MED ORDER — LABETALOL HCL 100 MG PO TABS
100.0000 mg | ORAL_TABLET | Freq: Two times a day (BID) | ORAL | Status: DC
  Administered 2015-06-01 – 2015-06-08 (×12): 100 mg via ORAL
  Filled 2015-06-01 (×17): qty 1

## 2015-06-01 MED ORDER — NITROGLYCERIN 2 % TD OINT
1.0000 [in_us] | TOPICAL_OINTMENT | Freq: Once | TRANSDERMAL | Status: AC
  Administered 2015-06-01: 1 [in_us] via TOPICAL

## 2015-06-01 MED ORDER — NITROGLYCERIN 0.4 MG SL SUBL
0.4000 mg | SUBLINGUAL_TABLET | SUBLINGUAL | Status: DC | PRN
  Administered 2015-06-01 (×3): 0.4 mg via SUBLINGUAL
  Filled 2015-06-01: qty 3

## 2015-06-01 MED ORDER — ONDANSETRON HCL 4 MG PO TABS
4.0000 mg | ORAL_TABLET | Freq: Four times a day (QID) | ORAL | Status: DC | PRN
  Administered 2015-06-01: 4 mg via ORAL
  Filled 2015-06-01: qty 1

## 2015-06-01 MED ORDER — LISINOPRIL 20 MG PO TABS
20.0000 mg | ORAL_TABLET | ORAL | Status: DC
  Administered 2015-06-02 – 2015-06-07 (×6): 20 mg via ORAL
  Filled 2015-06-01 (×6): qty 1

## 2015-06-01 MED ORDER — GUAIFENESIN-CODEINE 100-10 MG/5ML PO SOLN: 10.0000 mL | Freq: Four times a day (QID) | ORAL | Status: DC | PRN

## 2015-06-01 MED ORDER — ALBUTEROL SULFATE (2.5 MG/3ML) 0.083% IN NEBU
INHALATION_SOLUTION | RESPIRATORY_TRACT | Status: AC
  Administered 2015-06-01: 2.5 mg via RESPIRATORY_TRACT
  Filled 2015-06-01: qty 3

## 2015-06-01 MED ORDER — OXYCODONE HCL 10 MG PO TABS
10.0000 mg | ORAL_TABLET | ORAL | Status: DC | PRN
  Filled 2015-06-01: qty 1

## 2015-06-01 MED ORDER — OXYCODONE HCL 5 MG PO TABS
10.0000 mg | ORAL_TABLET | ORAL | Status: DC | PRN
  Administered 2015-06-01 – 2015-06-07 (×12): 10 mg via ORAL
  Filled 2015-06-01 (×9): qty 2
  Filled 2015-06-01: qty 1
  Filled 2015-06-01 (×5): qty 2

## 2015-06-01 NOTE — ED Provider Notes (Signed)
[facility] Emergency Department Provider Note   ____________________________________________  Time seen: Approximately 11"10am I have reviewed the triage vital signs and the triage nursing note.  HISTORY  Chief Complaint Respiratory Distress and Chest Pain   Historian Patient  HPI Dawn Donaldson is a 65 y.o. female with a history of chronic COPD who is on 2 L home O2, but over the past one week she has turned it up to 3 L. She states that she saw her primary care physician this week and was given a steroid shot and antibiotic shot. She's had no significant improvement, in fact she's had some worsening in terms of her breathing, and wheezing. She states that she used for nebulizer treatment since 4 AM at home. She also had onset this morning about 2 hours prior to arrival of left upper chest and neck pain rated at 8 out of 10 currently. No nausea. No vomiting. Denies indigestion/gastrointestinal symptoms.  She states that she follows with Dr. Clayborn Bigness, cardiology for history of coronary artery disease with a stent.    Past Medical History  Diagnosis Date  . COPD (chronic obstructive pulmonary disease) ([place])     on nocturnal home o2  . Hypertension   . CAD (coronary artery disease)     s/p PCI  . Diastolic CHF ([place])   . Hyperlipidemia   . GERD (gastroesophageal reflux disease)   . Osteoporosis   . Chronic low back pain   . CVA (cerebral infarction)   . DJD (degenerative joint disease)   . Colon polyps     Patient Active Problem List   Diagnosis Date Noted  . Acute on chronic respiratory failure ([place]) 04/29/2015  . COPD exacerbation ([place]) 12/28/2014  . COPD (chronic obstructive pulmonary disease) ([place]) 05/23/2014    Past Surgical History  Procedure Laterality Date  . Carotid endarterectomy      Right  . Cholecystectomy    . Appendectomy    . Back surgery    . Partial hysterectomy      Current Outpatient Rx  Name  Route  Sig  Dispense   Refill  . acetaminophen (TYLENOL) 500 MG tablet   Oral   Take 1,000 mg by mouth every 6 (six) hours as needed for mild pain or headache.         . albuterol (PROVENTIL HFA;VENTOLIN HFA) 108 (90 BASE) MCG/ACT inhaler   Inhalation   Inhale 2 puffs into the lungs every 6 (six) hours as needed for wheezing or shortness of breath.          . clopidogrel (PLAVIX) 75 MG tablet   Oral   Take 75 mg by mouth every evening.          . COMBIVENT RESPIMAT 20-100 MCG/ACT AERS respimat   Inhalation   Inhale 1 puff into the lungs 4 (four) times daily.   1 Inhaler   2     Dispense as written.   Marland Kitchen esomeprazole (NEXIUM) 40 MG capsule   Oral   Take 40 mg by mouth 2 (two) times daily.         . Fluticasone-Salmeterol (ADVAIR) 250-50 MCG/DOSE AEPB   Inhalation   Inhale 1 puff into the lungs 2 (two) times daily.         . furosemide (LASIX) 40 MG tablet   Oral   Take 40 mg by mouth every other day.          Marland Kitchen guaiFENesin (MUCINEX) 600 MG 12 hr tablet  Oral   Take 1 tablet (600 mg total) by mouth 2 (two) times daily.   10 tablet   0   . guaiFENesin-codeine 100-10 MG/5ML syrup   Oral   Take 10 mLs by mouth every 6 (six) hours as needed for cough.   120 mL   0   . hydrochlorothiazide (HYDRODIURIL) 12.5 MG tablet   Oral   Take 12.5 mg by mouth every evening.         Marland Kitchen ipratropium-albuterol (DUONEB) 0.5-2.5 (3) MG/3ML SOLN   Nebulization   Take 3 mLs by nebulization every 4 (four) hours as needed (for wheezing and/or shortness of breath).          Marland Kitchen lisinopril (PRINIVIL,ZESTRIL) 20 MG tablet   Oral   Take 20 mg by mouth every evening.          . nitroGLYCERIN (NITROSTAT) 0.4 MG SL tablet   Sublingual   Place 0.4 mg under the tongue every 5 (five) minutes as needed for chest pain.          . Oxycodone HCl 10 MG TABS   Oral   Take 1 tablet (10 mg total) by mouth every 4 (four) hours as needed (for pain.).   20 tablet   0   . predniSONE (STERAPRED UNI-PAK 21  TAB) 10 MG (21) TBPK tablet   Oral   Take 1 tablet (10 mg total) by mouth daily. 60 mg daily, taper 10 mg daily until done   21 tablet   0   . simvastatin (ZOCOR) 20 MG tablet   Oral   Take 20 mg by mouth every evening.          . theophylline (UNIPHYL) 400 MG 24 hr tablet   Oral   Take 100 mg by mouth daily.            Allergies Dexlansoprazole and Ultram  Family History  Problem Relation Age of Onset  . Atrial fibrillation Mother   . CAD Father     Social History Social History  Substance Use Topics  . Smoking status: Former Smoker -- 0.10 packs/day    Types: Cigarettes  . Smokeless tobacco: Never Used  . Alcohol Use: No    Review of Systems  Constitutional: Negative for fever. Eyes: Negative for visual changes. ENT: Negative for sore throat. Cardiovascular: Positive for chest pain. Respiratory: Positive for shortness of breath. Gastrointestinal: Negative for abdominal pain, vomiting and diarrhea. Genitourinary: Negative for dysuria. Musculoskeletal: Negative for back pain. Skin: Negative for rash. Neurological: Negative for headache. 10 point Review of Systems otherwise negative ____________________________________________   PHYSICAL EXAM:  VITAL SIGNS: ED Triage Vitals  Enc Vitals Group     BP 06/01/15 1041 224/118 mmHg     Pulse Rate 06/01/15 1044 104     Resp 06/01/15 1044 24     Temp 06/01/15 1057 98.2 F (36.8 C)     Temp Source 06/01/15 1057 Oral     SpO2 06/01/15 1044 100 %     Weight 06/01/15 1044 200 lb (90.719 kg)     Height 06/01/15 1044 5\' 3"  (1.6 m)     Head Cir --      Peak Flow --      Pain Score 06/01/15 1045 9     Pain Loc --      Pain Edu? --      Excl. in [place]? --      Constitutional: Alert and oriented. Mild to moderate respiratory distress. HEENT  Head: Normocephalic and atraumatic.      Eyes: Conjunctivae are normal. PERRL. Normal extraocular movements.      Ears:         Nose: No congestion/rhinnorhea.    Mouth/Throat: Mucous membranes are moist.   Neck: No stridor. Cardiovascular/Chest: Tachycardic, regular rhythm.  No murmurs, rubs, or gallops. Respiratory: Mild tachypnea without retractions. Speaks in 4-5 words at a time.  Moderate to severe end expiratory wheezing in all fields. Mild decreased air movement throughout all fields. Rhonchi at bases bilaterally posteriorly. Gastrointestinal: Soft. No distention, no guarding, no rebound. Nontender.    Genitourinary/rectal:Deferred Musculoskeletal: Nontender with normal range of motion in all extremities. No joint effusions.  No lower extremity tenderness. Trace edema bilateral lower extremities Neurologic:  Normal speech and language. No gross or focal neurologic deficits are appreciated. Skin:  Skin is warm, dry and intact. No rash noted. Psychiatric: Mood and affect are normal. Speech and behavior are normal. Patient exhibits appropriate insight and judgment.  ____________________________________________   EKG I, Lisa Roca, MD, the attending physician have personally viewed and interpreted all ECGs.  EKG #1 106 bpm. Sinus tachycardia. Narrow QRS. Normal axis. Nonspecific ST/T wave.  EKG #2 98 bpm. Normal sinus rhythm. Narrow QRS. Normal axis. Nonspecific ST and T-wave, similar to previous. ____________________________________________  LABS (pertinent positives/negatives)  Labs Reviewed  CBC - Abnormal; Notable for the following:    WBC 18.6 (*)    All other components within normal limits  COMPREHENSIVE METABOLIC PANEL  TROPONIN I    ____________________________________________  RADIOLOGY All Xrays were viewed by me. Imaging interpreted by Radiologist.  Chest portable: Stable chest x-ray without evidence of acute cardiopulmonary process. __________________________________________  PROCEDURES  Procedure(s) performed: None  Critical Care performed: None  ____________________________________________   ED COURSE /  ASSESSMENT AND PLAN  Pertinent labs & imaging results that were available during my care of the patient were reviewed by me and considered in my medical decision making (see chart for details).   Clinically patient appears to be having a COPD exacerbation. She has been taking 3 L nasal cannula, and here is stable at 100% on 3 L. Patient may be decreased somewhat given her history of COPD, O2 sat for me is not 100%.  She's here for worsening/persistent trouble breathing despite outpatient therapy this week for COPD exacerbation, and I suspect patient will need hospitalization for further management/treatment of this COPD exacerbation.  In addition, patient's having unusual/atypical chest pain for her located in upper left chest and neck. Her initial EKG is reassuring, but patient will be given aspirin and nitroglycerin during cardiac workup.  On laboratory studies, her white blood cell count was elevated, and I suspect this might be due to recent steroid use as an outpatient, her chest x-ray shows no infiltrate. She does not have a fever. I did add on blood cultures and a lactate, and the lactic acid is normal.  In terms of the chest pain, repeat EKG is also reassuring/ no change. Troponin is negative.  After 3 sublingual nitroglycerin her chest pain was almost completely resolved, and she was given nitroglycerin paste and given morphine for 2 out of 10 chest discomfort. Christena Deem will be admitted for failed outpatient treatment of COPD exacerbation as well as chest pain rule out.  CONSULTATIONS:   Hospitalist for admission   Patient / Family / Caregiver informed of clinical course, medical decision-making process, and agree with plan.     ___________________________________________   FINAL CLINICAL IMPRESSION(S) / ED  DIAGNOSES   Final diagnoses:  COPD exacerbation ([place])  Chest pain, unspecified              Note: This dictation was prepared with Dragon dictation. Any  transcriptional errors that result from this process are unintentional   Lisa Roca, MD 06/01/15 1304

## 2015-06-01 NOTE — H&P (Signed)
[place] [facility] [place] NAME: Dawn Donaldson    MR#:  [id number]  DATE OF BIRTH:  1950/12/23  DATE OF ADMISSION:  06/01/2015  PRIMARY CARE PHYSICIAN: Lorelee Market, MD   REQUESTING/REFERRING PHYSICIAN: Dr. Reita Cliche  CHIEF COMPLAINT:  Increasing shortness of breath along with wheezing 2 days  HISTORY OF PRESENT ILLNESS:  Dawn Donaldson  is a 65 y.o. female with a known history of Chronic respiratory failure/emphysema on chronic 2 L nasal cannula oxygen, ex-smoker, CAD, hypertension comes to the emergency room with increasing shortness of breath wheezing and mild cough. Denies any fever. She had 4 rounds of some nebulizer treatment at home continued to wheeze came to the emergency room received IV Solu-Medrol breathing treatment and is being admitted for further evaluation and management. PAST MEDICAL HISTORY:   Past Medical History  Diagnosis Date  . COPD (chronic obstructive pulmonary disease) ([place])     on nocturnal home o2  . Hypertension   . CAD (coronary artery disease)     s/p PCI  . Diastolic CHF ([place])   . Hyperlipidemia   . GERD (gastroesophageal reflux disease)   . Osteoporosis   . Chronic low back pain   . CVA (cerebral infarction)   . DJD (degenerative joint disease)   . Colon polyps     PAST SURGICAL HISTOIRY:   Past Surgical History  Procedure Laterality Date  . Carotid endarterectomy      Right  . Cholecystectomy    . Appendectomy    . Back surgery    . Partial hysterectomy      SOCIAL HISTORY:   Social History  Substance Use Topics  . Smoking status: Former Smoker -- 0.10 packs/day    Types: Cigarettes  . Smokeless tobacco: Never Used  . Alcohol Use: No    FAMILY HISTORY:   Family History  Problem Relation Age of Onset  . Atrial fibrillation Mother   . CAD Father     DRUG ALLERGIES:   Allergies  Allergen Reactions  . Dexlansoprazole Nausea And Vomiting  . Ultram [Tramadol] Itching    REVIEW  OF SYSTEMS:  Review of Systems  Constitutional: Negative for fever, chills and weight loss.  HENT: Negative for ear discharge, ear pain and nosebleeds.   Eyes: Negative for blurred vision, pain and discharge.  Respiratory: Positive for cough, sputum production, shortness of breath and wheezing. Negative for stridor.   Cardiovascular: Negative for chest pain, palpitations, orthopnea and PND.  Gastrointestinal: Negative for nausea, vomiting, abdominal pain and diarrhea.  Genitourinary: Negative for urgency and frequency.  Musculoskeletal: Positive for joint pain. Negative for back pain.  Neurological: Negative for sensory change, speech change, focal weakness and weakness.  Psychiatric/Behavioral: Negative for depression and hallucinations. The patient is not nervous/anxious.   All other systems reviewed and are negative.    MEDICATIONS AT HOME:   Prior to Admission medications   Medication Sig Start Date End Date Taking? Authorizing Provider  acetaminophen (TYLENOL) 500 MG tablet Take 1,000 mg by mouth every 6 (six) hours as needed for mild pain or headache.   Yes Historical Provider, MD  albuterol (PROVENTIL HFA;VENTOLIN HFA) 108 (90 BASE) MCG/ACT inhaler Inhale 2 puffs into the lungs every 6 (six) hours as needed for wheezing or shortness of breath.    Yes Historical Provider, MD  COMBIVENT RESPIMAT 20-100 MCG/ACT AERS respimat Inhale 1 puff into the lungs 4 (four) times daily. 05/27/14  Yes Gladstone Lighter, MD  DALIRESP 500  MCG TABS tablet Take 500 mcg by mouth daily. 05/08/15  Yes Historical Provider, MD  Fluticasone-Salmeterol (ADVAIR) 250-50 MCG/DOSE AEPB Inhale 1 puff into the lungs 2 (two) times daily.   Yes Historical Provider, MD  furosemide (LASIX) 40 MG tablet Take 40 mg by mouth daily as needed for fluid or edema.    Yes Historical Provider, MD  guaiFENesin-codeine 100-10 MG/5ML syrup Take 10 mLs by mouth every 6 (six) hours as needed for cough. 05/02/15  Yes Dustin Flock, MD   hydrochlorothiazide (HYDRODIURIL) 12.5 MG tablet Take 12.5 mg by mouth every evening.   Yes Historical Provider, MD  ipratropium-albuterol (DUONEB) 0.5-2.5 (3) MG/3ML SOLN Take 3 mLs by nebulization every 4 (four) hours as needed (for wheezing and/or shortness of breath).    Yes Historical Provider, MD  labetalol (NORMODYNE) 100 MG tablet Take 100 mg by mouth 2 (two) times daily. 05/08/15  Yes Historical Provider, MD  lisinopril (PRINIVIL,ZESTRIL) 20 MG tablet Take 20 mg by mouth every morning.    Yes Historical Provider, MD  nitroGLYCERIN (NITROSTAT) 0.4 MG SL tablet Place 0.4 mg under the tongue every 5 (five) minutes as needed for chest pain.    Yes Historical Provider, MD  Oxycodone HCl 10 MG TABS Take 1 tablet (10 mg total) by mouth every 4 (four) hours as needed (for pain.). 02/28/15  Yes Bettey Costa, MD  theophylline (UNIPHYL) 400 MG 24 hr tablet Take 100 mg by mouth daily.    Yes Historical Provider, MD  clopidogrel (PLAVIX) 75 MG tablet Take 75 mg by mouth every evening.     Historical Provider, MD  esomeprazole (NEXIUM) 40 MG capsule Take 40 mg by mouth 2 (two) times daily.    Historical Provider, MD  guaiFENesin (MUCINEX) 600 MG 12 hr tablet Take 1 tablet (600 mg total) by mouth 2 (two) times daily. 05/02/15   Dustin Flock, MD  simvastatin (ZOCOR) 20 MG tablet Take 20 mg by mouth every evening.     Historical Provider, MD      VITAL SIGNS:  Blood pressure 155/86, pulse 96, temperature 98.2 F (36.8 C), temperature source Oral, resp. rate 19, height 5\' 3"  (1.6 m), weight 90.719 kg (200 lb), SpO2 98 %.  PHYSICAL EXAMINATION:  GENERAL:  65 y.o.-year-old patient lying in the bed with no acute distress.  EYES: Pupils equal, round, reactive to light and accommodation. No scleral icterus. Extraocular muscles intact.  HEENT: Head atraumatic, normocephalic. Oropharynx and nasopharynx clear.  NECK:  Supple, no jugular venous distention. No thyroid enlargement, no tenderness.  LUNGS: Normal  breath sounds bilaterally, ++ wheezing, no rales,rhonchi or crepitation. No use of accessory muscles of respiration.  CARDIOVASCULAR: S1, S2 normal. No murmurs, rubs, or gallops.  ABDOMEN: Soft, nontender, nondistended. Bowel sounds present. No organomegaly or mass.  EXTREMITIES: No pedal edema, cyanosis, or clubbing.  NEUROLOGIC: Cranial nerves II through XII are intact. Muscle strength 5/5 in all extremities. Sensation intact. Gait not checked.  PSYCHIATRIC: The patient is alert and oriented x 3.  SKIN: No obvious rash, lesion, or ulcer.   LABORATORY PANEL:   CBC  Recent Labs Lab 06/01/15 1051  WBC 18.6*  HGB 14.5  HCT 43.3  PLT 254   ------------------------------------------------------------------------------------------------------------------  Chemistries   Recent Labs Lab 06/01/15 1051  NA 141  K 4.0  CL 105  CO2 30  GLUCOSE 108*  BUN 18  CREATININE 0.78  CALCIUM 9.4  AST 18  ALT 14  ALKPHOS 94  BILITOT 0.4   ------------------------------------------------------------------------------------------------------------------  Cardiac Enzymes  Recent Labs Lab 06/01/15 1051  TROPONINI <0.03   ------------------------------------------------------------------------------------------------------------------  RADIOLOGY:  Dg Chest Portable 1 View  06/01/2015  CLINICAL DATA:  65 year old female with audible wheezing and respiratory distress. History of COPD on chronic home oxygen at 3 L EXAM: PORTABLE CHEST 1 VIEW COMPARISON:  Prior chest x-ray 04/29/2015 FINDINGS: Cardiac and mediastinal contours are unchanged. Atherosclerotic calcifications again noted in the transverse aorta. No evidence of pulmonary edema, new focal airspace consolidation, pleural effusion or pneumothorax. Very mild bibasilar opacities likely represent chronic change or subsegmental atelectasis. No acute osseous abnormality. IMPRESSION: Stable chest x-ray without evidence of acute  cardiopulmonary process. Electronically Signed   By: Jacqulynn Cadet M.D.   On: 06/01/2015 11:04    EKG:    IMPRESSION AND PLAN:   Dawn Donaldson  is a 64 y.o. female with a known history of Chronic respiratory failure/emphysema on chronic 2 L nasal cannula oxygen, ex-smoker, CAD, hypertension comes to the emergency room with increasing shortness of breath wheezing and mild cough.  1. acute on chronic hypoxic respiratory failure secondary to COPD exacerbation -Patient presented with a few days of wheezing, shortness of breath and mild cough -Admit to medical floor -IV Solu-Medrol, nebulizer, oral inhalers -No evidence of pneumonia. Will hold off antibiotics.  2. Leukocytosis appears reactive -No evidence of pneumonia. If her counts remain elevated consider empiric antibiotic  3. CAD Stable  continue cardiac meds  4. DVT prophylaxis subcutaneous Lovenox   All the records are reviewed and case discussed with ED provider. Management plans discussed with the patient, family and they are in agreement.  CODE STATUS: Full  TOTAL TIME TAKING CARE OF THIS PATIENT: 50  minutes.    Dawn Donaldson M.D on 06/01/2015 at 1:40 PM  Between 7am to 6pm - Pager - [phone]  After 6pm go to [email or web address] - password [email or web address] [facility]  Office  [phone]  CC: Primary care physician; Lorelee Market, MD

## 2015-06-01 NOTE — ED Notes (Signed)
Ambulatory with audible wheezing , respiratory distress, grabbing left neck and left arm pain , on chronic o2 at 3 liters, started approx 2 hours

## 2015-06-01 NOTE — ED Notes (Signed)
Dr patel notified of pt wheezing with inspiration. sats 96% on pts home o2 dose of 3 LNC. Dr patel ok with pt to floor bed. Mg sulfate ordered.Marland Kitchen

## 2015-06-02 LAB — TROPONIN I: Troponin I: <0.03 ng/mL (ref <0.031)

## 2015-06-02 MED ORDER — IPRATROPIUM-ALBUTEROL 0.5-2.5 (3) MG/3ML IN SOLN
3.0000 mL | RESPIRATORY_TRACT | Status: DC
  Administered 2015-06-02 – 2015-06-08 (×32): 3 mL via RESPIRATORY_TRACT
  Filled 2015-06-02 (×33): qty 3

## 2015-06-02 MED ORDER — LORAZEPAM 1 MG PO TABS
1.0000 mg | ORAL_TABLET | Freq: Every day | ORAL | Status: DC
  Administered 2015-06-02 – 2015-06-07 (×7): 1 mg via ORAL
  Filled 2015-06-02 (×5): qty 1
  Filled 2015-06-02 (×2): qty 2
  Filled 2015-06-02: qty 1

## 2015-06-02 MED ORDER — METHYLPREDNISOLONE SODIUM SUCC 125 MG IJ SOLR
60.0000 mg | Freq: Four times a day (QID) | INTRAMUSCULAR | Status: DC
  Administered 2015-06-02 – 2015-06-05 (×13): 60 mg via INTRAVENOUS
  Filled 2015-06-02 (×13): qty 2

## 2015-06-02 MED ORDER — MORPHINE SULFATE (PF) 2 MG/ML IV SOLN
2.0000 mg | Freq: Four times a day (QID) | INTRAVENOUS | Status: DC | PRN
  Administered 2015-06-03 – 2015-06-06 (×4): 2 mg via INTRAVENOUS
  Filled 2015-06-02 (×4): qty 1

## 2015-06-02 MED ORDER — MORPHINE SULFATE (PF) 2 MG/ML IV SOLN
2.0000 mg | INTRAVENOUS | Status: AC
Start: 2015-06-02 — End: 2015-06-02
  Administered 2015-06-02: 2 mg via INTRAVENOUS
  Filled 2015-06-02: qty 1

## 2015-06-02 NOTE — Care Management Important Message (Signed)
Important Message  Patient Details  Name: Dawn Donaldson MRN: [id number] Date of Birth: 1950/04/04   Medicare Important Message Given:  Yes    Yazhini Mcaulay A, RN 06/02/2015, 6:49 AM

## 2015-06-02 NOTE — Progress Notes (Signed)
[place] at [place] NAME: Dawn Donaldson    MR#:  [id number]  DATE OF BIRTH:  1950/06/04  SUBJECTIVE:admitted for SOB/and copd exacerbation.has lot of audible wheezing this am.but she says she feels better than yesterday.  CHIEF COMPLAINT:   Chief Complaint  Patient presents with  . Respiratory Distress  . Chest Pain    REVIEW OF SYSTEMS:   ROS CONSTITUTIONAL: No fever, fatigue or weakness.  EYES: No blurred or double vision.  EARS, NOSE, AND THROAT: No tinnitus or ear pain.  RESPIRATORY: , shortness of breath, wheezing  CARDIOVASCULAR: No chest pain, orthopnea, edema.  GASTROINTESTINAL: No nausea, vomiting, diarrhea or abdominal pain.  GENITOURINARY: No dysuria, hematuria.  ENDOCRINE: No polyuria, nocturia,  HEMATOLOGY: No anemia, easy bruising or bleeding SKIN: No rash or lesion. MUSCULOSKELETAL: No joint pain or arthritis.   NEUROLOGIC: No tingling, numbness, weakness.  PSYCHIATRY: No anxiety or depression.   DRUG ALLERGIES:   Allergies  Allergen Reactions  . Dexlansoprazole Nausea And Vomiting  . Ultram [Tramadol] Itching    VITALS:  Blood pressure 149/76, pulse 74, temperature 98 F (36.7 C), temperature source Oral, resp. rate 20, height 5\' 3"  (1.6 m), weight 90.719 kg (200 lb), SpO2 99 %.  PHYSICAL EXAMINATION:  GENERAL:  65 y.o.-year-old patient lying in the bed with no acute distress.  EYES: Pupils equal, round, reactive to light and accommodation. No scleral icterus. Extraocular muscles intact.  HEENT: Head atraumatic, normocephalic. Oropharynx and nasopharynx clear.  NECK:  Supple, no jugular venous distention. No thyroid enlargement, no tenderness.  LUNGS;Bilateral Expiratory wheezing in all lung fields., no  rales,rhonchi or crepitation. No use of accessory muscles of respiration.  CARDIOVASCULAR: S1, S2 normal. No murmurs, rubs, or gallops.  ABDOMEN: Soft, nontender, nondistended. Bowel sounds present. No  organomegaly or mass.  EXTREMITIES: No pedal edema, cyanosis, or clubbing.  NEUROLOGIC: Cranial nerves II through XII are intact. Muscle strength 5/5 in all extremities. Sensation intact. Gait not checked.  PSYCHIATRIC: The patient is alert and oriented x 3.  SKIN: No obvious rash, lesion, or ulcer.    LABORATORY PANEL:   CBC  Recent Labs Lab 06/01/15 1051  WBC 18.6*  HGB 14.5  HCT 43.3  PLT 254   ------------------------------------------------------------------------------------------------------------------  Chemistries   Recent Labs Lab 06/01/15 1051  NA 141  K 4.0  CL 105  CO2 30  GLUCOSE 108*  BUN 18  CREATININE 0.78  CALCIUM 9.4  AST 18  ALT 14  ALKPHOS 94  BILITOT 0.4   ------------------------------------------------------------------------------------------------------------------  Cardiac Enzymes  Recent Labs Lab 06/01/15 1051  TROPONINI <0.03   ------------------------------------------------------------------------------------------------------------------  RADIOLOGY:  Dg Chest Portable 1 View  06/01/2015  CLINICAL DATA:  65 year old female with audible wheezing and respiratory distress. History of COPD on chronic home oxygen at 3 L EXAM: PORTABLE CHEST 1 VIEW COMPARISON:  Prior chest x-ray 04/29/2015 FINDINGS: Cardiac and mediastinal contours are unchanged. Atherosclerotic calcifications again noted in the transverse aorta. No evidence of pulmonary edema, new focal airspace consolidation, pleural effusion or pneumothorax. Very mild bibasilar opacities likely represent chronic change or subsegmental atelectasis. No acute osseous abnormality. IMPRESSION: Stable chest x-ray without evidence of acute cardiopulmonary process. Electronically Signed   By: Jacqulynn Cadet M.D.   On: 06/01/2015 11:04    EKG:   Orders placed or performed during the hospital encounter of 06/01/15  . EKG 12-Lead  . EKG 12-Lead    ASSESSMENT AND PLAN:  1.acute on  chronic respiratory failure  due to copd exacerbation;still very wheezy; increased solumedrol,duoneb q 4,o2,consider pulmonology consult  2.CAD;stable    All the records are reviewed and case discussed with Care Management/Social Workerr. Management plans discussed with the patient, family and they are in agreement.  CODE STATUS: full  TOTAL TIME TAKING CARE OF THIS PATIENT: 35 min.   POSSIBLE D/C IN 1-2  DAYS, DEPENDING ON CLINICAL CONDITION.   Epifanio Lesches M.D on 06/02/2015 at 8:24 AM  Between 7am to 6pm - Pager - [phone]  After 6pm go to [email or web address] - password [email or web address] [facility]  Office  [phone]  CC: Primary care physician; Lorelee Market, MD   Note: This dictation was prepared with Dragon dictation along with smaller phrase technology. Any transcriptional errors that result from this process are unintentional.

## 2015-06-02 NOTE — Progress Notes (Addendum)
Pt extremely short of breath with normal oxygen saturation after walking to the bathroom. PRN breathing treatments given, schedule IV Solumedrol given, 1 mg PO Ativan given for anxiety. Patient improving, continue to assess.

## 2015-06-02 NOTE — Care Management Note (Addendum)
Case Management Note  Patient Details  Name: Dawn Donaldson MRN: [id number] Date of Birth: 1950/04/28  Subjective/Objective:     65yo Mrs Dawn Donaldson was admitted 06/01/15 per a COPD exacerbation. Resides with her significant other "Dawn Donaldson."  PCP= Dr Vella Kohler. [facility]. Home equipment includes a BSC, a shower chair, and a cane. Refused offer of a RW. Is requesting a motorized wheelchair ( Hover-round) and was advised to discuss this with her PCP. Chronic 2L N/C provided by [facility].  Dawn Donaldson chose [facility] to be her provider if home health services are ordered. "Dawn Donaldson" will provide transportation. Case management following for discharge planning.                 Action/Plan:   Expected Discharge Date:                  Expected Discharge Plan:     In-House Referral:     Discharge planning Services     Post Acute Care Choice:    Choice offered to:     DME Arranged:    DME Agency:     HH Arranged:    [place] Agency:     Status of Service:     Medicare Important Message Given:  Yes Date Medicare IM Given:    Medicare IM give by:    Date Additional Medicare IM Given:    Additional Medicare Important Message give by:     If discussed at [place] of Stay Meetings, dates discussed:    Additional Comments:  Vandella Ord A, RN 06/02/2015, 11:21 AM

## 2015-06-03 ENCOUNTER — Inpatient Hospital Stay: Payer: Medicare Other

## 2015-06-03 DIAGNOSIS — J9621 Acute and chronic respiratory failure with hypoxia: Principal | ICD-10-CM

## 2015-06-03 LAB — BLOOD GAS, ARTERIAL
Acid-Base Excess: 5.1 mmol/L — ABNORMAL HIGH (ref 0.0–3.0)
Bicarbonate: 32.6 mEq/L — ABNORMAL HIGH (ref 21.0–28.0)
FIO2: 32
O2 SAT: 59.9 %
PCO2 ART: 59 mmHg — AB (ref 32.0–48.0)
PH ART: 7.35 (ref 7.350–7.450)
PO2 ART: 33 mmHg — AB (ref 83.0–108.0)
Patient temperature: 37

## 2015-06-03 LAB — MRSA PCR SCREENING: MRSA BY PCR: NEGATIVE

## 2015-06-03 MED ORDER — ALBUTEROL SULFATE (2.5 MG/3ML) 0.083% IN NEBU
10.0000 mg | INHALATION_SOLUTION | Freq: Once | RESPIRATORY_TRACT | Status: AC
  Administered 2015-06-03: 10 mg via RESPIRATORY_TRACT
  Filled 2015-06-03: qty 12

## 2015-06-03 MED ORDER — PSEUDOEPHEDRINE HCL 30 MG PO TABS
30.0000 mg | ORAL_TABLET | Freq: Three times a day (TID) | ORAL | Status: DC
  Administered 2015-06-03 – 2015-06-05 (×6): 30 mg via ORAL
  Filled 2015-06-03 (×11): qty 1

## 2015-06-03 MED ORDER — LORAZEPAM 2 MG/ML IJ SOLN
INTRAMUSCULAR | Status: AC
  Administered 2015-06-03: 1 mg via INTRAVENOUS
  Filled 2015-06-03: qty 1

## 2015-06-03 MED ORDER — ALBUTEROL (5 MG/ML) CONTINUOUS INHALATION SOLN: 10.0000 mg/h | INHALATION_SOLUTION | RESPIRATORY_TRACT | Status: DC

## 2015-06-03 MED ORDER — PIPERACILLIN-TAZOBACTAM 3.375 G IVPB
3.3750 g | Freq: Three times a day (TID) | INTRAVENOUS | Status: DC
  Administered 2015-06-03 – 2015-06-07 (×12): 3.375 g via INTRAVENOUS
  Filled 2015-06-03 (×16): qty 50

## 2015-06-03 MED ORDER — FUROSEMIDE 10 MG/ML IJ SOLN
20.0000 mg | Freq: Once | INTRAMUSCULAR | Status: AC
Start: 2015-06-03 — End: 2015-06-03
  Administered 2015-06-03: 20 mg via INTRAVENOUS
  Filled 2015-06-03: qty 2

## 2015-06-03 MED ORDER — THEOPHYLLINE ER 200 MG PO CP24
200.0000 mg | ORAL_CAPSULE | Freq: Every day | ORAL | Status: DC
  Administered 2015-06-04 – 2015-06-08 (×5): 200 mg via ORAL
  Filled 2015-06-03 (×5): qty 1

## 2015-06-03 MED ORDER — AZITHROMYCIN 500 MG IV SOLR
500.0000 mg | INTRAVENOUS | Status: DC
Start: 2015-06-03 — End: 2015-06-06
  Administered 2015-06-03 – 2015-06-06 (×4): 500 mg via INTRAVENOUS
  Filled 2015-06-03 (×5): qty 500

## 2015-06-03 MED ORDER — LORAZEPAM 2 MG/ML IJ SOLN
1.0000 mg | Freq: Four times a day (QID) | INTRAMUSCULAR | Status: DC | PRN
  Administered 2015-06-03: 1 mg via INTRAVENOUS

## 2015-06-03 NOTE — Progress Notes (Signed)
Pt wheezing and had to have a neb treatment within two hours of last scheduled treatment. Continue to monitor.

## 2015-06-03 NOTE — Consult Note (Addendum)
[facility] Consultation     ADDENDUM:  Blood gas reviewed; appears to have compensated hypercapnia, will maintain pt on bipap qhs, continue to monitor.   06/03/2015 11:24 AM Dawn Donaldson    Assessment  -Acute exacerbation of COPD, with acute bronchitis. -Review of the chest x-ray shows chronic changes of hyperinflation/COPD. -Chronic respiratory failure on home oxygen with severe baseline emphysema, baseline FEV1 is 28% of predicted.  Plan:  -Stat ABG has artery been ordered, will review results, if the patient appears to be hypercapnic and decompensated then will require BiPAP, which will require transfer to the intensive care unit.-We will await ABG results. Otherwise, the patient's oxygen saturation is currently 94% on 2 L nasal cannula, and can be watched closely on the floor if her rest her status is compensated. -Continue high-dose IV steroids, nebulizers, Dulera. -Continue PPI. -Dose of Lasix 1.  If the patient will remain on the general medical floor, recommend consultation with the patient's primary pulmonologist, Dr. Raul Del.  Date: 06/03/2015  MRN# [id number] Dawn Donaldson 1950/05/10  Referring Physician: Dr. Vianne Bulls.   Dawn Donaldson is a 65 y.o. old female seen in consultation for chief complaint of:    Chief Complaint  Patient presents with  . Respiratory Distress  . Chest Pain    HPI:   Patient is a 65 year old female, former smoker. She has a known history of COPD, she was seen in the hospital in March with COPD exacerbation. She had been doing fairly well since that time, she normally sees Dr. Raul Del as her pulmonary physician. We're consulted today on urgent basis due to increasing respiratory difficulty, possible ICU admission. Per Dr. Gust Brooms last note, her last FEV1 was 0.6 L, 28% of predicted. She was not able to tolerate her daliresp. He also is known to have a history of sleep apnea, on CPAP at night. Patient is on  home Advair and oxygen Currently free. She presents to the hospital with 2-3 days of increasing dyspnea. Subsequently, her chest x-ray shows chronic changes of COPD, there is no evidence of pneumonia. The patient has been having diffuse expiratory wheezing since arrival    PMHX:   Past Medical History  Diagnosis Date  . COPD (chronic obstructive pulmonary disease) ([place])     on nocturnal home o2  . Hypertension   . CAD (coronary artery disease)     s/p PCI  . Diastolic CHF ([place])   . Hyperlipidemia   . GERD (gastroesophageal reflux disease)   . Osteoporosis   . Chronic low back pain   . CVA (cerebral infarction)   . DJD (degenerative joint disease)   . Colon polyps    Surgical Hx:  Past Surgical History  Procedure Laterality Date  . Carotid endarterectomy      Right  . Cholecystectomy    . Appendectomy    . Back surgery    . Partial hysterectomy     Family Hx:  Family History  Problem Relation Age of Onset  . Atrial fibrillation Mother   . CAD Father    Social Hx:   Social History  Substance Use Topics  . Smoking status: Former Smoker -- 0.10 packs/day    Types: Cigarettes  . Smokeless tobacco: Never Used  . Alcohol Use: No   Medication:   No current outpatient prescriptions on file.    Allergies:  Dexlansoprazole and Ultram  Review of Systems: Gen:  Denies  fever, sweats, chills HEENT: Denies blurred vision, double vision. bleeds, sore  throat Cvc:  No dizziness, chest pain. Resp:   Denies cough or sputum production, shortness of breath Gi: Denies swallowing difficulty, stomach pain. Gu:  Denies bladder incontinence, burning urine Ext:   No Joint pain, stiffness. Skin: No skin rash,  hives  Endoc:  No polyuria, polydipsia. Psych: No depression, insomnia. Other:  All other systems were reviewed with the patient and were negative other that what is mentioned in the HPI.   Physical Examination:   VS: BP 121/63 mmHg  Pulse 70  Temp(Src) 98.3 F (36.8  C) (Oral)  Resp 22  Ht 5\' 3"  (1.6 m)  Wt 200 lb (90.719 kg)  BMI 35.44 kg/m2  SpO2 100%  General Appearance: No distress  Neuro:without focal findings,  speech normal,  HEENT: PERRLA, EOM intact.   Pulmonary: normal breath sounds, diffuse bilateral extra wheezing, this appears improved when the patient is talking. CardiovascularNormal S1,S2.  No m/r/g.   Abdomen: Benign, Soft, non-tender. Renal:  No costovertebral tenderness  GU:  No performed at this time. Endoc: No evident thyromegaly, no signs of acromegaly. Skin:   warm, no rashes, no ecchymosis  Extremities: normal, no cyanosis, clubbing.  Other findings:    LABORATORY PANEL:   CBC  Recent Labs Lab 06/01/15 1051  WBC 18.6*  HGB 14.5  HCT 43.3  PLT 254   ------------------------------------------------------------------------------------------------------------------  Chemistries   Recent Labs Lab 06/01/15 1051  NA 141  K 4.0  CL 105  CO2 30  GLUCOSE 108*  BUN 18  CREATININE 0.78  CALCIUM 9.4  AST 18  ALT 14  ALKPHOS 94  BILITOT 0.4   ------------------------------------------------------------------------------------------------------------------  Cardiac Enzymes  Recent Labs Lab 06/02/15 1528  TROPONINI <0.03   ------------------------------------------------------------  RADIOLOGY:  Dg Chest 1 View  06/03/2015  CLINICAL DATA:  History of COPD, CHF, chronic shortness of breath with increased symptoms today. EXAM: CHEST 1 VIEW COMPARISON:  Portable chest x-ray of Jun 01, 2015 FINDINGS: The lungs remain hyperinflated but are clear. There is no pneumothorax, pneumomediastinum, or pleural effusion. The heart and pulmonary vascularity are normal. The trachea is midline. The bony thorax exhibits no acute abnormality. IMPRESSION: COPD.  There is no active cardiopulmonary disease. Electronically Signed   By: David  Martinique M.D.   On: 06/03/2015 09:04   Dg Chest Portable 1 View  06/01/2015  CLINICAL  DATA:  65 year old female with audible wheezing and respiratory distress. History of COPD on chronic home oxygen at 3 L EXAM: PORTABLE CHEST 1 VIEW COMPARISON:  Prior chest x-ray 04/29/2015 FINDINGS: Cardiac and mediastinal contours are unchanged. Atherosclerotic calcifications again noted in the transverse aorta. No evidence of pulmonary edema, new focal airspace consolidation, pleural effusion or pneumothorax. Very mild bibasilar opacities likely represent chronic change or subsegmental atelectasis. No acute osseous abnormality. IMPRESSION: Stable chest x-ray without evidence of acute cardiopulmonary process. Electronically Signed   By: Jacqulynn Cadet M.D.   On: 06/01/2015 11:04       Thank  you for the consultation and for allowing [facility] to assist in the care of your patient. Our recommendations are noted above.  Please contact us if we can be of further service.   Marda Stalker, MD.  Board Certified in Internal Medicine, Pulmonary Medicine, [place], and Sleep Medicine.  [facility] Office Number: [phone]  Patricia Pesa, M.D.  Vilinda Boehringer, M.D.  Merton Border, M.D  [id number]

## 2015-06-03 NOTE — Progress Notes (Signed)
Pharmacy Antibiotic Note  Dawn Donaldson is a 65 y.o. female admitted on 06/01/2015 with respiratory failure, COPD, r/o pneumonia.  Pharmacy has been consulted for Zosyn dosing. Patient is also on azithromycin.   Plan: Zosyn 3.375g IV q8h (4 hour infusion).  Height: 5\' 2"  (157.5 cm) Weight: 211 lb 3.2 oz (95.8 kg) IBW/kg (Calculated) : 50.1  Temp (24hrs), Avg:98.2 F (36.8 C), Min:97.8 F (36.6 C), Max:98.6 F (37 C)   Recent Labs Lab 06/01/15 1051 06/01/15 1149 06/01/15 1755  WBC 18.6*  --   --   CREATININE 0.78  --   --   LATICACIDVEN  --  0.8 1.6    Estimated Creatinine Clearance: 77.7 mL/min (by C-G formula based on Cr of 0.78).    Allergies  Allergen Reactions  . Dexlansoprazole Nausea And Vomiting  . Ultram [Tramadol] Itching    Antimicrobials this admission: azithromycin 5/30 >>  Zosyn 5/30 >>   Dose adjustments this admission:   Microbiology results: 5/28 BCx: NGTD x 2  MRSA PCR: pending  Thank you for allowing pharmacy to be a part of this patient's care.  Ulice Dash D 06/03/2015 1:58 PM

## 2015-06-03 NOTE — Progress Notes (Signed)
[facility] at [place] NAME: Dawn Donaldson    MR#:  [id number]  DATE OF BIRTH:  1950/09/18  SUBJECTIVE:admitted for SOB/and copd exacerbation.she is tripoding and using accessory respiration. O2 saturation this morning 90% on 2 L. Still lots of wheezing and cough.   CHIEF COMPLAINT:   Chief Complaint  Patient presents with  . Respiratory Distress  . Chest Pain    REVIEW OF SYSTEMS:   ROS CONSTITUTIONAL: No fever, fatigue or weakness.  EYES: No blurred or double vision.  EARS, NOSE, AND THROAT: No tinnitus or ear pain.  RESPIRATORY: , shortness of breath, wheezing , Tripoding today CARDIOVASCULAR: No chest pain, orthopnea, edema.  GASTROINTESTINAL: No nausea, vomiting, diarrhea or abdominal pain.  GENITOURINARY: No dysuria, hematuria.  ENDOCRINE: No polyuria, nocturia,  HEMATOLOGY: No anemia, easy bruising or bleeding SKIN: No rash or lesion. MUSCULOSKELETAL: No joint pain or arthritis.   NEUROLOGIC: No tingling, numbness, weakness.  PSYCHIATRY: No anxiety or depression.   DRUG ALLERGIES:   Allergies  Allergen Reactions  . Dexlansoprazole Nausea And Vomiting  . Ultram [Tramadol] Itching    VITALS:  Blood pressure 121/63, pulse 70, temperature 98.3 F (36.8 C), temperature source Oral, resp. rate 22, height 5\' 3"  (1.6 m), weight 90.719 kg (200 lb), SpO2 100 %.  PHYSICAL EXAMINATION:  GENERAL:  65 y.o.-year-old patient lying in the bed with no acute distress. Patient is using accessory muscles of respiration. EYES: Pupils equal, round, reactive to light and accommodation. No scleral icterus. Extraocular muscles intact.  HEENT: Head atraumatic, normocephalic. Oropharynx and nasopharynx clear.  NECK:  Supple, no jugular venous distention. No thyroid enlargement, no tenderness.  LUNGS;Bilateral Expiratory wheezing in all lung fields., no  rales,rhonchi or crepitation. No use of accessory muscles of respiration.  CARDIOVASCULAR:  S1, S2 normal. No murmurs, rubs, or gallops.  ABDOMEN: Soft, nontender, nondistended. Bowel sounds present. No organomegaly or mass.  EXTREMITIES: No pedal edema, cyanosis, or clubbing.  NEUROLOGIC: Cranial nerves II through XII are intact. Muscle strength 5/5 in all extremities. Sensation intact. Gait not checked.  PSYCHIATRIC: The patient is alert and oriented x 3.  SKIN: No obvious rash, lesion, or ulcer.    LABORATORY PANEL:   CBC  Recent Labs Lab 06/01/15 1051  WBC 18.6*  HGB 14.5  HCT 43.3  PLT 254   ------------------------------------------------------------------------------------------------------------------  Chemistries   Recent Labs Lab 06/01/15 1051  NA 141  K 4.0  CL 105  CO2 30  GLUCOSE 108*  BUN 18  CREATININE 0.78  CALCIUM 9.4  AST 18  ALT 14  ALKPHOS 94  BILITOT 0.4   ------------------------------------------------------------------------------------------------------------------  Cardiac Enzymes  Recent Labs Lab 06/02/15 1528  TROPONINI <0.03   ------------------------------------------------------------------------------------------------------------------  RADIOLOGY:  Dg Chest Portable 1 View  06/01/2015  CLINICAL DATA:  65 year old female with audible wheezing and respiratory distress. History of COPD on chronic home oxygen at 3 L EXAM: PORTABLE CHEST 1 VIEW COMPARISON:  Prior chest x-ray 04/29/2015 FINDINGS: Cardiac and mediastinal contours are unchanged. Atherosclerotic calcifications again noted in the transverse aorta. No evidence of pulmonary edema, new focal airspace consolidation, pleural effusion or pneumothorax. Very mild bibasilar opacities likely represent chronic change or subsegmental atelectasis. No acute osseous abnormality. IMPRESSION: Stable chest x-ray without evidence of acute cardiopulmonary process. Electronically Signed   By: Jacqulynn Cadet M.D.   On: 06/01/2015 11:04    EKG:   Orders placed or performed during  the hospital encounter of 06/01/15  . EKG 12-Lead  .  EKG 12-Lead  . EKG 12-Lead  . EKG 12-Lead    ASSESSMENT AND PLAN:  #1 acute on chronic respiratory failure with severe COPD exacerbation: Patient still has lots of wheezing and also using accessory muscles of respiration this morning. We will get another x-ray of the chest, ABG, continue Solu-Medrol, nebulizers. I increased the dose of the of  Theophylline,  #2. chronic respiratory failure, severe emphysema: Patient is on now Dulera, theophylline, DAliresp . 3.chest pain; controlled due to respiratory distress. Please hold off on morphine or Ativan because it can cause respiratory depression ./chest pain for 2 times. 4. History of CAD: Patient is on statins, nitrates, Plavix. #5 chronic nausea patient takes ondansetron with relief. Continue ondansetron. The patient, patient's  Husband. Also discussed with patient's nurse.  All the records are reviewed and case discussed with Care Management/Social Workerr. Management plans discussed with the patient, family and they are in agreement.  CODE STATUS: full  TOTAL TIME TAKING CARE OF THIS PATIENT: 35 min.   POSSIBLE D/C IN 1-2  DAYS, DEPENDING ON CLINICAL CONDITION.   Epifanio Lesches M.D on 06/03/2015 at 8:45 AM  Between 7am to 6pm - Pager - [phone]  After 6pm go to [email or web address] - password [email or web address] [facility]  Office  [phone]  CC: Primary care physician; Lorelee Market, MD   Note: This dictation was prepared with Dragon dictation along with smaller phrase technology. Any transcriptional errors that result from this process are unintentional.

## 2015-06-04 ENCOUNTER — Inpatient Hospital Stay: Payer: Medicare Other

## 2015-06-04 LAB — BASIC METABOLIC PANEL
Anion gap: 6 (ref 5–15)
BUN: 30 mg/dL — AB (ref 6–20)
CHLORIDE: 99 mmol/L — AB (ref 101–111)
CO2: 32 mmol/L (ref 22–32)
Calcium: 9.1 mg/dL (ref 8.9–10.3)
Creatinine, Ser: 0.97 mg/dL (ref 0.44–1.00)
GFR calc non Af Amer: >60 mL/min (ref >60)
Glucose, Bld: 143 mg/dL — ABNORMAL HIGH (ref 65–99)
Potassium: 4.2 mmol/L (ref 3.5–5.1)
SODIUM: 137 mmol/L (ref 135–145)

## 2015-06-04 LAB — BLOOD GAS, ARTERIAL
ACID-BASE EXCESS: 8.4 mmol/L — AB (ref 0.0–3.0)
ALLENS TEST (PASS/FAIL): POSITIVE — AB
BICARBONATE: 34.9 meq/L — AB (ref 21.0–28.0)
Delivery systems: POSITIVE
Expiratory PAP: 8
FIO2: 0.3
Inspiratory PAP: 15
MECHANICAL RATE: 12
O2 Saturation: 92.6 %
PATIENT TEMPERATURE: 37
PH ART: 7.41 (ref 7.350–7.450)
PO2 ART: 65 mmHg — AB (ref 83.0–108.0)
pCO2 arterial: 55 mmHg — ABNORMAL HIGH (ref 32.0–48.0)

## 2015-06-04 LAB — CBC
HCT: 38.8 % (ref 35.0–47.0)
HEMOGLOBIN: 12.7 g/dL (ref 12.0–16.0)
MCH: 31.2 pg (ref 26.0–34.0)
MCHC: 32.8 g/dL (ref 32.0–36.0)
MCV: 95 fL (ref 80.0–100.0)
PLATELETS: 224 10*3/uL (ref 150–440)
RBC: 4.08 MIL/uL (ref 3.80–5.20)
RDW: 14 % (ref 11.5–14.5)
WBC: 16.3 10*3/uL — ABNORMAL HIGH (ref 3.6–11.0)

## 2015-06-04 LAB — GLUCOSE, CAPILLARY
GLUCOSE-CAPILLARY: 143 mg/dL — AB (ref 65–99)
Glucose-Capillary: 140 mg/dL — ABNORMAL HIGH (ref 65–99)
Glucose-Capillary: 130 mg/dL — ABNORMAL HIGH (ref 65–99)

## 2015-06-04 MED ORDER — INSULIN ASPART 100 UNIT/ML ~~LOC~~ SOLN
2.0000 [IU] | Freq: Three times a day (TID) | SUBCUTANEOUS | Status: DC
  Administered 2015-06-04 – 2015-06-05 (×3): 2 [IU] via SUBCUTANEOUS
  Administered 2015-06-05 – 2015-06-07 (×3): 4 [IU] via SUBCUTANEOUS
  Administered 2015-06-07: 17:00:00 2 [IU] via SUBCUTANEOUS
  Filled 2015-06-04: qty 2
  Filled 2015-06-04 (×2): qty 4
  Filled 2015-06-04: qty 2
  Filled 2015-06-04: qty 4
  Filled 2015-06-04: qty 2
  Filled 2015-06-04: qty 4

## 2015-06-04 NOTE — Progress Notes (Signed)
[place] at [place] NAME: Dawn Donaldson    MR#:  [id number]  DATE OF BIRTH:  04-12-1950  SUBJECTIVE;Feeling little better.transferred to icu yesterday.on bipap at night.   CHIEF COMPLAINT:   Chief Complaint  Patient presents with  . Respiratory Distress  . Chest Pain    REVIEW OF SYSTEMS:   ROS CONSTITUTIONAL: No fever, fatigue or weakness.  EYES: No blurred or double vision.  EARS, NOSE, AND THROAT: No tinnitus or ear pain.  RESPIRATORY: , shortness of breath, wheezing , better CARDIOVASCULAR: No chest pain, orthopnea, edema.  GASTROINTESTINAL: No nausea, vomiting, diarrhea or abdominal pain.  GENITOURINARY: No dysuria, hematuria.  ENDOCRINE: No polyuria, nocturia,  HEMATOLOGY: No anemia, easy bruising or bleeding SKIN: No rash or lesion. MUSCULOSKELETAL: No joint pain or arthritis.   NEUROLOGIC: No tingling, numbness, weakness.  PSYCHIATRY: No anxiety or depression.   DRUG ALLERGIES:   Allergies  Allergen Reactions  . Dexlansoprazole Nausea And Vomiting  . Ultram [Tramadol] Itching    VITALS:  Blood pressure 115/93, pulse 78, temperature 98.7 F (37.1 C), temperature source Oral, resp. rate 22, height 5\' 2"  (1.575 m), weight 95.8 kg (211 lb 3.2 oz), SpO2 98 %.  PHYSICAL EXAMINATION:  GENERAL:  65 y.o.-year-old patient lying in the bed with no acute distress. EYES: Pupils equal, round, reactive to light and accommodation. No scleral icterus. Extraocular muscles intact.  HEENT: Head atraumatic, normocephalic. Oropharynx and nasopharynx clear.  NECK:  Supple, no jugular venous distention. No thyroid enlargement, no tenderness.  LUNGS;Bilateral Expiratory wheezing in all lung fields., no  rales,rhonchi or crepitation. No use of accessory muscles of respiration.  CARDIOVASCULAR: S1, S2 normal. No murmurs, rubs, or gallops.  ABDOMEN: Soft, nontender, nondistended. Bowel sounds present. No organomegaly or mass.   EXTREMITIES: No pedal edema, cyanosis, or clubbing.  NEUROLOGIC: Cranial nerves II through XII are intact. Muscle strength 5/5 in all extremities. Sensation intact. Gait not checked.  PSYCHIATRIC: The patient is alert and oriented x 3.  SKIN: No obvious rash, lesion, or ulcer.    LABORATORY PANEL:   CBC  Recent Labs Lab 06/04/15 0325  WBC 16.3*  HGB 12.7  HCT 38.8  PLT 224   ------------------------------------------------------------------------------------------------------------------  Chemistries   Recent Labs Lab 06/01/15 1051 06/04/15 0325  NA 141 137  K 4.0 4.2  CL 105 99*  CO2 30 32  GLUCOSE 108* 143*  BUN 18 30*  CREATININE 0.78 0.97  CALCIUM 9.4 9.1  AST 18  --   ALT 14  --   ALKPHOS 94  --   BILITOT 0.4  --    ------------------------------------------------------------------------------------------------------------------  Cardiac Enzymes  Recent Labs Lab 06/02/15 1528  TROPONINI <0.03   ------------------------------------------------------------------------------------------------------------------  RADIOLOGY:  Dg Chest 1 View  06/04/2015  CLINICAL DATA:  Shortness of Breath EXAM: CHEST 1 VIEW COMPARISON:  Jun 03, 2015 FINDINGS: There is no edema or consolidation. The heart size and pulmonary vascularity are normal. No adenopathy. No bone lesions. IMPRESSION: No edema or consolidation. Electronically Signed   By: Lowella Grip III M.D.   On: 06/04/2015 08:06   Dg Chest 1 View  06/03/2015  CLINICAL DATA:  History of COPD, CHF, chronic shortness of breath with increased symptoms today. EXAM: CHEST 1 VIEW COMPARISON:  Portable chest x-ray of Jun 01, 2015 FINDINGS: The lungs remain hyperinflated but are clear. There is no pneumothorax, pneumomediastinum, or pleural effusion. The heart and pulmonary vascularity are normal. The trachea is midline. The bony  thorax exhibits no acute abnormality. IMPRESSION: COPD.  There is no active cardiopulmonary  disease. Electronically Signed   By: David  Martinique M.D.   On: 06/03/2015 09:04    EKG:   Orders placed or performed during the hospital encounter of 06/01/15  . EKG 12-Lead  . EKG 12-Lead  . EKG 12-Lead  . EKG 12-Lead    ASSESSMENT AND PLAN:  #1 acute on chronic respiratory failure with severe COPD exacerbation: hypercapniec : ABG  Showing co2 elevation.   transferred to icu yesterday for r worsening respiratory status.bipap at night.,continue IV steroids #2. chronic respiratory failure, severe emphysema: Patient is on now Dulera, theophylline, DAliresp .started on epiric abx. 3.chest pain; controlled due to respiratory distress.  4. History of CAD: Patient is on statins, nitrates, Plavix. #5 chronic nausea patient takes ondansetron with relief. Continue ondansetron. The patient, patient's  Husband. Also discussed with patient's nurse.  All the records are reviewed and case discussed with Care Management/Social Workerr. Management plans discussed with the patient, family and they are in agreement.  CODE STATUS: full  TOTAL TIME TAKING CARE OF THIS PATIENT: 35 min.   POSSIBLE D/C IN 1-2  DAYS, DEPENDING ON CLINICAL CONDITION.   Epifanio Lesches M.D on 06/04/2015 at 11:53 PM  Between 7am to 6pm - Pager - [phone]  After 6pm go to [email or web address] - password [email or web address] [facility]  Office  [phone]  CC: Primary care physician; Lorelee Market, MD   Note: This dictation was prepared with Dragon dictation along with smaller phrase technology. Any transcriptional errors that result from this process are unintentional.

## 2015-06-04 NOTE — Progress Notes (Signed)
[facility] Progess Note    ASSESSMENT/PLAN     PULMONARY A:-Acute exacerbation of COPD, with acute bronchitis. -Review of the chest x-ray shows chronic changes of hyperinflation/COPD. -Chronic respiratory failure on home oxygen with severe baseline emphysema, baseline FEV1 is 28% of predicted. -ABG reviewed today: 7.41/55/65/34.9: Consistent with chronic hypercapnic respiratory failure, compensated.  P:   Will wean off BiPAP as tolerated today, the patient will continue to require BiPAP daily at bedtime. -Though I do not see definitive evidence of pneumonia on her chest x-ray, agree with continued broad-spectrum antibiotics for empiric treatment of pneumonia. Given her severity. --Continue high-dose IV steroids, though will decrease the dose slightly, continue nebulizers, Dulera. -Continue PPI. -Continue Daliresp, theophylline, pseudoephedrine.  CARDIOVASCULAR A: -- P:  --  RENAL A:  --  GASTROINTESTINAL A:  On Protonix.  HEMATOLOGIC A:  Leukocytosis, likely secondary to steroids. P:  --  INFECTIOUS A:  -- P:    Micro/culture results:  MRSA PCR 5/30: Negative BCx2 5/28: Pending UC .-- Sputum--  Antibiotics: Azithromycin 5/30>> Zosyn 5/30>>  ENDOCRINE A:  Continue monitoring of blood glucose levels. P:   Accu-Cheks with sliding scale insulin before meals and at bedtime.  NEUROLOGIC A:  --   MAJOR EVENTS/TEST RESULTS:   Best Practices  DVT Prophylaxis: Enoxaparin GI Prophylaxis: Oral diet.   ---------------------------------------   ----------------------------------------   Name: Dawn Donaldson MRN: [id number] DOB: [birth year]    ADMISSION DATE:  06/01/2015   SUBJECTIVE:   Patient is feeling and looking better today.  Review of Systems:  Constitutional: Feels well. Cardiovascular: No chest pain.  Pulmonary: Denies cough The remainder of systems were reviewed and were found to be negative other than what  is documented in the HPI.    VITAL SIGNS: Temp:  [97.7 F (36.5 C)-98.3 F (36.8 C)] 97.7 F (36.5 C) (05/31 0800) Pulse Rate:  [64-82] 72 (05/31 0800) Resp:  [13-26] 18 (05/31 0800) BP: (95-132)/(52-76) 126/64 mmHg (05/31 0800) SpO2:  [91 %-100 %] 93 % (05/31 0800) FiO2 (%):  [30 %] 30 % (05/31 0322) Weight:  [211 lb 3.2 oz (95.8 kg)] 211 lb 3.2 oz (95.8 kg) (05/30 1310) HEMODYNAMICS:   VENTILATOR SETTINGS: Vent Mode:  [-]  FiO2 (%):  [30 %] 30 % INTAKE / OUTPUT:  Intake/Output Summary (Last 24 hours) at 06/04/15 0834 Last data filed at 06/04/15 0504  Gross per 24 hour  Intake    640 ml  Output   1100 ml  Net   -460 ml    PHYSICAL EXAMINATION: Physical Examination:   VS: BP 126/64 mmHg  Pulse 72  Temp(Src) 97.7 F (36.5 C) (Axillary)  Resp 18  Ht 5\' 2"  (1.575 m)  Wt 211 lb 3.2 oz (95.8 kg)  BMI 38.62 kg/m2  SpO2 93%  General Appearance: No distress  Neuro:without focal findings, mental status normal. HEENT: PERRLA, EOM intact. Pulmonary: Diffuse bilateral wheezing, right worse than left. CardiovascularNormal S1,S2.  No m/r/g.   Abdomen: Benign, Soft, non-tender. Renal:  No costovertebral tenderness  GU:  Not performed at this time. Endocrine: No evident thyromegaly. Skin:   warm, no rashes, no ecchymosis  Extremities: normal, no cyanosis, clubbing.   LABS:   LABORATORY PANEL:   CBC  Recent Labs Lab 06/04/15 0325  WBC 16.3*  HGB 12.7  HCT 38.8  PLT 224    Chemistries   Recent Labs Lab 06/01/15 1051 06/04/15 0325  NA 141 137  K 4.0 4.2  CL 105 99*  CO2 30 32  GLUCOSE 108* 143*  BUN 18 30*  CREATININE 0.78 0.97  CALCIUM 9.4 9.1  AST 18  --   ALT 14  --   ALKPHOS 94  --   BILITOT 0.4  --     No results for input(s): GLUCAP in the last 168 hours.  Recent Labs Lab 06/03/15 0842 06/04/15 0400  PHART 7.35 7.41  PCO2ART 59* 55*  PO2ART 33* 65*    Recent Labs Lab 06/01/15 1051  AST 18  ALT 14  ALKPHOS 94  BILITOT 0.4    ALBUMIN 4.2    Cardiac Enzymes  Recent Labs Lab 06/02/15 1528  TROPONINI <0.03    RADIOLOGY:  Dg Chest 1 View  06/04/2015  CLINICAL DATA:  Shortness of Breath EXAM: CHEST 1 VIEW COMPARISON:  Jun 03, 2015 FINDINGS: There is no edema or consolidation. The heart size and pulmonary vascularity are normal. No adenopathy. No bone lesions. IMPRESSION: No edema or consolidation. Electronically Signed   By: Lowella Grip III M.D.   On: 06/04/2015 08:06   Dg Chest 1 View  06/03/2015  CLINICAL DATA:  History of COPD, CHF, chronic shortness of breath with increased symptoms today. EXAM: CHEST 1 VIEW COMPARISON:  Portable chest x-ray of Jun 01, 2015 FINDINGS: The lungs remain hyperinflated but are clear. There is no pneumothorax, pneumomediastinum, or pleural effusion. The heart and pulmonary vascularity are normal. The trachea is midline. The bony thorax exhibits no acute abnormality. IMPRESSION: COPD.  There is no active cardiopulmonary disease. Electronically Signed   By: David  Martinique M.D.   On: 06/03/2015 09:04       --Marda Stalker, MD.  ICU Pager: [phone]  Pulmonary and Critical Care Office Number: [phone]  Patricia Pesa, M.D.  Vilinda Boehringer, M.D.  Merton Border, M.D  06/04/2015   Critical Care Attestation.  I have personally obtained a history, examined the patient, evaluated laboratory and imaging results, formulated the assessment and plan and placed orders. The Patient requires high complexity decision making for assessment and support, frequent evaluation and titration of therapies, application of advanced monitoring technologies and extensive interpretation of multiple databases. The patient has critical illness that could lead imminently to failure of 1 or more organ systems and requires the highest level of physician preparedness to intervene.  Critical Care Time devoted to patient care services described in this note is 35 minutes and is exclusive of time  spent in procedures.

## 2015-06-04 NOTE — Progress Notes (Signed)
Pharmacy Antibiotic Note  Dawn Donaldson is a 65 y.o. female admitted on 06/01/2015 with respiratory failure, COPD, r/o pneumonia.  Pharmacy has been consulted for Zosyn dosing. Patient is also on azithromycin.   Plan: Zosyn 3.375g IV q8h (4 hour infusion).  Height: 5\' 2"  (157.5 cm) Weight: 211 lb 3.2 oz (95.8 kg) IBW/kg (Calculated) : 50.1  Temp (24hrs), Avg:97.9 F (36.6 C), Min:97.7 F (36.5 C), Max:98.3 F (36.8 C)   Recent Labs Lab 06/01/15 1051 06/01/15 1149 06/01/15 1755 06/04/15 0325  WBC 18.6*  --   --  16.3*  CREATININE 0.78  --   --  0.97  LATICACIDVEN  --  0.8 1.6  --     Estimated Creatinine Clearance: 64.1 mL/min (by C-G formula based on Cr of 0.97).    Allergies  Allergen Reactions  . Dexlansoprazole Nausea And Vomiting  . Ultram [Tramadol] Itching    Antimicrobials this admission: azithromycin 5/30 >>  Zosyn 5/30 >>   Dose adjustments this admission:   Microbiology results: 5/28 BCx: NGTD x 2  MRSA PCR: pending  Thank you for allowing pharmacy to be a part of this patient's care.  Shephanie Romas D 06/04/2015 11:32 AM

## 2015-06-04 NOTE — Plan of Care (Signed)
Problem: Activity: Goal: Risk for activity intolerance will decrease Outcome: Not Progressing Pt still becomes very short of breath with any exertion.

## 2015-06-04 NOTE — Progress Notes (Signed)
Inpatient Diabetes Program Recommendations  AACE/ADA: New Consensus Statement on Inpatient Glycemic Control (2015)  Target Ranges:  Prepandial:   less than 140 mg/dL      Peak postprandial:   less than 180 mg/dL (1-2 hours)      Critically ill patients:  140 - 180 mg/dL  Results for Dawn Donaldson, Dawn Donaldson (MRN [id number]) as of 06/04/2015 09:44  Ref. Range 06/01/2015 10:51 06/04/2015 03:25  Glucose Latest Ref Range: 65-99 mg/dL 108 (H) 143 (H)   Review of Glycemic Control  Diabetes history: No Outpatient Diabetes medications: NA Current orders for Inpatient glycemic control: Novolog 2-6 units TID with meals  Inpatient Diabetes Program Recommendations: HgbA1C: According to the chart patient does not have a documented history of diabetes. Patient is ordered Solumedrol 60 mg Q4H which is likely cause of hyperglycemia.  Please consider ordering an A1C to evaluate glycemic control over the past 2-3 months.  Thanks, Barnie Alderman, RN, MSN, CDE Diabetes Coordinator Inpatient Diabetes Program [phone] (Team Pager from [place] to [place]) [phone] ([place] office) [phone] [facility] office) [phone] [facility] office)

## 2015-06-05 LAB — CBC
HCT: 39.9 % (ref 35.0–47.0)
Hemoglobin: 13 g/dL (ref 12.0–16.0)
MCH: 30.9 pg (ref 26.0–34.0)
MCHC: 32.5 g/dL (ref 32.0–36.0)
MCV: 95.2 fL (ref 80.0–100.0)
PLATELETS: 223 10*3/uL (ref 150–440)
RBC: 4.19 MIL/uL (ref 3.80–5.20)
RDW: 14 % (ref 11.5–14.5)
WBC: 15 10*3/uL — AB (ref 3.6–11.0)

## 2015-06-05 LAB — COMPREHENSIVE METABOLIC PANEL
ALT: 12 U/L — ABNORMAL LOW (ref 14–54)
AST: 13 U/L — AB (ref 15–41)
Albumin: 3.4 g/dL — ABNORMAL LOW (ref 3.5–5.0)
Alkaline Phosphatase: 58 U/L (ref 38–126)
Anion gap: 7 (ref 5–15)
BUN: 24 mg/dL — ABNORMAL HIGH (ref 6–20)
CHLORIDE: 100 mmol/L — AB (ref 101–111)
CO2: 32 mmol/L (ref 22–32)
Calcium: 8.8 mg/dL — ABNORMAL LOW (ref 8.9–10.3)
Creatinine, Ser: 0.92 mg/dL (ref 0.44–1.00)
Glucose, Bld: 138 mg/dL — ABNORMAL HIGH (ref 65–99)
POTASSIUM: 3.8 mmol/L (ref 3.5–5.1)
SODIUM: 139 mmol/L (ref 135–145)
Total Bilirubin: 0.3 mg/dL (ref 0.3–1.2)
Total Protein: 5.8 g/dL — ABNORMAL LOW (ref 6.5–8.1)

## 2015-06-05 LAB — GLUCOSE, CAPILLARY
GLUCOSE-CAPILLARY: 101 mg/dL — AB (ref 65–99)
GLUCOSE-CAPILLARY: 120 mg/dL — AB (ref 65–99)
GLUCOSE-CAPILLARY: 135 mg/dL — AB (ref 65–99)
GLUCOSE-CAPILLARY: 143 mg/dL — AB (ref 65–99)
GLUCOSE-CAPILLARY: 159 mg/dL — AB (ref 65–99)
GLUCOSE-CAPILLARY: 175 mg/dL — AB (ref 65–99)

## 2015-06-05 LAB — MYCOPLASMA PNEUMONIAE ANTIBODY, IGM

## 2015-06-05 MED ORDER — METHYLPREDNISOLONE SODIUM SUCC 125 MG IJ SOLR
60.0000 mg | Freq: Three times a day (TID) | INTRAMUSCULAR | Status: DC
  Administered 2015-06-05 – 2015-06-07 (×5): 60 mg via INTRAVENOUS
  Filled 2015-06-05 (×5): qty 2

## 2015-06-05 MED ORDER — DOCUSATE SODIUM 100 MG PO CAPS
100.0000 mg | ORAL_CAPSULE | Freq: Two times a day (BID) | ORAL | Status: DC
  Administered 2015-06-05 – 2015-06-06 (×3): 100 mg via ORAL
  Filled 2015-06-05 (×5): qty 1

## 2015-06-05 NOTE — Progress Notes (Signed)
Pt transferred to room 115, report given to [place], rn receiving pt.  Pt is alert and oriented, back pain relieved with oxycodone.  NSR, expiratory and inspiratory wheezing on auscultation. 3L [place].  Tolerating diet.  Up to [facility], urine output adequate.  VSS, afebrile.

## 2015-06-05 NOTE — Progress Notes (Addendum)
[facility] Progess Note    ASSESSMENT/PLAN   65 year old female with severe end-stage emphysema, who sees Dr. Raul Del as her outpatient pulmonologist, now with severe exacerbation of COPD  PULMONARY A:-Acute exacerbation of COPD, with acute bronchitis. -Review of the chest x-ray shows chronic changes of hyperinflation/COPD. -Chronic respiratory failure on home oxygen with severe baseline emphysema, baseline FEV1 is 28% of predicted. -Patient notes her breathing is better today, she has been weaned off BiPAP.  P:   -Monitor off BiPAP today. -Though I do not see definitive evidence of pneumonia on her chest x-ray, agree with continued broad-spectrum antibiotics for empiric treatment of pneumonia. Given her severity. --Continue high-dose IV steroids, though will decrease the dose slightly, continue nebulizers, Dulera. -Continue PPI. -Continue Daliresp, theophylline, pseudoephedrine. -Patient has wheezing which appears to be greater on the right side, concerning for endobronchial obstruction. We'll consider outpatient bronchoscopy, once the patient has recovered from his exacerbation. My office will arrange outpt bronch, pt will continue to follow with Dr. Raul Del for pulmonary issues.   CARDIOVASCULAR A: -- P:  --  RENAL A:  --  GASTROINTESTINAL A:  On Protonix.  HEMATOLOGIC A:  Leukocytosis, likely secondary to steroids. P:  --  INFECTIOUS A:  -- P:    Micro/culture results:  MRSA PCR 5/30: Negative BCx2 5/28: Pending UC .-- Sputum--  Antibiotics: Azithromycin 5/30>> Zosyn 5/30>>  ENDOCRINE A:  Continue monitoring of blood glucose levels. P:   Accu-Cheks with sliding scale insulin before meals and at bedtime.  NEUROLOGIC A:  --   MAJOR EVENTS/TEST RESULTS:   Best Practices  DVT Prophylaxis: Enoxaparin GI Prophylaxis: Oral  diet.   ---------------------------------------   ----------------------------------------   Name: Dawn Donaldson MRN: [id number] DOB: 13-Jun-1950    ADMISSION DATE:  06/01/2015   SUBJECTIVE:   Patient is feeling and looking better today.  Review of Systems:  Constitutional: Feels well. Cardiovascular: No chest pain.  Pulmonary: Denies cough The remainder of systems were reviewed and were found to be negative other than what is documented in the HPI.    VITAL SIGNS: Temp:  [97.6 F (36.4 C)-98.7 F (37.1 C)] 98.7 F (37.1 C) (06/01 0800) Pulse Rate:  [65-86] 69 (06/01 0800) Resp:  [11-28] 14 (06/01 0800) BP: (109-165)/(54-119) 134/119 mmHg (06/01 0821) SpO2:  [90 %-100 %] 97 % (06/01 0801) FiO2 (%):  [30 %] 30 % (06/01 0005) HEMODYNAMICS:   VENTILATOR SETTINGS: Vent Mode:  [-]  FiO2 (%):  [30 %] 30 % INTAKE / OUTPUT:  Intake/Output Summary (Last 24 hours) at 06/05/15 0921 Last data filed at 06/05/15 0519  Gross per 24 hour  Intake    400 ml  Output   2325 ml  Net  -1925 ml    PHYSICAL EXAMINATION: Physical Examination:   VS: BP 134/119 mmHg  Pulse 69  Temp(Src) 98.7 F (37.1 C) (Oral)  Resp 14  Ht 5\' 2"  (1.575 m)  Wt 211 lb 3.2 oz (95.8 kg)  BMI 38.62 kg/m2  SpO2 97%  General Appearance: No distress  Neuro:without focal findings, mental status normal. HEENT: PERRLA, EOM intact. Pulmonary: Diffuse bilateral wheezing,Improved since yesterday right worse than left. CardiovascularNormal S1,S2.  No m/r/g.   Abdomen: Benign, Soft, non-tender. Renal:  No costovertebral tenderness  GU:  Not performed at this time. Endocrine: No evident thyromegaly. Skin:   warm, no rashes, no ecchymosis  Extremities: normal, no cyanosis, clubbing.   LABS:   LABORATORY PANEL:   CBC  Recent Labs Lab 06/05/15  0404  WBC 15.0*  HGB 13.0  HCT 39.9  PLT 223    Chemistries   Recent Labs Lab 06/05/15 0404  NA 139  K 3.8  CL 100*  CO2 32  GLUCOSE 138*   BUN 24*  CREATININE 0.92  CALCIUM 8.8*  AST 13*  ALT 12*  ALKPHOS 58  BILITOT 0.3     Recent Labs Lab 06/04/15 1125 06/04/15 1633 06/04/15 1956 06/05/15 0043 06/05/15 0413 06/05/15 0752  GLUCAP 140* 130* 143* 159* 135* 120*    Recent Labs Lab 06/03/15 0842 06/04/15 0400  PHART 7.35 7.41  PCO2ART 59* 55*  PO2ART 33* 65*    Recent Labs Lab 06/01/15 1051 06/05/15 0404  AST 18 13*  ALT 14 12*  ALKPHOS 94 58  BILITOT 0.4 0.3  ALBUMIN 4.2 3.4*    Cardiac Enzymes  Recent Labs Lab 06/02/15 1528  TROPONINI <0.03    RADIOLOGY:  Dg Chest 1 View  06/04/2015  CLINICAL DATA:  Shortness of Breath EXAM: CHEST 1 VIEW COMPARISON:  Jun 03, 2015 FINDINGS: There is no edema or consolidation. The heart size and pulmonary vascularity are normal. No adenopathy. No bone lesions. IMPRESSION: No edema or consolidation. Electronically Signed   By: Lowella Grip III M.D.   On: 06/04/2015 08:06       --Marda Stalker, MD.  ICU Pager: [phone] [place] Office Number: [phone]  Patricia Pesa, M.D.  Vilinda Boehringer, M.D.  Merton Border, M.D  06/05/2015

## 2015-06-05 NOTE — Progress Notes (Signed)
Inpatient Diabetes Program Recommendations  AACE/ADA: New Consensus Statement on Inpatient Glycemic Control (2015)  Target Ranges:  Prepandial:   less than 140 mg/dL      Peak postprandial:   less than 180 mg/dL (1-2 hours)      Critically ill patients:  140 - 180 mg/dL  Results for Dawn Donaldson, Dawn Donaldson (MRN [id number]) as of 06/05/2015 11:40  Ref. Range 06/04/2015 11:25 06/04/2015 16:33 06/04/2015 19:56 06/05/2015 00:43 06/05/2015 04:13 06/05/2015 07:52 06/05/2015 11:29  Glucose-Capillary Latest Ref Range: 65-99 mg/dL 140 (H) 130 (H) 143 (H) 159 (H) 135 (H) 120 (H) 143 (H)   Review of Glycemic Control   Current orders for Inpatient glycemic control: ICU Glycemic Control Phase 1 order set (Novolog 2-6 units TID with meals)  Inpatient Diabetes Program Recommendations: Correction (SSI): Patient is currently ordered ICU Glycemic Control Phase 1 order set. Patient is not appropriate for ICU protocol. Please consider discontinuing ICU Glycemic control order set and ordering regular Glycemic Control order set with CBGs and Novolog 0-9 units ACHS. HgbA1C: According to the chart patient does not have a documented history of diabetes. Patient is ordered Solumedrol 60 mg Q6H which is likely cause of hyperglycemia.  Please consider ordering an A1C to evaluate glycemic control over the past 2-3 months.  Thanks, Barnie Alderman, RN, MSN, CDE Diabetes Coordinator Inpatient Diabetes Program [phone] (Team Pager from [place] to [place]) [phone] ([place] office) [phone] [facility] office) [phone] [facility] office)

## 2015-06-05 NOTE — Care Management (Addendum)
Patient is readmission and has had 5 admissions within the last 6 months.  It is documented on 2 previous discharge patient has declined home health.  Patient does have chronic home 02 and lives with her significant other - Kasandra Knudsen- otherwise known as "Dr Linna Hoff."  Patient feels that she is not in need of home health nurse because Linna Hoff "does everything."  Discussed that Linna Hoff (and Linna Hoff agrees) that is not a [employer]er, [employer] and despite his efforts, patient continues with readmission trend.  Discussed [facility] and [facility] program and the benefits of decreasing readmission with earlier identification and reporting of sx and more timely medical interventions.  Verbalizes agreement to "try" [facility] nursing [place].   PA approval

## 2015-06-05 NOTE — Progress Notes (Signed)
[place] [facility] [place] NAME: Dawn Donaldson    MR#:  [id number]  DATE OF BIRTH:  1950/08/20  SUBJECTIVE;feeling better than yesterday.transferred out of ICU.  CHIEF COMPLAINT:   Chief Complaint  Patient presents with  . Respiratory Distress  . Chest Pain    REVIEW OF SYSTEMS:   ROS CONSTITUTIONAL: No fever, fatigue or weakness.  EYES: No blurred or double vision.  EARS, NOSE, AND THROAT: No tinnitus or ear pain.  RESPIRATORY: , shortness of breath, wheezing , better CARDIOVASCULAR: No chest pain, orthopnea, edema.  GASTROINTESTINAL: No nausea, vomiting, diarrhea or abdominal pain.  GENITOURINARY: No dysuria, hematuria.  ENDOCRINE: No polyuria, nocturia,  HEMATOLOGY: No anemia, easy bruising or bleeding SKIN: No rash or lesion. MUSCULOSKELETAL: No joint pain or arthritis.   NEUROLOGIC: No tingling, numbness, weakness.  PSYCHIATRY: No anxiety or depression.   DRUG ALLERGIES:   Allergies  Allergen Reactions  . Dexlansoprazole Nausea And Vomiting  . Ultram [Tramadol] Itching    VITALS:  Blood pressure 126/58, pulse 74, temperature 98.1 F (36.7 C), temperature source Oral, resp. rate 18, height 5\' 3"  (1.6 m), weight 95.391 kg (210 lb 4.8 oz), SpO2 99 %.  PHYSICAL EXAMINATION:  GENERAL:  65 y.o.-year-old patient lying in the bed with no acute distress. EYES: Pupils equal, round, reactive to light and accommodation. No scleral icterus. Extraocular muscles intact.  HEENT: Head atraumatic, normocephalic. Oropharynx and nasopharynx clear.  NECK:  Supple, no jugular venous distention. No thyroid enlargement, no tenderness.  LUNGS;Bilateral Expiratory wheezing in all lung fields., no  rales,rhonchi or crepitation. No use of accessory muscles of respiration.  CARDIOVASCULAR: S1, S2 normal. No murmurs, rubs, or gallops.  ABDOMEN: Soft, nontender, nondistended. Bowel sounds present. No organomegaly or mass.  EXTREMITIES: No pedal edema,  cyanosis, or clubbing.  NEUROLOGIC: Cranial nerves II through XII are intact. Muscle strength 5/5 in all extremities. Sensation intact. Gait not checked.  PSYCHIATRIC: The patient is alert and oriented x 3.  SKIN: No obvious rash, lesion, or ulcer.    LABORATORY PANEL:   CBC  Recent Labs Lab 06/05/15 0404  WBC 15.0*  HGB 13.0  HCT 39.9  PLT 223   ------------------------------------------------------------------------------------------------------------------  Chemistries   Recent Labs Lab 06/05/15 0404  NA 139  K 3.8  CL 100*  CO2 32  GLUCOSE 138*  BUN 24*  CREATININE 0.92  CALCIUM 8.8*  AST 13*  ALT 12*  ALKPHOS 58  BILITOT 0.3   ------------------------------------------------------------------------------------------------------------------  Cardiac Enzymes  Recent Labs Lab 06/02/15 1528  TROPONINI <0.03   ------------------------------------------------------------------------------------------------------------------  RADIOLOGY:  Dg Chest 1 View  06/04/2015  CLINICAL DATA:  Shortness of Breath EXAM: CHEST 1 VIEW COMPARISON:  Jun 03, 2015 FINDINGS: There is no edema or consolidation. The heart size and pulmonary vascularity are normal. No adenopathy. No bone lesions. IMPRESSION: No edema or consolidation. Electronically Signed   By: Lowella Grip III M.D.   On: 06/04/2015 08:06    EKG:   Orders placed or performed during the hospital encounter of 06/01/15  . EKG 12-Lead  . EKG 12-Lead  . EKG 12-Lead  . EKG 12-Lead    ASSESSMENT AND PLAN:  #1 acute on chronic respiratory failure with severe COPD exacerbation: hypercapniec : ABG  Showing co2 elevation.  Needs out pt bronchoscopy as per pulmonary  Note ,wheezing more on right side. Will or evaluation.der CT chest today to evaluate further. transferred to icu yesterday for r worsening respiratory status.bipap at night.,continue  IV steroids #2. chronic respiratory failure, severe emphysema:  Patient is on now Dulera, theophylline, DAliresp .continue empiric abx. 3.chest pain; controlled due to respiratory distress.;improved.  4. History of CAD: Patient is on statins, nitrates, Plavix. #5 chronic nausea patient takes ondansetron with relief. Continue ondansetron. The patient, patient's  Husband. Also discussed with patient's nurse.  All the records are reviewed and case discussed with Care Management/Social Workerr. Management plans discussed with the patient, family and they are in agreement.  CODE STATUS: full  TOTAL TIME TAKING CARE OF THIS PATIENT: 35 min.   POSSIBLE D/C IN 1-2  DAYS, DEPENDING ON CLINICAL CONDITION.   Epifanio Lesches M.D on 06/05/2015 at 6:14 PM  Between 7am to 6pm - Pager - [phone]  After 6pm go to [email or web address] - password [email or web address] [facility]  Office  [phone]  CC: Primary care physician; Lorelee Market, MD   Note: This dictation was prepared with Dragon dictation along with smaller phrase technology. Any transcriptional errors that result from this process are unintentional.

## 2015-06-06 ENCOUNTER — Encounter: Payer: Self-pay | Admitting: Radiology

## 2015-06-06 ENCOUNTER — Inpatient Hospital Stay: Payer: Medicare Other

## 2015-06-06 LAB — CULTURE, BLOOD (ROUTINE X 2)
CULTURE: NO GROWTH
Culture: NO GROWTH

## 2015-06-06 LAB — BASIC METABOLIC PANEL
Anion gap: 10 (ref 5–15)
BUN: 23 mg/dL — AB (ref 6–20)
CHLORIDE: 96 mmol/L — AB (ref 101–111)
CO2: 34 mmol/L — ABNORMAL HIGH (ref 22–32)
CREATININE: 0.96 mg/dL (ref 0.44–1.00)
Calcium: 8.7 mg/dL — ABNORMAL LOW (ref 8.9–10.3)
GFR calc Af Amer: >60 mL/min (ref >60)
Glucose, Bld: 140 mg/dL — ABNORMAL HIGH (ref 65–99)
Potassium: 3.8 mmol/L (ref 3.5–5.1)
SODIUM: 140 mmol/L (ref 135–145)

## 2015-06-06 LAB — GLUCOSE, CAPILLARY
GLUCOSE-CAPILLARY: 178 mg/dL — AB (ref 65–99)
Glucose-Capillary: 102 mg/dL — ABNORMAL HIGH (ref 65–99)
Glucose-Capillary: 112 mg/dL — ABNORMAL HIGH (ref 65–99)

## 2015-06-06 LAB — CBC
HCT: 40.6 % (ref 35.0–47.0)
Hemoglobin: 13.2 g/dL (ref 12.0–16.0)
MCH: 31.5 pg (ref 26.0–34.0)
MCHC: 32.4 g/dL (ref 32.0–36.0)
MCV: 97.2 fL (ref 80.0–100.0)
PLATELETS: 238 10*3/uL (ref 150–440)
RBC: 4.18 MIL/uL (ref 3.80–5.20)
RDW: 13.9 % (ref 11.5–14.5)
WBC: 13.4 10*3/uL — ABNORMAL HIGH (ref 3.6–11.0)

## 2015-06-06 LAB — STREP PNEUMONIAE URINARY ANTIGEN: Strep Pneumo Urinary Antigen: NEGATIVE

## 2015-06-06 LAB — HEMOGLOBIN A1C: HEMOGLOBIN A1C: 5.7 % (ref 4.0–6.0)

## 2015-06-06 MED ORDER — AZITHROMYCIN 250 MG PO TABS
500.0000 mg | ORAL_TABLET | Freq: Every day | ORAL | Status: AC
  Administered 2015-06-07: 500 mg via ORAL
  Filled 2015-06-06: qty 2

## 2015-06-06 MED ORDER — IOPAMIDOL (ISOVUE-300) INJECTION 61%
75.0000 mL | Freq: Once | INTRAVENOUS | Status: AC | PRN
  Administered 2015-06-06: 75 mL via INTRAVENOUS

## 2015-06-06 NOTE — Progress Notes (Signed)
[facility] [place] NAME: Dawn Donaldson    MR#:  [id number]  DATE OF BIRTH:  09/10/1950  SUBJECTIVE; admitted for severe COPD exacerbation. Transferred to ICU because of worsening respiratory status. Was on BiPAP and now she is back to the floor. Breathing is improving.   CHIEF COMPLAINT:   Chief Complaint  Patient presents with  . Respiratory Distress  . Chest Pain    REVIEW OF SYSTEMS:   ROS CONSTITUTIONAL: No fever, fatigue or weakness.  EYES: No blurred or double vision.  EARS, NOSE, AND THROAT: No tinnitus or ear pain.  RESPIRATORY: , shortness of breath, wheezing , better CARDIOVASCULAR: No chest pain, orthopnea, edema.  GASTROINTESTINAL: No nausea, vomiting, diarrhea or abdominal pain.  GENITOURINARY: No dysuria, hematuria.  ENDOCRINE: No polyuria, nocturia,  HEMATOLOGY: No anemia, easy bruising or bleeding SKIN: No rash or lesion. MUSCULOSKELETAL: No joint pain or arthritis.   NEUROLOGIC: No tingling, numbness, weakness.  PSYCHIATRY: No anxiety or depression.   DRUG ALLERGIES:   Allergies  Allergen Reactions  . Dexlansoprazole Nausea And Vomiting  . Ultram [Tramadol] Itching    VITALS:  Blood pressure 118/55, pulse 66, temperature 98.1 F (36.7 C), temperature source Oral, resp. rate 18, height 5\' 3"  (1.6 m), weight 95.391 kg (210 lb 4.8 oz), SpO2 100 %.  PHYSICAL EXAMINATION:  GENERAL:  65 y.o.-year-old patient lying in the bed with no acute distress. EYES: Pupils equal, round, reactive to light and accommodation. No scleral icterus. Extraocular muscles intact.  HEENT: Head atraumatic, normocephalic. Oropharynx and nasopharynx clear.  NECK:  Supple, no jugular venous distention. No thyroid enlargement, no tenderness.  LUNGS;Bilateral Expiratory wheezing in all lung fields., no  rales,rhonchi or crepitation. No use of accessory muscles of respiration.  CARDIOVASCULAR: S1, S2 normal. No murmurs, rubs, or gallops.   ABDOMEN: Soft, nontender, nondistended. Bowel sounds present. No organomegaly or mass.  EXTREMITIES: No pedal edema, cyanosis, or clubbing.  NEUROLOGIC: Cranial nerves II through XII are intact. Muscle strength 5/5 in all extremities. Sensation intact. Gait not checked.  PSYCHIATRIC: The patient is alert and oriented x 3.  SKIN: No obvious rash, lesion, or ulcer.    LABORATORY PANEL:   CBC  Recent Labs Lab 06/06/15 0435  WBC 13.4*  HGB 13.2  HCT 40.6  PLT 238   ------------------------------------------------------------------------------------------------------------------  Chemistries   Recent Labs Lab 06/05/15 0404 06/06/15 0435  NA 139 140  K 3.8 3.8  CL 100* 96*  CO2 32 34*  GLUCOSE 138* 140*  BUN 24* 23*  CREATININE 0.92 0.96  CALCIUM 8.8* 8.7*  AST 13*  --   ALT 12*  --   ALKPHOS 58  --   BILITOT 0.3  --    ------------------------------------------------------------------------------------------------------------------  Cardiac Enzymes  Recent Labs Lab 06/02/15 1528  TROPONINI <0.03   ------------------------------------------------------------------------------------------------------------------  RADIOLOGY:  No results found.  EKG:   Orders placed or performed during the hospital encounter of 06/01/15  . EKG 12-Lead  . EKG 12-Lead  . EKG 12-Lead  . EKG 12-Lead    ASSESSMENT AND PLAN:  #1 acute on chronic respiratory failure with severe COPD exacerbation: hypercapniec : ABG  Showing co2 elevation. Improving slowly.. Continue empiric antibiotics, IV steroids, nebulizers same dose today.  Needs out pt bronchoscopy as per pulmonary  Note  There is improving. Continue empiric antibiotics, IV steroids, nebulizers same dose today.  #2. chronic respiratory failure, severe emphysema: Patient is on now Dulera, theophylline, DAliresp .continue empiric abx. 3.chest  pain; improved 4. History of CAD: Patient is on statins, nitrates, Plavix. #5  chronic nausea patient takes ondansetron with relief. Continue ondansetron.  The patient, patient's  Husband. Also discussed with patient's nurse.  All the records are reviewed and case discussed with Care Management/Social Workerr. Management plans discussed with the patient, family and they are in agreement.  CODE STATUS: full  TOTAL TIME TAKING CARE OF THIS PATIENT: 35 min.   POSSIBLE D/C IN 1-2  DAYS, DEPENDING ON CLINICAL CONDITION.   Epifanio Lesches M.D on 06/06/2015 at 10:22 AM  Between 7am to 6pm - Pager - [phone]  After 6pm go to [email or web address] - password [email or web address] [facility]  Office  [phone]  CC: Primary care physician; Lorelee Market, MD   Note: This dictation was prepared with Dragon dictation along with smaller phrase technology. Any transcriptional errors that result from this process are unintentional.

## 2015-06-06 NOTE — Care Management Important Message (Signed)
Important Message  Patient Details  Name: Dawn Donaldson MRN: [id number] Date of Birth: 1950/10/19   Medicare Important Message Given:  Yes    Shelbie Ammons, RN 06/06/2015, 11:15 AM

## 2015-06-06 NOTE — Progress Notes (Signed)
Offered to place pt on Bipap for sleep but pt refused. Pt states that she will call when she is ready to go on Bipap. Pt states that she may not wear the Bipap tonight.

## 2015-06-06 NOTE — Progress Notes (Signed)
Pharmacy Antibiotic Note-[place] is a 65 y.o. female admitted on 06/01/2015 with respiratory failure, COPD, r/o pneumonia.  Pharmacy has been consulted for Zosyn dosing. Patient is also on azithromycin.   Plan: Per MD on rounds, continue Zosyn 3.375 gm IV q8h per EI protocol for 5 days total.  Stop date on 6/3.    Height: 5\' 3"  (160 cm) Weight: 210 lb 4.8 oz (95.391 kg) IBW/kg (Calculated) : 52.4  Temp (24hrs), Avg:98.2 F (36.8 C), Min:98 F (36.7 C), Max:98.5 F (36.9 C)   Recent Labs Lab 06/01/15 1051 06/01/15 1149 06/01/15 1755 06/04/15 0325 06/05/15 0404 06/06/15 0435  WBC 18.6*  --   --  16.3* 15.0* 13.4*  CREATININE 0.78  --   --  0.97 0.92 0.96  LATICACIDVEN  --  0.8 1.6  --   --   --     Estimated Creatinine Clearance: 65.9 mL/min (by C-G formula based on Cr of 0.96).    Allergies  Allergen Reactions  . Dexlansoprazole Nausea And Vomiting  . Ultram [Tramadol] Itching    Antimicrobials this admission: azithromycin 5/30 >>  Zosyn 5/30 >>   Dose adjustments this admission:   Microbiology results: 5/28 BCx: NGTD x 2  MRSA PCR: negative  Thank you for allowing pharmacy to be a part of this patient's care.  Rhonin Trott G 06/06/2015 9:11 AM

## 2015-06-07 LAB — GLUCOSE, CAPILLARY
GLUCOSE-CAPILLARY: 156 mg/dL — AB (ref 65–99)
Glucose-Capillary: 118 mg/dL — ABNORMAL HIGH (ref 65–99)
Glucose-Capillary: 138 mg/dL — ABNORMAL HIGH (ref 65–99)
Glucose-Capillary: 116 mg/dL — ABNORMAL HIGH (ref 65–99)

## 2015-06-07 MED ORDER — METHYLPREDNISOLONE SODIUM SUCC 125 MG IJ SOLR
60.0000 mg | Freq: Every day | INTRAMUSCULAR | Status: DC
  Administered 2015-06-08: 60 mg via INTRAVENOUS
  Filled 2015-06-07: qty 2

## 2015-06-07 NOTE — Progress Notes (Signed)
Pulmonary Critical Care  Follow up Consult Note   Willine Spragg [id number] DOB: 27-Feb-1950 DOA: 06/01/2015  PCP: Lorelee Market, MD    HPI: Dawn Donaldson is a 65 y.o. female seen for follow up. Patient has been doing better at this time. She has less SOB noted. No new events noted overnight.   Review of Systems:   ROS performed and is unremarkable other than noted above  Past Medical History  Diagnosis Date  . COPD (chronic obstructive pulmonary disease) ([place])     on nocturnal home o2  . Hypertension   . CAD (coronary artery disease)     s/p PCI  . Diastolic CHF ([place])   . Hyperlipidemia   . GERD (gastroesophageal reflux disease)   . Osteoporosis   . Chronic low back pain   . CVA (cerebral infarction)   . DJD (degenerative joint disease)   . Colon polyps    Past Surgical History  Procedure Laterality Date  . Carotid endarterectomy      Right  . Cholecystectomy    . Appendectomy    . Back surgery    . Partial hysterectomy     Social History:  reports that she has quit smoking. Her smoking use included Cigarettes. She smoked 0.10 packs per day. She has never used smokeless tobacco. She reports that she does not drink alcohol or use illicit drugs.  Allergies  Allergen Reactions  . Dexlansoprazole Nausea And Vomiting  . Ultram [Tramadol] Itching    Family History  Problem Relation Age of Onset  . Atrial fibrillation Mother   . CAD Father     Prior to Admission medications   Medication Sig Start Date End Date Taking? Authorizing Provider  acetaminophen (TYLENOL) 500 MG tablet Take 1,000 mg by mouth every 6 (six) hours as needed for mild pain or headache.   Yes Historical Provider, MD  albuterol (PROVENTIL HFA;VENTOLIN HFA) 108 (90 BASE) MCG/ACT inhaler Inhale 2 puffs into the lungs every 6 (six) hours as needed for wheezing or shortness of breath.    Yes Historical Provider, MD  COMBIVENT RESPIMAT 20-100 MCG/ACT AERS respimat Inhale 1 puff into the lungs  4 (four) times daily. 05/27/14  Yes Gladstone Lighter, MD  DALIRESP 500 MCG TABS tablet Take 500 mcg by mouth daily. 05/08/15  Yes Historical Provider, MD  Fluticasone-Salmeterol (ADVAIR) 250-50 MCG/DOSE AEPB Inhale 1 puff into the lungs 2 (two) times daily.   Yes Historical Provider, MD  furosemide (LASIX) 40 MG tablet Take 40 mg by mouth daily as needed for fluid or edema.    Yes Historical Provider, MD  guaiFENesin-codeine 100-10 MG/5ML syrup Take 10 mLs by mouth every 6 (six) hours as needed for cough. 05/02/15  Yes Dustin Flock, MD  hydrochlorothiazide (HYDRODIURIL) 12.5 MG tablet Take 12.5 mg by mouth every evening.   Yes Historical Provider, MD  ipratropium-albuterol (DUONEB) 0.5-2.5 (3) MG/3ML SOLN Take 3 mLs by nebulization every 4 (four) hours as needed (for wheezing and/or shortness of breath).    Yes Historical Provider, MD  labetalol (NORMODYNE) 100 MG tablet Take 100 mg by mouth 2 (two) times daily. 05/08/15  Yes Historical Provider, MD  lisinopril (PRINIVIL,ZESTRIL) 20 MG tablet Take 20 mg by mouth every morning.    Yes Historical Provider, MD  nitroGLYCERIN (NITROSTAT) 0.4 MG SL tablet Place 0.4 mg under the tongue every 5 (five) minutes as needed for chest pain.    Yes Historical Provider, MD  Oxycodone HCl 10 MG TABS Take 1 tablet (  10 mg total) by mouth every 4 (four) hours as needed (for pain.). 02/28/15  Yes Bettey Costa, MD  theophylline (UNIPHYL) 400 MG 24 hr tablet Take 100 mg by mouth daily.    Yes Historical Provider, MD  clopidogrel (PLAVIX) 75 MG tablet Take 75 mg by mouth every evening.     Historical Provider, MD  esomeprazole (NEXIUM) 40 MG capsule Take 40 mg by mouth 2 (two) times daily.    Historical Provider, MD  guaiFENesin (MUCINEX) 600 MG 12 hr tablet Take 1 tablet (600 mg total) by mouth 2 (two) times daily. 05/02/15   Dustin Flock, MD  simvastatin (ZOCOR) 20 MG tablet Take 20 mg by mouth every evening.     Historical Provider, MD   Physical Exam: Filed Vitals:    06/07/15 0454 06/07/15 0735 06/07/15 0742 06/07/15 1135  BP: 132/61 123/104    Pulse: 62 77    Temp: 97.8 F (36.6 C)     TempSrc: Oral     Resp: 18 22    Height:      Weight:      SpO2: 96% 98% 96% 97%    Wt Readings from Last 3 Encounters:  06/05/15 95.391 kg (210 lb 4.8 oz)  05/02/15 94.53 kg (208 lb 6.4 oz)  03/27/15 95.227 kg (209 lb 15 oz)    General:  Appears calm and comfortable Eyes: PERRL, normal lids, irises & conjunctiva ENT: grossly normal hearing, lips & tongue Neck: no LAD, masses or thyromegaly Cardiovascular: RRR, no m/r/g. No LE edema. Respiratory: Normal respiratory effort. Abdomen: soft, nontender Skin: no rash or induration seen on limited exam Musculoskeletal: grossly normal tone BUE/BLE Psychiatric: grossly normal mood and affect Neurologic: grossly non-focal.          Labs on Admission:  Basic Metabolic Panel:  Recent Labs Lab 06/01/15 1051 06/04/15 0325 06/05/15 0404 06/06/15 0435  NA 141 137 139 140  K 4.0 4.2 3.8 3.8  CL 105 99* 100* 96*  CO2 30 32 32 34*  GLUCOSE 108* 143* 138* 140*  BUN 18 30* 24* 23*  CREATININE 0.78 0.97 0.92 0.96  CALCIUM 9.4 9.1 8.8* 8.7*   Liver Function Tests:  Recent Labs Lab 06/01/15 1051 06/05/15 0404  AST 18 13*  ALT 14 12*  ALKPHOS 94 58  BILITOT 0.4 0.3  PROT 7.4 5.8*  ALBUMIN 4.2 3.4*   No results for input(s): LIPASE, AMYLASE in the last 168 hours. No results for input(s): AMMONIA in the last 168 hours. CBC:  Recent Labs Lab 06/01/15 1051 06/04/15 0325 06/05/15 0404 06/06/15 0435  WBC 18.6* 16.3* 15.0* 13.4*  HGB 14.5 12.7 13.0 13.2  HCT 43.3 38.8 39.9 40.6  MCV 94.3 95.0 95.2 97.2  PLT 254 224 223 238   Cardiac Enzymes:  Recent Labs Lab 06/01/15 1051 06/02/15 1528  TROPONINI <0.03 <0.03    BNP (last 3 results)  Recent Labs  12/28/14 1407 03/09/15 1028 03/24/15 1254  BNP 61.0 69.0 58.0    ProBNP (last 3 results) No results for input(s): PROBNP in the last 8760  hours.  CBG:  Recent Labs Lab 06/06/15 0808 06/06/15 1142 06/06/15 1738 06/07/15 0757 06/07/15 1113  GLUCAP 102* 112* 178* 156* 118*    Radiological Exams on Admission: Ct Chest W Contrast  06/06/2015  CLINICAL DATA:  Acute on chronic respiratory failure with COPD exacerbation. Wheezing on the right side. EXAM: CT CHEST WITH CONTRAST TECHNIQUE: Multidetector CT imaging of the chest was performed during intravenous contrast administration.  CONTRAST:  22mL ISOVUE-300 IOPAMIDOL (ISOVUE-300) INJECTION 61% COMPARISON:  Chest radiograph 06/04/2015. Chest CT 07/24/2013 and 01/16/2013 FINDINGS: Mediastinum/Lymph Nodes: Normal caliber of the thoracic aorta without dissection. Small amount of atherosclerotic disease at the origin of the great vessels. Great vessels are patent. Mild atherosclerotic disease in the descending thoracic aorta. There are coronary artery calcifications. Main pulmonary arteries are patent. No suspicious chest lymphadenopathy. Normal appearance of the esophagus. Lungs/Pleura: Trachea is patent. Probable adherent mucus in the right mainstem bronchus extending into the bronchus intermedius. There is centrilobular emphysema in the upper lungs. Stable punctate nodular density near the right lung apex on sequence 3, image 27 is unchanged and likely benign. 3 mm nodule in the right upper lung on sequence 3, image 33 is stable from an exam on 01/16/2013. Stable pleural-based nodular density along the right major fissure on sequence 3, image 105. Linear densities along the posterior right lower lobe could represent atelectasis or mild scarring. Few pleural-based densities along the left lower lobe on sequence 3, image 96 are stable. There is a stable curvilinear density in the left upper lobe on sequence 3, image 31 which probably represents scarring. Upper abdomen: The gallbladder has been removed. Portal venous system is patent. Stable fullness of the left adrenal gland may represent  hyperplasia. No acute abnormality in the upper abdomen. Musculoskeletal: No acute bone abnormality. IMPRESSION: Suspect a small amount of mucus in the right airways as described. Otherwise, no acute chest findings. Centrilobular emphysema. Few punctate nodular densities in the lungs are unchanged since 01/16/2013. These findings are likely benign. Electronically Signed   By: Markus Daft M.D.   On: 06/06/2015 13:12    EKG: Independently reviewed.  Assessment/Plan Active Problems:   Acute on chronic respiratory failure ([place])   1. Acute on Chronic hypoxemic respiratory failure -patient appears to be clinically improving -continue with present therapy  2. COPD with exacerbation -she follows with Dr Raul Del as an outpatient -continue with present therapy dulera theo and daliresp     I have personally obtained a history, examined the patient, evaluated laboratory and imaging results, formulated the assessment and plan and placed orders.  The Patient requires high complexity decision making for assessment and support.    Allyne Gee, MD [facility] Pulmonary Critical Care Medicine Sleep Medicine

## 2015-06-07 NOTE — Progress Notes (Signed)
[facility] at [place] NAME: Dawn Donaldson    MRN#:  [id number]  DATE OF BIRTH:  May 28, 1950  SUBJECTIVE:  Hospital Day: 6 days Dawn Donaldson is a 66 y.o. female presenting with Respiratory Distress and Chest Pain .   Overnight events: No overnight events Interval Events: Still complains of shortness of breath with activity  REVIEW OF SYSTEMS:  CONSTITUTIONAL: No fever, fatigue or weakness.  EYES: No blurred or double vision.  EARS, NOSE, AND THROAT: No tinnitus or ear pain.  RESPIRATORY: No cough, Positive shortness of breath, wheezing denies hemoptysis.  CARDIOVASCULAR: No chest pain, orthopnea, edema.  GASTROINTESTINAL: No nausea, vomiting, diarrhea or abdominal pain.  GENITOURINARY: No dysuria, hematuria.  ENDOCRINE: No polyuria, nocturia,  HEMATOLOGY: No anemia, easy bruising or bleeding SKIN: No rash or lesion. MUSCULOSKELETAL: No joint pain or arthritis.   NEUROLOGIC: No tingling, numbness, weakness.  PSYCHIATRY: No anxiety or depression.   DRUG ALLERGIES:   Allergies  Allergen Reactions  . Dexlansoprazole Nausea And Vomiting  . Ultram [Tramadol] Itching    VITALS:  Blood pressure 123/104, pulse 77, temperature 97.8 F (36.6 C), temperature source Oral, resp. rate 22, height 5\' 3"  (1.6 m), weight 210 lb 4.8 oz (95.391 kg), SpO2 97 %.  PHYSICAL EXAMINATION:  VITAL SIGNS: Filed Vitals:   06/07/15 0454 06/07/15 0735  BP: 132/61 123/104  Pulse: 62 77  Temp: 97.8 F (36.6 C)   Resp: 18 29   GENERAL:65 y.o.female currently in no acute distress.  HEAD: Normocephalic, atraumatic.  EYES: Pupils equal, round, reactive to light. Extraocular muscles intact. No scleral icterus.  MOUTH: Moist mucosal membrane. Dentition intact. No abscess noted.  EAR, NOSE, THROAT: Clear without exudates. No external lesions.  NECK: Supple. No thyromegaly. No nodules. No JVD.  PULMONARY: Coarse breath sounds throughout with scattered  expiratory wheeze no rails or rhonci. No use of accessory muscles, Good respiratory effort. good air entry bilaterally CHEST: Nontender to palpation.  CARDIOVASCULAR: S1 and S2. Regular rate and rhythm. No murmurs, rubs, or gallops. No edema. Pedal pulses 2+ bilaterally.  GASTROINTESTINAL: Soft, nontender, nondistended. No masses. Positive bowel sounds. No hepatosplenomegaly.  MUSCULOSKELETAL: No swelling, clubbing, or edema. Range of motion full in all extremities.  NEUROLOGIC: Cranial nerves II through XII are intact. No gross focal neurological deficits. Sensation intact. Reflexes intact.  SKIN: No ulceration, lesions, rashes, or cyanosis. Skin warm and dry. Turgor intact.  PSYCHIATRIC: Mood, affect within normal limits. The patient is awake, alert and oriented x 3. Insight, judgment intact.      LABORATORY PANEL:   CBC  Recent Labs Lab 06/06/15 0435  WBC 13.4*  HGB 13.2  HCT 40.6  PLT 238   ------------------------------------------------------------------------------------------------------------------  Chemistries   Recent Labs Lab 06/05/15 0404 06/06/15 0435  NA 139 140  K 3.8 3.8  CL 100* 96*  CO2 32 34*  GLUCOSE 138* 140*  BUN 24* 23*  CREATININE 0.92 0.96  CALCIUM 8.8* 8.7*  AST 13*  --   ALT 12*  --   ALKPHOS 58  --   BILITOT 0.3  --    ------------------------------------------------------------------------------------------------------------------  Cardiac Enzymes  Recent Labs Lab 06/02/15 1528  TROPONINI <0.03   ------------------------------------------------------------------------------------------------------------------  RADIOLOGY:  Ct Chest W Contrast  06/06/2015  CLINICAL DATA:  Acute on chronic respiratory failure with COPD exacerbation. Wheezing on the right side. EXAM: CT CHEST WITH CONTRAST TECHNIQUE: Multidetector CT imaging of the chest was performed during intravenous contrast administration. CONTRAST:  33mL  ISOVUE-300 IOPAMIDOL  (ISOVUE-300) INJECTION 61% COMPARISON:  Chest radiograph 06/04/2015. Chest CT 07/24/2013 and 01/16/2013 FINDINGS: Mediastinum/Lymph Nodes: Normal caliber of the thoracic aorta without dissection. Small amount of atherosclerotic disease at the origin of the great vessels. Great vessels are patent. Mild atherosclerotic disease in the descending thoracic aorta. There are coronary artery calcifications. Main pulmonary arteries are patent. No suspicious chest lymphadenopathy. Normal appearance of the esophagus. Lungs/Pleura: Trachea is patent. Probable adherent mucus in the right mainstem bronchus extending into the bronchus intermedius. There is centrilobular emphysema in the upper lungs. Stable punctate nodular density near the right lung apex on sequence 3, image 27 is unchanged and likely benign. 3 mm nodule in the right upper lung on sequence 3, image 33 is stable from an exam on 01/16/2013. Stable pleural-based nodular density along the right major fissure on sequence 3, image 105. Linear densities along the posterior right lower lobe could represent atelectasis or mild scarring. Few pleural-based densities along the left lower lobe on sequence 3, image 96 are stable. There is a stable curvilinear density in the left upper lobe on sequence 3, image 31 which probably represents scarring. Upper abdomen: The gallbladder has been removed. Portal venous system is patent. Stable fullness of the left adrenal gland may represent hyperplasia. No acute abnormality in the upper abdomen. Musculoskeletal: No acute bone abnormality. IMPRESSION: Suspect a small amount of mucus in the right airways as described. Otherwise, no acute chest findings. Centrilobular emphysema. Few punctate nodular densities in the lungs are unchanged since 01/16/2013. These findings are likely benign. Electronically Signed   By: Markus Daft M.D.   On: 06/06/2015 13:12    EKG:   Orders placed or performed during the hospital encounter of 06/01/15   . EKG 12-Lead  . EKG 12-Lead  . EKG 12-Lead  . EKG 12-Lead    ASSESSMENT AND PLAN:   Dawn Donaldson is a 65 y.o. female presenting with Respiratory Distress and Chest Pain . Admitted 06/01/2015 : Day #: 6 days  1. Acute on chronic respiratory failure with hypercapnia: COPD exacerbation: Downgrade antibiotics and steroids, Needs out pt bronchoscopy as per pulmonary  2. chronic respiratory failure, severe emphysema: Patient is on now Dulera, theophylline, DAliresp . 3. History of CAD: Patient is on statins, nitrates, Plavix. 4 chronic nausea patient takes ondansetron with relief. Continue ondansetron.   All the records are reviewed and case discussed with Care Management/Social Workerr. Management plans discussed with the patient, family and they are in agreement.  CODE STATUS: full TOTAL TIME TAKING CARE OF THIS PATIENT: 28 minutes.   POSSIBLE D/C IN 1-2DAYS, DEPENDING ON CLINICAL CONDITION.   Hower,  Karenann Cai.D on 06/07/2015 at 12:23 PM  Between 7am to 6pm - Pager - [phone]  After 6pm: House Pager: - [facility]  Office  [phone]  CC: Primary care physician; Lorelee Market, MD

## 2015-06-08 LAB — CREATININE, SERUM: Creatinine, Ser: 0.91 mg/dL (ref 0.44–1.00)

## 2015-06-08 LAB — GLUCOSE, CAPILLARY: GLUCOSE-CAPILLARY: 84 mg/dL (ref 65–99)

## 2015-06-08 MED ORDER — PREDNISONE 10 MG (21) PO TBPK: 10.0000 mg | ORAL_TABLET | Freq: Every day | ORAL | Status: DC

## 2015-06-08 MED ORDER — THEOPHYLLINE ER 200 MG PO CP24: 200.0000 mg | ORAL_CAPSULE | Freq: Every day | ORAL | Status: AC

## 2015-06-08 MED ORDER — MAGIC MOUTHWASH: 5.0000 mL | Freq: Four times a day (QID) | ORAL | Status: DC | PRN

## 2015-06-08 NOTE — Discharge Summary (Signed)
[facility] NAME: Dawn Donaldson    MR#:  [id number]  DATE OF BIRTH:  1950-07-16  DATE OF ADMISSION:  06/01/2015 ADMITTING PHYSICIAN: Fritzi Mandes, MD  DATE OF DISCHARGE: 06/08/2015  PRIMARY CARE PHYSICIAN: Lorelee Market, MD    ADMISSION DIAGNOSIS:  COPD exacerbation  DISCHARGE DIAGNOSIS:  Active Problems:   Acute on chronic respiratory failure (HCC)With hypoxia COPD exacerbation  SECONDARY DIAGNOSIS:   Past Medical History  Diagnosis Date  . COPD (chronic obstructive pulmonary disease) ([place])     on nocturnal home o2  . Hypertension   . CAD (coronary artery disease)     s/p PCI  . Diastolic CHF ([place])   . Hyperlipidemia   . GERD (gastroesophageal reflux disease)   . Osteoporosis   . Chronic low back pain   . CVA (cerebral infarction)   . DJD (degenerative joint disease)   . Colon polyps     HOSPITAL COURSE:  Dawn Donaldson  is a 65 y.o. female admitted 06/01/2015 with chief complaint Respiratory Distress and Chest Pain . Please see H&P performed by Fritzi Mandes, MD for further information. Patient was admitted with the above complaints. Originally requiring BiPAP therapy she was placed on steroids and antibiotics scheduled breathing treatments. She made slow but progressive improvement during her stay in the hospital. She was evaluated by pulmonology during her stay. She is now back to her baseline oxygen requirements which is 2 L nasal cannula. She has completed course of empiric antibiotics, there was no evidence suggestive of pneumonia on imaging.  DISCHARGE CONDITIONS:   Stable/improved  CONSULTS OBTAINED:  Treatment Team:  Allyne Gee, MD  DRUG ALLERGIES:   Allergies  Allergen Reactions  . Dexlansoprazole Nausea And Vomiting  . Ultram [Tramadol] Itching    DISCHARGE MEDICATIONS:   Current Discharge Medication List    START taking these medications   Details  magic mouthwash SOLN Take 5 mLs by mouth 4  (four) times daily as needed for mouth pain. Qty: 60 mL, Refills: 0    predniSONE (STERAPRED UNI-PAK 21 TAB) 10 MG (21) TBPK tablet Take 1 tablet (10 mg total) by mouth daily. 40mg  oral 1 day, then 20mg  oral for 2 days, then 10mg  oral 2 days, then stop Qty: 10 tablet, Refills: 0    theophylline (THEO-24) 200 MG 24 hr capsule Take 1 capsule (200 mg total) by mouth daily. Qty: 30 capsule, Refills: 0      CONTINUE these medications which have NOT CHANGED   Details  acetaminophen (TYLENOL) 500 MG tablet Take 1,000 mg by mouth every 6 (six) hours as needed for mild pain or headache.    albuterol (PROVENTIL HFA;VENTOLIN HFA) 108 (90 BASE) MCG/ACT inhaler Inhale 2 puffs into the lungs every 6 (six) hours as needed for wheezing or shortness of breath.     COMBIVENT RESPIMAT 20-100 MCG/ACT AERS respimat Inhale 1 puff into the lungs 4 (four) times daily. Qty: 1 Inhaler, Refills: 2    DALIRESP 500 MCG TABS tablet Take 500 mcg by mouth daily.    Fluticasone-Salmeterol (ADVAIR) 250-50 MCG/DOSE AEPB Inhale 1 puff into the lungs 2 (two) times daily.    furosemide (LASIX) 40 MG tablet Take 40 mg by mouth daily as needed for fluid or edema.     guaiFENesin-codeine 100-10 MG/5ML syrup Take 10 mLs by mouth every 6 (six) hours as needed for cough. Qty: 120 mL, Refills: 0    hydrochlorothiazide (HYDRODIURIL) 12.5 MG tablet  Take 12.5 mg by mouth every evening.    ipratropium-albuterol (DUONEB) 0.5-2.5 (3) MG/3ML SOLN Take 3 mLs by nebulization every 4 (four) hours as needed (for wheezing and/or shortness of breath).     labetalol (NORMODYNE) 100 MG tablet Take 100 mg by mouth 2 (two) times daily.    lisinopril (PRINIVIL,ZESTRIL) 20 MG tablet Take 20 mg by mouth every morning.     nitroGLYCERIN (NITROSTAT) 0.4 MG SL tablet Place 0.4 mg under the tongue every 5 (five) minutes as needed for chest pain.     Oxycodone HCl 10 MG TABS Take 1 tablet (10 mg total) by mouth every 4 (four) hours as needed (for  pain.). Qty: 20 tablet, Refills: 0    clopidogrel (PLAVIX) 75 MG tablet Take 75 mg by mouth every evening.     esomeprazole (NEXIUM) 40 MG capsule Take 40 mg by mouth 2 (two) times daily.    guaiFENesin (MUCINEX) 600 MG 12 hr tablet Take 1 tablet (600 mg total) by mouth 2 (two) times daily. Qty: 10 tablet, Refills: 0    simvastatin (ZOCOR) 20 MG tablet Take 20 mg by mouth every evening.       STOP taking these medications     theophylline (UNIPHYL) 400 MG 24 hr tablet          DISCHARGE INSTRUCTIONS:    DIET:  Regular diet  DISCHARGE CONDITION:  Stable  ACTIVITY:  Activity as tolerated  OXYGEN:  Home Oxygen: Yes.     Oxygen Delivery: 2 liters/min via Patient connected to nasal cannula oxygen  DISCHARGE LOCATION:  home   If you experience worsening of your admission symptoms, develop shortness of breath, life threatening emergency, suicidal or homicidal thoughts you must seek medical attention immediately by calling 911 or calling your MD immediately  if symptoms less severe.  You Must read complete instructions/literature along with all the possible adverse reactions/side effects for all the Medicines you take and that have been prescribed to you. Take any new Medicines after you have completely understood and accpet all the possible adverse reactions/side effects.   Please note  You were cared for by a hospitalist during your hospital stay. If you have any questions about your discharge medications or the care you received while you were in the hospital after you are discharged, you can call the unit and asked to speak with the hospitalist on call if the hospitalist that took care of you is not available. Once you are discharged, your primary care physician will handle any further medical issues. Please note that NO REFILLS for any discharge medications will be authorized once you are discharged, as it is imperative that you return to your primary care physician (or  establish a relationship with a primary care physician if you do not have one) for your aftercare needs so that they can reassess your need for medications and monitor your lab values.    On the day of Discharge:   VITAL SIGNS:  Blood pressure 116/50, pulse 62, temperature 97.6 F (36.4 C), temperature source Oral, resp. rate 16, height 5\' 3"  (1.6 m), weight 210 lb 4.8 oz (95.391 kg), SpO2 100 %.  I/O:   Intake/Output Summary (Last 24 hours) at 06/08/15 0813 Last data filed at 06/07/15 1819  Gross per 24 hour  Intake    720 ml  Output      0 ml  Net    720 ml    PHYSICAL EXAMINATION:  GENERAL:  65 y.o.-year-old patient lying  in the bed with no acute distress.  EYES: Pupils equal, round, reactive to light and accommodation. No scleral icterus. Extraocular muscles intact.  HEENT: Head atraumatic, normocephalic. Oropharynx and nasopharynx clear.  NECK:  Supple, no jugular venous distention. No thyroid enlargement, no tenderness.  LUNGS: scant wheezing, without rales,rhonchi or crepitation. No use of accessory muscles of respiration.  CARDIOVASCULAR: S1, S2 normal. No murmurs, rubs, or gallops.  ABDOMEN: Soft, non-tender, non-distended. Bowel sounds present. No organomegaly or mass.  EXTREMITIES: No pedal edema, cyanosis, or clubbing.  NEUROLOGIC: Cranial nerves II through XII are intact. Muscle strength 5/5 in all extremities. Sensation intact. Gait not checked.  PSYCHIATRIC: The patient is alert and oriented x 3.  SKIN: No obvious rash, lesion, or ulcer.   DATA REVIEW:   CBC  Recent Labs Lab 06/06/15 0435  WBC 13.4*  HGB 13.2  HCT 40.6  PLT 238    Chemistries   Recent Labs Lab 06/05/15 0404 06/06/15 0435 06/08/15 0507  NA 139 140  --   K 3.8 3.8  --   CL 100* 96*  --   CO2 32 34*  --   GLUCOSE 138* 140*  --   BUN 24* 23*  --   CREATININE 0.92 0.96 0.91  CALCIUM 8.8* 8.7*  --   AST 13*  --   --   ALT 12*  --   --   ALKPHOS 58  --   --   BILITOT 0.3  --    --     Cardiac Enzymes  Recent Labs Lab 06/02/15 1528  TROPONINI <0.03    Microbiology Results  Results for orders placed or performed during the hospital encounter of 06/01/15  Culture, blood (routine x 2)     Status: None   Collection Time: 06/01/15 11:49 AM  Result Value Ref Range Status   Specimen Description BLOOD LEFT IV  Final   Special Requests   Final    BOTTLES DRAWN AEROBIC AND ANAEROBIC  AER 10CC ANA 3CC   Culture NO GROWTH 5 DAYS  Final   Report Status 06/06/2015 FINAL  Final  Culture, blood (routine x 2)     Status: None   Collection Time: 06/01/15 11:49 AM  Result Value Ref Range Status   Specimen Description BLOOD RIGHT ANTECUBITAL  Final   Special Requests BOTTLES DRAWN AEROBIC AND ANAEROBIC  4CC  Final   Culture NO GROWTH 5 DAYS  Final   Report Status 06/06/2015 FINAL  Final  MRSA PCR Screening     Status: None   Collection Time: 06/03/15  9:49 PM  Result Value Ref Range Status   MRSA by PCR NEGATIVE NEGATIVE Final    Comment:        The GeneXpert MRSA Assay (FDA approved for NASAL specimens only), is one component of a comprehensive MRSA colonization surveillance program. It is not intended to diagnose MRSA infection nor to guide or monitor treatment for MRSA infections.     RADIOLOGY:  Ct Chest W Contrast  06/06/2015  CLINICAL DATA:  Acute on chronic respiratory failure with COPD exacerbation. Wheezing on the right side. EXAM: CT CHEST WITH CONTRAST TECHNIQUE: Multidetector CT imaging of the chest was performed during intravenous contrast administration. CONTRAST:  22mL ISOVUE-300 IOPAMIDOL (ISOVUE-300) INJECTION 61% COMPARISON:  Chest radiograph 06/04/2015. Chest CT 07/24/2013 and 01/16/2013 FINDINGS: Mediastinum/Lymph Nodes: Normal caliber of the thoracic aorta without dissection. Small amount of atherosclerotic disease at the origin of the great vessels. Great vessels are patent. Mild atherosclerotic  disease in the descending thoracic aorta. There  are coronary artery calcifications. Main pulmonary arteries are patent. No suspicious chest lymphadenopathy. Normal appearance of the esophagus. Lungs/Pleura: Trachea is patent. Probable adherent mucus in the right mainstem bronchus extending into the bronchus intermedius. There is centrilobular emphysema in the upper lungs. Stable punctate nodular density near the right lung apex on sequence 3, image 27 is unchanged and likely benign. 3 mm nodule in the right upper lung on sequence 3, image 33 is stable from an exam on 01/16/2013. Stable pleural-based nodular density along the right major fissure on sequence 3, image 105. Linear densities along the posterior right lower lobe could represent atelectasis or mild scarring. Few pleural-based densities along the left lower lobe on sequence 3, image 96 are stable. There is a stable curvilinear density in the left upper lobe on sequence 3, image 31 which probably represents scarring. Upper abdomen: The gallbladder has been removed. Portal venous system is patent. Stable fullness of the left adrenal gland may represent hyperplasia. No acute abnormality in the upper abdomen. Musculoskeletal: No acute bone abnormality. IMPRESSION: Suspect a small amount of mucus in the right airways as described. Otherwise, no acute chest findings. Centrilobular emphysema. Few punctate nodular densities in the lungs are unchanged since 01/16/2013. These findings are likely benign. Electronically Signed   By: Markus Daft M.D.   On: 06/06/2015 13:12     Management plans discussed with the patient, family and they are in agreement.  CODE STATUS:     Code Status Orders        Start     Ordered   06/01/15 1737  Full code   Continuous     06/01/15 1737    Code Status History    Date Active Date Inactive Code Status Order ID Comments User Context   04/29/2015  7:05 PM 05/02/2015  1:33 PM Full Code [id number]  Epifanio Lesches, MD ED   03/24/2015  3:34 PM 03/27/2015  3:06 PM Full  Code [id number]  Hillary Bow, MD ED   02/26/2015  4:20 PM 02/28/2015  3:30 PM Full Code [id number]  Gladstone Lighter, MD Inpatient   12/28/2014  8:45 PM 01/03/2015  3:36 PM Full Code [id number]  Nicholes Mango, MD Inpatient   09/18/2014  3:28 PM 09/22/2014 11:57 AM Full Code [id number]  Bettey Costa, MD Inpatient   05/23/2014  4:02 PM 05/27/2014  4:46 PM Full Code [id number]  Bettey Costa, MD Inpatient      TOTAL TIME TAKING CARE OF THIS PATIENT: 28 minutes.    Hower,  Karenann Cai.D on 06/08/2015 at 8:13 AM  Between 7am to 6pm - Pager - [phone]  After 6pm go to [email or web address] - Proofreader  [facility]  Office  [phone]  CC: Primary care physician; Lorelee Market, MD

## 2015-06-08 NOTE — Progress Notes (Signed)
bipap refused 

## 2015-06-08 NOTE — Care Management Note (Signed)
Case Management Note  Patient Details  Name: Dawn Donaldson MRN: [id number] Date of Birth: 06/19/1950  Subjective/Objective:      A referral for home health RN was faxed to [facility] requesting [place] per Ms Remus has had 5 hospital admissions within the past 6 months. Mrs Morrell has refused home health in the past.               Action/Plan:   Expected Discharge Date:                  Expected Discharge Plan:     In-House Referral:     Discharge planning Services     Post Acute Care Choice:    Choice offered to:     DME Arranged:    DME Agency:     HH Arranged:    [place] Agency:     Status of Service:     Medicare Important Message Given:  Yes Date Medicare IM Given:    Medicare IM give by:    Date Additional Medicare IM Given:    Additional Medicare Important Message give by:     If discussed at [place] of Stay Meetings, dates discussed:    Additional Comments:  Arlethia Basso A, RN 06/08/2015, 10:16 AM

## 2015-06-08 NOTE — Progress Notes (Signed)
Discharge instructions explained to pt/ verbalized an understanding/ iv removed/ rx given to pt/ transported off unit via wheelchair.

## 2015-06-09 ENCOUNTER — Telehealth: Payer: Self-pay | Admitting: *Deleted

## 2015-06-09 NOTE — Telephone Encounter (Signed)
appt scheduled for 06/20/15 @ 9am. Nothing further needed.

## 2015-06-09 NOTE — Telephone Encounter (Signed)
-----   Message from Laverle Hobby, MD sent at 06/05/2015  5:21 PM EDT ----- Regarding: hfu Pt needs outpt follow up with me in 2-4 weeks, for eval for possible bronchoscopy (pt will continue to fu with Dr. Raul Del as her pulmonologist).

## 2015-06-20 ENCOUNTER — Encounter
Admission: RE | Disposition: A | Discharge status: Home / Self Care | Payer: Self-pay | Source: Ambulatory Visit | Attending: Internal Medicine

## 2015-06-20 ENCOUNTER — Encounter: Payer: Self-pay | Admitting: *Deleted

## 2015-06-20 ENCOUNTER — Ambulatory Visit
Admission: RE | Admit: 2015-06-20 | Discharge: 2015-06-20 | Disposition: A | Discharge status: Home / Self Care | Payer: Medicare Other | Source: Ambulatory Visit | Attending: Internal Medicine | Admitting: Internal Medicine

## 2015-06-20 ENCOUNTER — Institutional Professional Consult (permissible substitution): Payer: Medicare Other | Admitting: Internal Medicine

## 2015-06-20 DIAGNOSIS — I2510 Atherosclerotic heart disease of native coronary artery without angina pectoris: Secondary | ICD-10-CM | POA: Insufficient documentation

## 2015-06-20 DIAGNOSIS — R079 Chest pain, unspecified: Secondary | ICD-10-CM | POA: Diagnosis present

## 2015-06-20 DIAGNOSIS — I739 Peripheral vascular disease, unspecified: Secondary | ICD-10-CM | POA: Insufficient documentation

## 2015-06-20 DIAGNOSIS — F17210 Nicotine dependence, cigarettes, uncomplicated: Secondary | ICD-10-CM | POA: Insufficient documentation

## 2015-06-20 DIAGNOSIS — E7800 Pure hypercholesterolemia, unspecified: Secondary | ICD-10-CM | POA: Insufficient documentation

## 2015-06-20 DIAGNOSIS — Z9109 Other allergy status, other than to drugs and biological substances: Secondary | ICD-10-CM | POA: Diagnosis not present

## 2015-06-20 DIAGNOSIS — E785 Hyperlipidemia, unspecified: Secondary | ICD-10-CM | POA: Insufficient documentation

## 2015-06-20 DIAGNOSIS — M549 Dorsalgia, unspecified: Secondary | ICD-10-CM | POA: Insufficient documentation

## 2015-06-20 DIAGNOSIS — Z833 Family history of diabetes mellitus: Secondary | ICD-10-CM | POA: Insufficient documentation

## 2015-06-20 DIAGNOSIS — Z947 Corneal transplant status: Secondary | ICD-10-CM | POA: Diagnosis not present

## 2015-06-20 DIAGNOSIS — Z79899 Other long term (current) drug therapy: Secondary | ICD-10-CM | POA: Diagnosis not present

## 2015-06-20 DIAGNOSIS — G4733 Obstructive sleep apnea (adult) (pediatric): Secondary | ICD-10-CM | POA: Diagnosis not present

## 2015-06-20 DIAGNOSIS — Z90710 Acquired absence of both cervix and uterus: Secondary | ICD-10-CM | POA: Diagnosis not present

## 2015-06-20 DIAGNOSIS — I110 Hypertensive heart disease with heart failure: Secondary | ICD-10-CM | POA: Insufficient documentation

## 2015-06-20 DIAGNOSIS — Z825 Family history of asthma and other chronic lower respiratory diseases: Secondary | ICD-10-CM | POA: Insufficient documentation

## 2015-06-20 DIAGNOSIS — Z801 Family history of malignant neoplasm of trachea, bronchus and lung: Secondary | ICD-10-CM | POA: Insufficient documentation

## 2015-06-20 DIAGNOSIS — K449 Diaphragmatic hernia without obstruction or gangrene: Secondary | ICD-10-CM | POA: Insufficient documentation

## 2015-06-20 DIAGNOSIS — K219 Gastro-esophageal reflux disease without esophagitis: Secondary | ICD-10-CM | POA: Diagnosis not present

## 2015-06-20 DIAGNOSIS — Z6841 Body Mass Index (BMI) 40.0 and over, adult: Secondary | ICD-10-CM | POA: Diagnosis not present

## 2015-06-20 DIAGNOSIS — E669 Obesity, unspecified: Secondary | ICD-10-CM | POA: Diagnosis not present

## 2015-06-20 DIAGNOSIS — I509 Heart failure, unspecified: Secondary | ICD-10-CM | POA: Diagnosis not present

## 2015-06-20 DIAGNOSIS — Z8673 Personal history of transient ischemic attack (TIA), and cerebral infarction without residual deficits: Secondary | ICD-10-CM | POA: Insufficient documentation

## 2015-06-20 DIAGNOSIS — Z8249 Family history of ischemic heart disease and other diseases of the circulatory system: Secondary | ICD-10-CM | POA: Insufficient documentation

## 2015-06-20 DIAGNOSIS — Z955 Presence of coronary angioplasty implant and graft: Secondary | ICD-10-CM | POA: Diagnosis not present

## 2015-06-20 DIAGNOSIS — J449 Chronic obstructive pulmonary disease, unspecified: Secondary | ICD-10-CM | POA: Diagnosis not present

## 2015-06-20 HISTORY — DX: Low back pain, unspecified: M54.50

## 2015-06-20 HISTORY — DX: Pure hypercholesterolemia, unspecified: E78.00

## 2015-06-20 HISTORY — DX: Hypoxemia: R09.02

## 2015-06-20 HISTORY — DX: Low back pain: M54.5

## 2015-06-20 HISTORY — DX: Peripheral vascular disease, unspecified: I73.9

## 2015-06-20 HISTORY — DX: Angina pectoris, unspecified: I20.9

## 2015-06-20 HISTORY — DX: Cerebral infarction, unspecified: I63.9

## 2015-06-20 HISTORY — PX: CARDIAC CATHETERIZATION: SHX172

## 2015-06-20 HISTORY — DX: Benign neoplasm of colon, unspecified: D12.6

## 2015-06-20 HISTORY — DX: Obesity, unspecified: E66.9

## 2015-06-20 HISTORY — DX: Other chronic pain: G89.29

## 2015-06-20 HISTORY — DX: Personal history of other diseases of the digestive system: Z87.19

## 2015-06-20 SURGERY — LEFT HEART CATH AND CORONARY ANGIOGRAPHY
Anesthesia: Moderate Sedation

## 2015-06-20 SURGERY — LEFT HEART CATH AND CORONARY ANGIOGRAPHY
Anesthesia: Moderate Sedation | Laterality: Left

## 2015-06-20 MED ORDER — HEPARIN (PORCINE) IN NACL 2-0.9 UNIT/ML-% IJ SOLN
INTRAMUSCULAR | Status: AC
  Filled 2015-06-20: qty 500

## 2015-06-20 MED ORDER — SODIUM CHLORIDE 0.9 % WEIGHT BASED INFUSION: 3.0000 mL/kg/h | INTRAVENOUS | Status: DC

## 2015-06-20 MED ORDER — IPRATROPIUM-ALBUTEROL 0.5-2.5 (3) MG/3ML IN SOLN: 3.0000 mL | Freq: Four times a day (QID) | RESPIRATORY_TRACT | Status: DC

## 2015-06-20 MED ORDER — ASPIRIN 81 MG PO CHEW: 81.0000 mg | CHEWABLE_TABLET | ORAL | Status: DC

## 2015-06-20 MED ORDER — FENTANYL CITRATE (PF) 100 MCG/2ML IJ SOLN
INTRAMUSCULAR | Status: DC | PRN
  Administered 2015-06-20: 25 ug via INTRAVENOUS

## 2015-06-20 MED ORDER — SODIUM CHLORIDE 0.9 % IV SOLN: 250.0000 mL | INTRAVENOUS | Status: DC | PRN

## 2015-06-20 MED ORDER — MIDAZOLAM HCL 2 MG/2ML IJ SOLN
INTRAMUSCULAR | Status: AC
  Filled 2015-06-20: qty 2

## 2015-06-20 MED ORDER — SODIUM CHLORIDE 0.9% FLUSH: 3.0000 mL | Freq: Two times a day (BID) | INTRAVENOUS | Status: DC

## 2015-06-20 MED ORDER — FENTANYL CITRATE (PF) 100 MCG/2ML IJ SOLN
INTRAMUSCULAR | Status: AC
  Filled 2015-06-20: qty 2

## 2015-06-20 MED ORDER — SODIUM CHLORIDE 0.9% FLUSH: 3.0000 mL | INTRAVENOUS | Status: DC | PRN

## 2015-06-20 MED ORDER — IOPAMIDOL (ISOVUE-300) INJECTION 61%
INTRAVENOUS | Status: DC | PRN
  Administered 2015-06-20: 110 mL via INTRA_ARTERIAL

## 2015-06-20 MED ORDER — SODIUM CHLORIDE 0.9 % WEIGHT BASED INFUSION
1.0000 mL/kg/h | INTRAVENOUS | Status: DC
Start: 2015-06-21 — End: 2015-06-20

## 2015-06-20 MED ORDER — MIDAZOLAM HCL 2 MG/2ML IJ SOLN
INTRAMUSCULAR | Status: DC | PRN
  Administered 2015-06-20: 1 mg via INTRAVENOUS

## 2015-06-20 SURGICAL SUPPLY — 10 items
CATH INFINITI 5FR ANG PIGTAIL (CATHETERS) ×2 IMPLANT
CATH INFINITI 5FR JL4 (CATHETERS) ×2 IMPLANT
CATH INFINITI JR4 5F (CATHETERS) ×2 IMPLANT
DEVICE CLOSURE MYNXGRIP 5F (Vascular Products) ×2 IMPLANT
KIT MANI 3VAL PERCEP (MISCELLANEOUS) ×3 IMPLANT
NDL PERC 18GX7CM (NEEDLE) IMPLANT
NEEDLE PERC 18GX7CM (NEEDLE) ×3 IMPLANT
PACK CARDIAC CATH (CUSTOM PROCEDURE TRAY) ×3 IMPLANT
SHEATH PINNACLE 5F 10CM (SHEATH) ×2 IMPLANT
WIRE EMERALD 3MM-J .035X150CM (WIRE) ×2 IMPLANT

## 2015-06-20 NOTE — Discharge Instructions (Signed)

## 2015-06-23 ENCOUNTER — Encounter: Payer: Self-pay | Admitting: Internal Medicine

## 2015-07-04 ENCOUNTER — Institutional Professional Consult (permissible substitution): Payer: Medicare Other | Admitting: Pulmonary Disease

## 2015-07-21 ENCOUNTER — Ambulatory Visit (INDEPENDENT_AMBULATORY_CARE_PROVIDER_SITE_OTHER): Payer: Medicare Other | Admitting: Internal Medicine

## 2015-07-21 ENCOUNTER — Encounter: Payer: Self-pay | Admitting: Internal Medicine

## 2015-07-21 VITALS — BP 136/70 | HR 89 | Ht 61.0 in | Wt 203.0 lb

## 2015-07-21 DIAGNOSIS — J449 Chronic obstructive pulmonary disease, unspecified: Secondary | ICD-10-CM

## 2015-07-21 DIAGNOSIS — R918 Other nonspecific abnormal finding of lung field: Secondary | ICD-10-CM | POA: Diagnosis not present

## 2015-07-21 DIAGNOSIS — Z716 Tobacco abuse counseling: Secondary | ICD-10-CM | POA: Diagnosis not present

## 2015-07-21 DIAGNOSIS — J441 Chronic obstructive pulmonary disease with (acute) exacerbation: Secondary | ICD-10-CM

## 2015-07-21 MED ORDER — AZITHROMYCIN 250 MG PO TABS: ORAL_TABLET | ORAL | Status: AC

## 2015-07-21 MED ORDER — PREDNISONE 20 MG PO TABS: 20.0000 mg | ORAL_TABLET | Freq: Every day | ORAL | Status: AC

## 2015-07-21 NOTE — Progress Notes (Signed)
[facility] Consultation    Date: 07/21/2015  MRN# [id number] Dawn Donaldson November 28, 1950  Referring Physician: Dr. Lavetta Nielsen [facility]).  PMD - Dr. Secundino Ginger Shela Sayarath is a 65 y.o. old female seen in consultation for hospital follow for bronch evaluation.   CC:  Chief Complaint  Patient presents with  . PULOMARY CONSULT    [place] pt for poss bronch. c/o non prod cough/sob with exertion/wheezing X4-5d    HPI:  Patient is a pleasant 65 year old female past May, history of COPD, tobacco abuse, hypertension, coronary artery disease, diastolic heart failure, hyperlipidemia, seen in follow-up today for COPD exacerbation and possible endobronchial lesion in the right mainstem. She was seen by [facility] during her inpatient stay, for bronchial evaluation at that time, however she was in respiratory distress requiring BiPAP and steroids along with antibiotics and bronchodilators, and to unstable to perform bronchoscopy. She follows with Dr. Vella Kohler for her pulmonary issues, and he kindly asked [facility] see her for bronchoscopy evaluation. Today she states that over the past 16 she's been having cough, wheezing, shortness of breath, she's been using her DuoNeb 4 times per day since the past 6 days. She does not have any sputum production. Today she is accompanied by her husband today. She is still smoking on a daily basis, last cigarette was yesterday.    PMHX:   Past Medical History  Diagnosis Date  . COPD (chronic obstructive pulmonary disease) ([place])     on nocturnal home o2  . Hypertension   . CAD (coronary artery disease)     s/p PCI  . Diastolic CHF ([place])   . Hyperlipidemia   . GERD (gastroesophageal reflux disease)   . Osteoporosis   . Chronic low back pain   . CVA (cerebral infarction)   . DJD (degenerative joint disease)   . Colon polyps   . Anginal pain ([place])   . Chronic lower back pain   . Stroke ([place])   . DJD (degenerative joint  disease)   . History of hiatal hernia   . Adenomatous colon polyp   . Hypercholesteremia   . Hypoxemia   . Obesity   . Peripheral vascular disease [facility])    Surgical Hx:  Past Surgical History  Procedure Laterality Date  . Carotid endarterectomy  2009    Right  . Cholecystectomy    . Appendectomy    . Partial hysterectomy    . Coronary angioplasty  2008    with stent  . Back surgery  2002,2003  . Implantation bone anchored hearing aid  2010  . Abdominal hysterectomy    . Eye surgery    . Vein ligation and stripping    . Cardiac catheterization Left 06/20/2015    Procedure: Left Heart Cath and Coronary Angiography;  Surgeon: Yolonda Kida, MD;  Location: [facility];  Service: Cardiovascular;  Laterality: Left;   Family Hx:  Family History  Problem Relation Age of Onset  . Atrial fibrillation Mother   . CAD Father    Social Hx:   Social History  Substance Use Topics  . Smoking status: Current Some Day Smoker -- 0.10 packs/day for 40 years    Types: Cigarettes    Last Attempt to Quit: 03/20/2015  . Smokeless tobacco: Never Used     Comment: a pack can last 3-61mo  . Alcohol Use: No   Medication:   Current Outpatient Rx  Name  Route  Sig  Dispense  Refill  .  acetaminophen (TYLENOL) 500 MG tablet   Oral   Take 1,000 mg by mouth every 6 (six) hours as needed for mild pain or headache.         . albuterol (PROVENTIL HFA;VENTOLIN HFA) 108 (90 BASE) MCG/ACT inhaler   Inhalation   Inhale 2 puffs into the lungs every 6 (six) hours as needed for wheezing or shortness of breath.          . clopidogrel (PLAVIX) 75 MG tablet   Oral   Take 75 mg by mouth every evening.          . COMBIVENT RESPIMAT 20-100 MCG/ACT AERS respimat   Inhalation   Inhale 1 puff into the lungs 4 (four) times daily.   1 Inhaler   2     Dispense as written.   Marland Kitchen esomeprazole (NEXIUM) 40 MG capsule   Oral   Take 40 mg by mouth 2 (two) times daily.         .  Fluticasone-Salmeterol (ADVAIR) 250-50 MCG/DOSE AEPB   Inhalation   Inhale 1 puff into the lungs 2 (two) times daily.         . furosemide (LASIX) 40 MG tablet   Oral   Take 40 mg by mouth daily as needed for fluid or edema.          Marland Kitchen guaiFENesin (MUCINEX) 600 MG 12 hr tablet   Oral   Take 1 tablet (600 mg total) by mouth 2 (two) times daily.   10 tablet   0   . guaiFENesin-codeine 100-10 MG/5ML syrup   Oral   Take 10 mLs by mouth every 6 (six) hours as needed for cough.   120 mL   0   . hydrochlorothiazide (HYDRODIURIL) 12.5 MG tablet   Oral   Take 12.5 mg by mouth every evening.         Marland Kitchen ipratropium-albuterol (DUONEB) 0.5-2.5 (3) MG/3ML SOLN   Nebulization   Take 3 mLs by nebulization every 4 (four) hours as needed (for wheezing and/or shortness of breath).          . labetalol (NORMODYNE) 100 MG tablet   Oral   Take 100 mg by mouth 2 (two) times daily.         Marland Kitchen lisinopril (PRINIVIL,ZESTRIL) 20 MG tablet   Oral   Take 20 mg by mouth every morning.          . nitroGLYCERIN (NITROSTAT) 0.4 MG SL tablet   Sublingual   Place 0.4 mg under the tongue every 5 (five) minutes as needed for chest pain.          . Oxycodone HCl 10 MG TABS   Oral   Take 1 tablet (10 mg total) by mouth every 4 (four) hours as needed (for pain.).   20 tablet   0   . simvastatin (ZOCOR) 20 MG tablet   Oral   Take 20 mg by mouth every evening.          . theophylline (THEO-24) 200 MG 24 hr capsule   Oral   Take 1 capsule (200 mg total) by mouth daily.   30 capsule   0   . azithromycin (ZITHROMAX) 250 MG tablet      Take 2 tablets (500 mg) on  Day 1,  followed by 1 tablet (250 mg) once daily on Days 2 through 5.   6 each   0   . predniSONE (DELTASONE) 20 MG tablet   Oral  Take 1 tablet (20 mg total) by mouth daily.   5 tablet   0       Allergies:  Dexlansoprazole and Ultram  Review of Systems  Constitutional: Negative for fever and chills.  HENT:  Positive for sore throat.   Eyes: Negative for blurred vision.  Respiratory: Positive for cough, shortness of breath and wheezing.   Gastrointestinal: Negative for heartburn and nausea.  Musculoskeletal: Negative for myalgias.  Skin: Negative for itching and rash.  Neurological: Negative for dizziness.  Endo/Heme/Allergies: Does not bruise/bleed easily.     Physical Examination:   VS: BP 136/70 mmHg  Pulse 89  Ht 5\' 1"  (1.549 m)  Wt 203 lb (92.08 kg)  BMI 38.38 kg/m2  SpO2 96%  General Appearance: No distress  Neuro:without focal findings, mental status, speech normal, alert and oriented, cranial nerves 2-12 intact, reflexes normal and symmetric, sensation grossly normal  HEENT: PERRLA, EOM intact, no ptosis, no other lesions noticed; Mallampati 3 Pulmonary:shallow breath sounds, diffuse expiratory wheezing, coarse upper airway sounds, no use of accessory muscles.  CardiovascularNormal S1,S2.  No m/r/g.  Abdominal aorta pulsation normal.    Abdomen: Benign, Soft, non-tender, No masses, hepatosplenomegaly, No lymphadenopathy Renal:  No costovertebral tenderness  GU:  No performed at this time. Endoc: No evident thyromegaly, no signs of acromegaly or Cushing features Skin:   warm, no rashes, no ecchymosis  Extremities: normal, no cyanosis, clubbing, no edema, warm with normal capillary refill. Other findings:none   Labs results:   Rad results: (The following images and results were reviewed by Dr. Stevenson Clinch on 07/21/2015).  CT Chest 06/06/15 CT CHEST WITH CONTRAST  TECHNIQUE: Multidetector CT imaging of the chest was performed during intravenous contrast administration.  CONTRAST: 27mL ISOVUE-300 IOPAMIDOL (ISOVUE-300) INJECTION 61%  COMPARISON: Chest radiograph 06/04/2015. Chest CT 07/24/2013 and 01/16/2013  FINDINGS: Mediastinum/Lymph Nodes: Normal caliber of the thoracic aorta without dissection. Small amount of atherosclerotic disease at the origin of the great  vessels. Great vessels are patent. Mild atherosclerotic disease in the descending thoracic aorta. There are coronary artery calcifications. Main pulmonary arteries are patent. No suspicious chest lymphadenopathy. Normal appearance of the esophagus.  Lungs/Pleura: Trachea is patent. Probable adherent mucus in the right mainstem bronchus extending into the bronchus intermedius. There is centrilobular emphysema in the upper lungs. Stable punctate nodular density near the right lung apex on sequence 3, image 27 is unchanged and likely benign. 3 mm nodule in the right upper lung on sequence 3, image 33 is stable from an exam on 01/16/2013. Stable pleural-based nodular density along the right major fissure on sequence 3, image 105. Linear densities along the posterior right lower lobe could represent atelectasis or mild scarring. Few pleural-based densities along the left lower lobe on sequence 3, image 96 are stable. There is a stable curvilinear density in the left upper lobe on sequence 3, image 31 which probably represents scarring.  Upper abdomen: The gallbladder has been removed. Portal venous system is patent. Stable fullness of the left adrenal gland may represent hyperplasia. No acute abnormality in the upper abdomen.  Musculoskeletal: No acute bone abnormality.  IMPRESSION: Suspect a small amount of mucus in the right airways as described. Otherwise, no acute chest findings.  Centrilobular emphysema.  Few punctate nodular densities in the lungs are unchanged since 01/16/2013. These findings are likely benign.    Assessment and Plan: 65 year old female seen in in clinic today for hospital follow-up visit of COPD exacerbation along with evaluation for possible bronchoscopy. Abnormal CT scan  of lung She'll lung CT reviewed, this seems to be multiple small pulmonary nodules. However dominant finding is a possible mucous plug versus endobronchial lesion in the right  mainstem airway. At this time she is again having early signs of any acute COPD exacerbation will be treated as such.  Plan: -Treat acute COPD exacerbation -Repeat CT chest prior to follow-up visit with Dr. Ashby Dawes  COPD exacerbation [facility]) Short known history of COPD, still currently smoking. Now with increased cough, wheezing, shortness of breath, increased use of rescue inhaler. COPD exacerbation  Plan: -Prednisone 20 mg 5 days, 1 tab by mouth with breakfast -Z-Pak use as directed -Use DuoNeb every 6 hours 4 days then as needed -use incentive spirometry 10-15 times per day -Tobacco avoidance including avoidance of secondhand smoke, he cigarettes, vapors, the noxious substances  COPD (chronic obstructive pulmonary disease) History of COPD, follows with Dr. Vella Kohler. Plan: -Currently with COPD exacerbation, will treat as such -Keep follow-up with Dr. Vella Kohler follow the pulmonary issues  Pulmonary nodules Patient CT scan with small pulmonary nodules subcentimeter in size. 2 small for any significant biopsy using navigation. At this time she does have a mucous plug versus a endobronchial lesion in the right mainstem bronchus, however respiratory Y she is too unstable for bronchoscopy at this time, given that she's in another COPD exacerbation.  Plan: -Repeat CT chest prior to follow-up visit -Optimize COPD exacerbation treatment.  Tobacco abuse counseling Tobacco Cessation - Counseling regarding benefits of smoking cessation strategies was provided for more than 12 min. - Educated that at this time smoking- cessation represents the single most important step that patient can take to enhance the length and quality of live. - Educated patient regarding alternatives of behavior interventions, pharmacotherapy including NRT and non-nicotine therapy such, and combinations of both. - Patient at this time:will try to quit on her own.      Updated Medication List Outpatient  Encounter Prescriptions as of 07/21/2015  Medication Sig  . acetaminophen (TYLENOL) 500 MG tablet Take 1,000 mg by mouth every 6 (six) hours as needed for mild pain or headache.  . albuterol (PROVENTIL HFA;VENTOLIN HFA) 108 (90 BASE) MCG/ACT inhaler Inhale 2 puffs into the lungs every 6 (six) hours as needed for wheezing or shortness of breath.   . clopidogrel (PLAVIX) 75 MG tablet Take 75 mg by mouth every evening.   . COMBIVENT RESPIMAT 20-100 MCG/ACT AERS respimat Inhale 1 puff into the lungs 4 (four) times daily.  Marland Kitchen esomeprazole (NEXIUM) 40 MG capsule Take 40 mg by mouth 2 (two) times daily.  . Fluticasone-Salmeterol (ADVAIR) 250-50 MCG/DOSE AEPB Inhale 1 puff into the lungs 2 (two) times daily.  . furosemide (LASIX) 40 MG tablet Take 40 mg by mouth daily as needed for fluid or edema.   Marland Kitchen guaiFENesin (MUCINEX) 600 MG 12 hr tablet Take 1 tablet (600 mg total) by mouth 2 (two) times daily.  Marland Kitchen guaiFENesin-codeine 100-10 MG/5ML syrup Take 10 mLs by mouth every 6 (six) hours as needed for cough.  . hydrochlorothiazide (HYDRODIURIL) 12.5 MG tablet Take 12.5 mg by mouth every evening.  Marland Kitchen ipratropium-albuterol (DUONEB) 0.5-2.5 (3) MG/3ML SOLN Take 3 mLs by nebulization every 4 (four) hours as needed (for wheezing and/or shortness of breath).   . labetalol (NORMODYNE) 100 MG tablet Take 100 mg by mouth 2 (two) times daily.  Marland Kitchen lisinopril (PRINIVIL,ZESTRIL) 20 MG tablet Take 20 mg by mouth every morning.   . nitroGLYCERIN (NITROSTAT) 0.4 MG SL tablet Place 0.4 mg under the  tongue every 5 (five) minutes as needed for chest pain.   . Oxycodone HCl 10 MG TABS Take 1 tablet (10 mg total) by mouth every 4 (four) hours as needed (for pain.).  Marland Kitchen simvastatin (ZOCOR) 20 MG tablet Take 20 mg by mouth every evening.   . theophylline (THEO-24) 200 MG 24 hr capsule Take 1 capsule (200 mg total) by mouth daily.  Marland Kitchen azithromycin (ZITHROMAX) 250 MG tablet Take 2 tablets (500 mg) on  Day 1,  followed by 1 tablet (250 mg)  once daily on Days 2 through 5.  . predniSONE (DELTASONE) 20 MG tablet Take 1 tablet (20 mg total) by mouth daily.  . [DISCONTINUED] DALIRESP 500 MCG TABS tablet Take 500 mcg by mouth daily. Reported on 07/21/2015  . [DISCONTINUED] magic mouthwash SOLN Take 5 mLs by mouth 4 (four) times daily as needed for mouth pain. (Patient not taking: Reported on 07/21/2015)  . [DISCONTINUED] predniSONE (STERAPRED UNI-PAK 21 TAB) 10 MG (21) TBPK tablet Take 1 tablet (10 mg total) by mouth daily. 40mg  oral 1 day, then 20mg  oral for 2 days, then 10mg  oral 2 days, then stop (Patient not taking: Reported on 07/21/2015)   No facility-administered encounter medications on file as of 07/21/2015.    Orders for this visit: Orders Placed This Encounter  Procedures  . CT CHEST W CONTRAST    Standing Status: Future     Number of Occurrences:      Standing Expiration Date: 09/19/2016    Order Specific Question:  Reason for Exam (SYMPTOM  OR DIAGNOSIS REQUIRED)    Answer:  nodule    Order Specific Question:  Preferred imaging location?    Answer:  [facility]  . Basic metabolic panel    Standing Status: Future     Number of Occurrences:      Standing Expiration Date: 07/20/2016     Thank  you for the consultation and for allowing [facility] to assist in the care of your patient. Our recommendations are noted above.  Please contact us if we can be of further service.   Vilinda Boehringer, MD [facility] Office Number: [phone]  Note: This note was prepared with Dragon dictation along with smaller phrase technology. Any transcriptional errors that result from this process are unintentional.

## 2015-07-21 NOTE — Assessment & Plan Note (Signed)
Patient CT scan with small pulmonary nodules subcentimeter in size. 2 small for any significant biopsy using navigation. At this time she does have a mucous plug versus a endobronchial lesion in the right mainstem bronchus, however respiratory Y she is too unstable for bronchoscopy at this time, given that she's in another COPD exacerbation.  Plan: -Repeat CT chest prior to follow-up visit -Optimize COPD exacerbation treatment.

## 2015-07-21 NOTE — Assessment & Plan Note (Signed)
History of COPD, follows with Dr. Vella Kohler. Plan: -Currently with COPD exacerbation, will treat as such -Keep follow-up with Dr. Vella Kohler follow the pulmonary issues

## 2015-07-21 NOTE — Assessment & Plan Note (Signed)
Short known history of COPD, still currently smoking. Now with increased cough, wheezing, shortness of breath, increased use of rescue inhaler. COPD exacerbation  Plan: -Prednisone 20 mg 5 days, 1 tab by mouth with breakfast -Z-Pak use as directed -Use DuoNeb every 6 hours 4 days then as needed -use incentive spirometry 10-15 times per day -Tobacco avoidance including avoidance of secondhand smoke, he cigarettes, vapors, the noxious substances

## 2015-07-21 NOTE — Assessment & Plan Note (Signed)
Tobacco Cessation - Counseling regarding benefits of smoking cessation strategies was provided for more than 12 min. - Educated that at this time smoking- cessation represents the single most important step that patient can take to enhance the length and quality of live. - Educated patient regarding alternatives of behavior interventions, pharmacotherapy including NRT and non-nicotine therapy such, and combinations of both. - Patient at this time: will try to quit on her own 

## 2015-07-21 NOTE — Assessment & Plan Note (Signed)
She'll lung CT reviewed, this seems to be multiple small pulmonary nodules. However dominant finding is a possible mucous plug versus endobronchial lesion in the right mainstem airway. At this time she is again having early signs of any acute COPD exacerbation will be treated as such.  Plan: -Treat acute COPD exacerbation -Repeat CT chest prior to follow-up visit with Dr. Ashby Dawes

## 2015-07-21 NOTE — Patient Instructions (Addendum)
Follow up with Dr. Juanell Fairly in: 4 weeks - Prednisone 20 mg x 5 days - 1 tab PO with Breakfast - Zpak - use as directed.  - use your duoneb every 6 hours x 4 days, then as needed - incentive spirometry 10-15 time per day - stop smoking - avoid all forms of tobacco - including second hand smokie, ecigs, vapors, and other noxious substances.  - CT Chest with contrast lung nodule, BMP prior to CT scan

## 2015-08-11 ENCOUNTER — Ambulatory Visit
Admission: RE | Admit: 2015-08-11 | Discharge: 2015-08-11 | Disposition: A | Discharge status: Home / Self Care | Payer: Medicare Other | Source: Ambulatory Visit | Attending: Internal Medicine | Admitting: Internal Medicine

## 2015-08-11 ENCOUNTER — Other Ambulatory Visit
Admission: RE | Admit: 2015-08-11 | Discharge: 2015-08-11 | Disposition: A | Discharge status: Home / Self Care | Payer: Medicare Other | Source: Ambulatory Visit | Attending: Internal Medicine | Admitting: Internal Medicine

## 2015-08-11 DIAGNOSIS — R918 Other nonspecific abnormal finding of lung field: Secondary | ICD-10-CM | POA: Diagnosis present

## 2015-08-11 DIAGNOSIS — J439 Emphysema, unspecified: Secondary | ICD-10-CM | POA: Insufficient documentation

## 2015-08-11 DIAGNOSIS — I2510 Atherosclerotic heart disease of native coronary artery without angina pectoris: Secondary | ICD-10-CM | POA: Insufficient documentation

## 2015-08-11 DIAGNOSIS — I700 Atherosclerosis of aorta: Secondary | ICD-10-CM | POA: Diagnosis not present

## 2015-08-11 DIAGNOSIS — J449 Chronic obstructive pulmonary disease, unspecified: Secondary | ICD-10-CM

## 2015-08-11 LAB — BASIC METABOLIC PANEL
ANION GAP: 6 (ref 5–15)
BUN: 12 mg/dL (ref 6–20)
CHLORIDE: 103 mmol/L (ref 101–111)
CO2: 28 mmol/L (ref 22–32)
Calcium: 9.2 mg/dL (ref 8.9–10.3)
Creatinine, Ser: 0.83 mg/dL (ref 0.44–1.00)
GFR calc Af Amer: >60 mL/min (ref >60)
GFR calc non Af Amer: >60 mL/min (ref >60)
GLUCOSE: 99 mg/dL (ref 65–99)
POTASSIUM: 4.2 mmol/L (ref 3.5–5.1)
Sodium: 137 mmol/L (ref 135–145)

## 2015-08-11 LAB — POCT I-STAT CREATININE: CREATININE: 0.8 mg/dL (ref 0.44–1.00)

## 2015-08-11 MED ORDER — IOPAMIDOL (ISOVUE-300) INJECTION 61%
75.0000 mL | Freq: Once | INTRAVENOUS | Status: AC | PRN
  Administered 2015-08-11: 75 mL via INTRAVENOUS

## 2015-08-14 ENCOUNTER — Telehealth: Payer: Self-pay | Admitting: *Deleted

## 2015-08-14 NOTE — Progress Notes (Deleted)
* [facility]     Assessment and Plan:  PULMONARY A:-Acute exacerbation of COPD, with acute bronchitis. -Review of the chest x-ray shows chronic changes of hyperinflation/COPD. -Chronic respiratory failure on home oxygen with severe baseline emphysema, baseline FEV1 is 28% of predicted. Right endobronchial lesion is no longer seen and likely represented mucus impaction.  -No further follow up needed in regards to this.   Lung nodules.  --Stable right lung nodules, 86mm or less, doubtful that these represent malignancy.  --Pt will continue to follow up with Dr. Raul Del in regards to her pulmonary issues.    Date: 08/14/2015  MRN# [id number] Dawn Donaldson, Dawn Donaldson   Dawn Donaldson is a 65 y.o. old female seen in follow up for chief complaint of  No chief complaint on file.    HPI:   The patient is a 65 yo female smoker who follow with Dr. Raul Del for severe COPD. She was seen in the hospital and asked to follow up with [place] for possible bronchoscopy due to finding on CT chest of possible RML endobronchial lesion vs. Mucus impaction. She was seen by Dr. Stevenson Clinch on 7/17 and was sent for a repeat CT chest.  Review of most recent CT images and in comparison with previous CT images shows that the previously seen right mainstem endobronchial lesion has resolved. There are tiny, barely visible nodules in the right lung which are unchanged.    Medication:   Outpatient Encounter Prescriptions as of 08/18/2015  Medication Sig  . acetaminophen (TYLENOL) 500 MG tablet Take 1,000 mg by mouth every 6 (six) hours as needed for mild pain or headache.  . albuterol (PROVENTIL HFA;VENTOLIN HFA) 108 (90 BASE) MCG/ACT inhaler Inhale 2 puffs into the lungs every 6 (six) hours as needed for wheezing or shortness of breath.   . clopidogrel (PLAVIX) 75 MG tablet Take 75 mg by mouth every evening.   . COMBIVENT RESPIMAT 20-100 MCG/ACT AERS respimat Inhale 1 puff into the lungs 4 (four)  times daily.  Marland Kitchen esomeprazole (NEXIUM) 40 MG capsule Take 40 mg by mouth 2 (two) times daily.  . Fluticasone-Salmeterol (ADVAIR) 250-50 MCG/DOSE AEPB Inhale 1 puff into the lungs 2 (two) times daily.  . furosemide (LASIX) 40 MG tablet Take 40 mg by mouth daily as needed for fluid or edema.   Marland Kitchen guaiFENesin (MUCINEX) 600 MG 12 hr tablet Take 1 tablet (600 mg total) by mouth 2 (two) times daily.  Marland Kitchen guaiFENesin-codeine 100-10 MG/5ML syrup Take 10 mLs by mouth every 6 (six) hours as needed for cough.  . hydrochlorothiazide (HYDRODIURIL) 12.5 MG tablet Take 12.5 mg by mouth every evening.  Marland Kitchen ipratropium-albuterol (DUONEB) 0.5-2.5 (3) MG/3ML SOLN Take 3 mLs by nebulization every 4 (four) hours as needed (for wheezing and/or shortness of breath).   . labetalol (NORMODYNE) 100 MG tablet Take 100 mg by mouth 2 (two) times daily.  Marland Kitchen lisinopril (PRINIVIL,ZESTRIL) 20 MG tablet Take 20 mg by mouth every morning.   . nitroGLYCERIN (NITROSTAT) 0.4 MG SL tablet Place 0.4 mg under the tongue every 5 (five) minutes as needed for chest pain.   . Oxycodone HCl 10 MG TABS Take 1 tablet (10 mg total) by mouth every 4 (four) hours as needed (for pain.).  Marland Kitchen predniSONE (DELTASONE) 20 MG tablet Take 1 tablet (20 mg total) by mouth daily.  . simvastatin (ZOCOR) 20 MG tablet Take 20 mg by mouth every evening.   . theophylline (THEO-24) 200 MG 24 hr capsule Take 1  capsule (200 mg total) by mouth daily.   No facility-administered encounter medications on file as of 08/18/2015.      Allergies:  Dexlansoprazole and Ultram [tramadol]  Review of Systems: Gen:  Denies  fever, sweats. HEENT: Denies blurred vision. Cvc:  No dizziness, chest pain or heaviness Resp:   Denies cough or sputum porduction. Gi: Denies swallowing difficulty, stomach pain. constipation, bowel incontinence Gu:  Denies bladder incontinence, burning urine Ext:   No Joint pain, stiffness. Skin: No skin rash, easy bruising. Endoc:  No polyuria,  polydipsia. Psych: No depression, insomnia. Other:  All other systems were reviewed and found to be negative other than what is mentioned in the HPI.   Physical Examination:   VS: There were no vitals taken for this visit.  General Appearance: No distress  Neuro:without focal findings,  speech normal,  HEENT: PERRLA, EOM intact. Pulmonary: normal breath sounds, No wheezing.   CardiovascularNormal S1,S2.  No m/r/g.   Abdomen: Benign, Soft, non-tender. Renal:  No costovertebral tenderness  GU:  Not performed at this time. Endoc: No evident thyromegaly, no signs of acromegaly. Skin:   warm, no rash. Extremities: normal, no cyanosis, clubbing.   LABORATORY PANEL:   CBC No results for input(s): WBC, HGB, HCT, PLT in the last 168 hours. ------------------------------------------------------------------------------------------------------------------  Chemistries   Recent Labs Lab 08/11/15 1032  NA 137  K 4.2  CL 103  CO2 28  GLUCOSE 99  BUN 12  CREATININE 0.83  CALCIUM 9.2   ------------------------------------------------------------------------------------------------------------------  Cardiac Enzymes No results for input(s): TROPONINI in the last 168 hours. ------------------------------------------------------------  RADIOLOGY:   No results found for this or any previous visit. Results for orders placed during the hospital encounter of 03/24/15  DG Chest 2 View   Narrative CLINICAL DATA:  65 year old female with shortness of breath this morning (awoken at 3 a.m. with acute symptoms). Right smoker. History of COPD. Prior history of myocardial infarction.  EXAM: CHEST  2 VIEW  COMPARISON:  Chest x-ray 03/09/2015.  FINDINGS: Lung volumes are normal. No consolidative airspace disease. No pleural effusions. No pneumothorax. No pulmonary nodule or mass noted. Pulmonary vasculature and the cardiomediastinal silhouette are within normal limits. Atherosclerosis  in the thoracic aorta.  IMPRESSION: 1. No radiographic evidence of acute cardiopulmonary disease. 2. Atherosclerosis.   Electronically Signed   By: Vinnie Langton M.D.   On: 03/24/2015 14:02    ------------------------------------------------------------------------------------------------------------------  Thank  you for allowing [facility] to assist in the care of your patient. Our recommendations are noted above.  Please contact us if we can be of further service.   Marda Stalker, MD.  [facility] Office Number: [phone]  Dawn Donaldson, M.D.  Vilinda Boehringer, M.D.  Merton Border, M.D  08/14/2015

## 2015-08-14 NOTE — Telephone Encounter (Signed)
Pt informed of results of CT. Nothing further needed.

## 2015-08-14 NOTE — Telephone Encounter (Signed)
-----   Message from Vilinda Boehringer, MD sent at 08/13/2015 11:32 PM EDT ----- Regarding: CT Results Please inform patient that I have reviewed her Chest CT, no new lesions, the RUL lesion have remained the same size since 2015.  Further details can be discussed at follow up visit.   Thank you

## 2015-08-18 ENCOUNTER — Ambulatory Visit: Payer: Medicare Other | Admitting: Internal Medicine

## 2015-11-07 ENCOUNTER — Emergency Department
Admission: EM | Admit: 2015-11-07 | Discharge: 2015-11-07 | Disposition: A | Discharge status: Home / Self Care | Payer: Medicare Other | Attending: Emergency Medicine | Admitting: Emergency Medicine

## 2015-11-07 ENCOUNTER — Encounter: Payer: Self-pay | Admitting: Emergency Medicine

## 2015-11-07 ENCOUNTER — Emergency Department: Payer: Medicare Other

## 2015-11-07 DIAGNOSIS — Z79899 Other long term (current) drug therapy: Secondary | ICD-10-CM | POA: Insufficient documentation

## 2015-11-07 DIAGNOSIS — R0602 Shortness of breath: Secondary | ICD-10-CM | POA: Diagnosis present

## 2015-11-07 DIAGNOSIS — J449 Chronic obstructive pulmonary disease, unspecified: Secondary | ICD-10-CM | POA: Insufficient documentation

## 2015-11-07 DIAGNOSIS — I2510 Atherosclerotic heart disease of native coronary artery without angina pectoris: Secondary | ICD-10-CM | POA: Diagnosis not present

## 2015-11-07 DIAGNOSIS — I5030 Unspecified diastolic (congestive) heart failure: Secondary | ICD-10-CM | POA: Insufficient documentation

## 2015-11-07 DIAGNOSIS — I110 Hypertensive heart disease with heart failure: Secondary | ICD-10-CM | POA: Insufficient documentation

## 2015-11-07 DIAGNOSIS — F17210 Nicotine dependence, cigarettes, uncomplicated: Secondary | ICD-10-CM | POA: Diagnosis not present

## 2015-11-07 DIAGNOSIS — Z5321 Procedure and treatment not carried out due to patient leaving prior to being seen by health care provider: Secondary | ICD-10-CM | POA: Insufficient documentation

## 2015-11-07 LAB — CBC WITH DIFFERENTIAL/PLATELET
BASOS ABS: 0.1 10*3/uL (ref 0–0.1)
Basophils Relative: 1 %
Eosinophils Absolute: 0.1 10*3/uL (ref 0–0.7)
Eosinophils Relative: 1 %
HEMATOCRIT: 40.3 % (ref 35.0–47.0)
HEMOGLOBIN: 14.1 g/dL (ref 12.0–16.0)
LYMPHS PCT: 41 %
Lymphs Abs: 4 10*3/uL — ABNORMAL HIGH (ref 1.0–3.6)
MCH: 33 pg (ref 26.0–34.0)
MCHC: 35 g/dL (ref 32.0–36.0)
MCV: 94.2 fL (ref 80.0–100.0)
MONO ABS: 0.6 10*3/uL (ref 0.2–0.9)
MONOS PCT: 6 %
NEUTROS ABS: 5.1 10*3/uL (ref 1.4–6.5)
NEUTROS PCT: 51 %
Platelets: 230 10*3/uL (ref 150–440)
RBC: 4.28 MIL/uL (ref 3.80–5.20)
RDW: 13.9 % (ref 11.5–14.5)
WBC: 9.9 10*3/uL (ref 3.6–11.0)

## 2015-11-07 LAB — COMPREHENSIVE METABOLIC PANEL
ALBUMIN: 3.7 g/dL (ref 3.5–5.0)
ALK PHOS: 66 U/L (ref 38–126)
ALT: 11 U/L — ABNORMAL LOW (ref 14–54)
AST: 16 U/L (ref 15–41)
Anion gap: 7 (ref 5–15)
BILIRUBIN TOTAL: 0.6 mg/dL (ref 0.3–1.2)
BUN: 10 mg/dL (ref 6–20)
CALCIUM: 9.4 mg/dL (ref 8.9–10.3)
CO2: 29 mmol/L (ref 22–32)
CREATININE: 0.57 mg/dL (ref 0.44–1.00)
Chloride: 104 mmol/L (ref 101–111)
GFR calc Af Amer: >60 mL/min (ref >60)
GLUCOSE: 104 mg/dL — AB (ref 65–99)
POTASSIUM: 4.5 mmol/L (ref 3.5–5.1)
Sodium: 140 mmol/L (ref 135–145)
TOTAL PROTEIN: 7.1 g/dL (ref 6.5–8.1)

## 2015-11-07 LAB — TROPONIN I

## 2015-11-07 NOTE — ED Triage Notes (Signed)
Reports sob worse than usual, pt wears 2L O2 at home for copd.  Skin w/d.

## 2015-12-27 ENCOUNTER — Emergency Department: Payer: Medicare Other

## 2015-12-27 ENCOUNTER — Emergency Department
Admission: EM | Admit: 2015-12-27 | Discharge: 2015-12-27 | Disposition: A | Discharge status: Home / Self Care | Payer: Medicare Other | Attending: Emergency Medicine | Admitting: Emergency Medicine

## 2015-12-27 ENCOUNTER — Encounter: Payer: Self-pay | Admitting: Emergency Medicine

## 2015-12-27 DIAGNOSIS — J441 Chronic obstructive pulmonary disease with (acute) exacerbation: Secondary | ICD-10-CM | POA: Insufficient documentation

## 2015-12-27 DIAGNOSIS — F17210 Nicotine dependence, cigarettes, uncomplicated: Secondary | ICD-10-CM | POA: Insufficient documentation

## 2015-12-27 DIAGNOSIS — I5030 Unspecified diastolic (congestive) heart failure: Secondary | ICD-10-CM | POA: Diagnosis not present

## 2015-12-27 DIAGNOSIS — I110 Hypertensive heart disease with heart failure: Secondary | ICD-10-CM | POA: Diagnosis not present

## 2015-12-27 DIAGNOSIS — Z79899 Other long term (current) drug therapy: Secondary | ICD-10-CM | POA: Diagnosis not present

## 2015-12-27 DIAGNOSIS — R0602 Shortness of breath: Secondary | ICD-10-CM | POA: Diagnosis present

## 2015-12-27 DIAGNOSIS — I2510 Atherosclerotic heart disease of native coronary artery without angina pectoris: Secondary | ICD-10-CM | POA: Diagnosis not present

## 2015-12-27 LAB — BASIC METABOLIC PANEL
Anion gap: 7 (ref 5–15)
BUN: 13 mg/dL (ref 6–20)
CALCIUM: 9 mg/dL (ref 8.9–10.3)
CO2: 28 mmol/L (ref 22–32)
CREATININE: 0.68 mg/dL (ref 0.44–1.00)
Chloride: 104 mmol/L (ref 101–111)
Glucose, Bld: 124 mg/dL — ABNORMAL HIGH (ref 65–99)
Potassium: 3.5 mmol/L (ref 3.5–5.1)
SODIUM: 139 mmol/L (ref 135–145)

## 2015-12-27 LAB — CBC WITH DIFFERENTIAL/PLATELET
BASOS PCT: 1 %
Basophils Absolute: 0.1 10*3/uL (ref 0–0.1)
EOS ABS: 0.1 10*3/uL (ref 0–0.7)
Eosinophils Relative: 1 %
HCT: 40.9 % (ref 35.0–47.0)
HEMOGLOBIN: 13.7 g/dL (ref 12.0–16.0)
LYMPHS PCT: 38 %
Lymphs Abs: 5.8 10*3/uL — ABNORMAL HIGH (ref 1.0–3.6)
MCH: 32.9 pg (ref 26.0–34.0)
MCHC: 33.6 g/dL (ref 32.0–36.0)
MCV: 97.9 fL (ref 80.0–100.0)
Monocytes Absolute: 0.9 10*3/uL (ref 0.2–0.9)
Monocytes Relative: 6 %
NEUTROS ABS: 8.3 10*3/uL — AB (ref 1.4–6.5)
Neutrophils Relative %: 54 %
PLATELETS: 247 10*3/uL (ref 150–440)
RBC: 4.18 MIL/uL (ref 3.80–5.20)
RDW: 13.5 % (ref 11.5–14.5)
WBC: 15.2 10*3/uL — AB (ref 3.6–11.0)

## 2015-12-27 LAB — TROPONIN I

## 2015-12-27 LAB — BRAIN NATRIURETIC PEPTIDE: B NATRIURETIC PEPTIDE 5: 43 pg/mL (ref 0.0–100.0)

## 2015-12-27 MED ORDER — AZITHROMYCIN 250 MG PO TABS: ORAL_TABLET | ORAL | 0 refills | Status: DC

## 2015-12-27 MED ORDER — PREDNISONE 10 MG (21) PO TBPK: ORAL_TABLET | ORAL | 0 refills | Status: AC

## 2015-12-27 NOTE — ED Provider Notes (Signed)
[facility] Emergency Department Provider Note        Time seen: ----------------------------------------- 2:08 AM on 12/27/2015 -----------------------------------------    I have reviewed the triage vital signs and the nursing notes.   HISTORY  Chief Complaint Shortness of Breath    HPI Dawn Donaldson is a 65 y.o. female who presents to ER for difficulty breathing. She arrives from home via EMS after she woke up short of breath. Patient used her inhaler at home without relief. She had taken her oxygen off at the time. She typically uses 2 L of nasal cannula oxygen at night while she sleeps. She received 3 DuoNeb since and Solu-Medrol prior to arrival with improvement in her symptoms. She denies any recent illness or other complaints. Currently she is feeling somewhat better.   Past Medical History:  Diagnosis Date  . Adenomatous colon polyp   . Anginal pain ([place])   . CAD (coronary artery disease)    s/p PCI  . Chronic low back pain   . Chronic lower back pain   . Colon polyps   . COPD (chronic obstructive pulmonary disease) ([place])    on nocturnal home o2  . CVA (cerebral infarction)   . Diastolic CHF ([place])   . DJD (degenerative joint disease)   . DJD (degenerative joint disease)   . GERD (gastroesophageal reflux disease)   . History of hiatal hernia   . Hypercholesteremia   . Hyperlipidemia   . Hypertension   . Hypoxemia   . Obesity   . Osteoporosis   . Peripheral vascular disease ([place])   . Stroke [facility])     Patient Active Problem List   Diagnosis Date Noted  . Abnormal CT scan of lung 07/21/2015  . Pulmonary nodules 07/21/2015  . Tobacco abuse counseling 07/21/2015  . Acute on chronic respiratory failure ([place]) 04/29/2015  . COPD exacerbation ([place]) 12/28/2014  . COPD (chronic obstructive pulmonary disease) ([place]) 05/23/2014    Past Surgical History:  Procedure Laterality Date  . ABDOMINAL HYSTERECTOMY    . APPENDECTOMY    .  BACK SURGERY  2002,2003  . CARDIAC CATHETERIZATION Left 06/20/2015   Procedure: Left Heart Cath and Coronary Angiography;  Surgeon: Yolonda Kida, MD;  Location: [facility];  Service: Cardiovascular;  Laterality: Left;  . CAROTID ENDARTERECTOMY  2009   Right  . CHOLECYSTECTOMY    . CORONARY ANGIOPLASTY  2008   with stent  . EYE SURGERY    . IMPLANTATION BONE ANCHORED HEARING AID  2010  . PARTIAL HYSTERECTOMY    . VEIN LIGATION AND STRIPPING      Allergies Dexlansoprazole and Ultram [tramadol]  Social History Social History  Substance Use Topics  . Smoking status: Current Some Day Smoker    Packs/day: 0.10    Years: 40.00    Types: Cigarettes    Last attempt to quit: 03/20/2015  . Smokeless tobacco: Never Used     Comment: a pack can last 3-66mo  . Alcohol use No    Review of Systems Constitutional: Negative for fever. Cardiovascular: Negative for chest pain. Respiratory: Positive shortness of breath Gastrointestinal: Negative for abdominal pain, vomiting and diarrhea. Genitourinary: Negative for dysuria. Musculoskeletal: Negative for back pain. Skin: Negative for rash. Neurological: Negative for headaches, focal weakness or numbness.  10-point ROS otherwise negative.  ____________________________________________   PHYSICAL EXAM:  VITAL SIGNS: ED Triage Vitals  Enc Vitals Group     BP 12/27/15 0204 (!) 159/106  Pulse Rate 12/27/15 0204 91     Resp 12/27/15 0204 (!) 22     Temp 12/27/15 0204 98.2 F (36.8 C)     Temp Source 12/27/15 0204 Oral     SpO2 12/27/15 0159 100 %     Weight 12/27/15 0205 166 lb (75.3 kg)     Height 12/27/15 0205 5\' 1"  (1.549 m)     Head Circumference --      Peak Flow --      Pain Score 12/27/15 0207 5     Pain Loc --      Pain Edu? --      Excl. in [place]? --     Constitutional: Alert and oriented. Anxious, mild distress Eyes: Conjunctivae are normal. PERRL. Normal extraocular movements. ENT   Head:  Normocephalic and atraumatic.   Nose: No congestion/rhinnorhea.   Mouth/Throat: Mucous membranes are moist.   Neck: No stridor. Cardiovascular: Normal rate, regular rhythm. No murmurs, rubs, or gallops. Respiratory: Mild tachypnea with bilateral wheezing Gastrointestinal: Soft and nontender. Normal bowel sounds Musculoskeletal: Nontender with normal range of motion in all extremities. No lower extremity tenderness nor edema. Neurologic:  Normal speech and language. No gross focal neurologic deficits are appreciated.  Skin:  Skin is warm, dry and intact. No rash noted. Psychiatric: Mood and affect are normal. Speech and behavior are normal.  ____________________________________________  EKG: Interpreted by me. Sinus rhythm rate of 92 bpm, normal PR interval, normal QRS, normal QT.  ____________________________________________  ED COURSE:  Pertinent labs & imaging results that were available during my care of the patient were reviewed by me and considered in my medical decision making (see chart for details). Clinical Course   Patient presents to ER in mild distress, somewhat improved from prior. We will assess with basic labs and imaging.  Procedures ____________________________________________   LABS (pertinent positives/negatives)  Labs Reviewed  CBC WITH DIFFERENTIAL/PLATELET - Abnormal; Notable for the following:       Result Value   WBC 15.2 (*)    Neutro Abs 8.3 (*)    Lymphs Abs 5.8 (*)    All other components within normal limits  BASIC METABOLIC PANEL - Abnormal; Notable for the following:    Glucose, Bld 124 (*)    All other components within normal limits  BLOOD GAS, VENOUS - Abnormal; Notable for the following:    pO2, Ven <31.0 (*)    Bicarbonate 28.5 (*)    Acid-Base Excess 3.4 (*)    All other components within normal limits  BRAIN NATRIURETIC PEPTIDE  TROPONIN I    RADIOLOGY  Chest x-ray Is  unremarkable ____________________________________________  FINAL ASSESSMENT AND PLAN  COPD exacerbation  Plan: Patient with labs and imaging as dictated above. Patient is in no distress, vital signs and labs are unremarkable. This is likely a combination of COPD, being off her oxygen and anxiety. She'll be discharged with steroids and antibiotics and will be encouraged to have close follow-up with her doctor   Earleen Newport, MD   Note: This dictation was prepared with Dragon dictation. Any transcriptional errors that result from this process are unintentional    Earleen Newport, MD 12/27/15 [phone]

## 2015-12-27 NOTE — ED Notes (Signed)
Pt having portable chest xray performed at this time.

## 2015-12-27 NOTE — ED Triage Notes (Addendum)
Pt presents to from home via [facility] to ED with c/o difficulty breathing, patient reports woke up with difficulty breathing, used inhaler at home without relief. Per EMS pt received 3 DUO NED and 125 mg of Solu-Medrol on scene. Pt denies chest pain, abdominal pain, or n/v/d. Pt alert and oriented x 4. Respirations even and labored, speaking in complete sentences.   Pt reports she is on 2 L nasal cannula at home, states she got up this morning to use the bathroom and did not place oxygen back on.

## 2015-12-28 LAB — BLOOD GAS, VENOUS
Acid-Base Excess: 3.4 mmol/L — ABNORMAL HIGH (ref 0.0–2.0)
Bicarbonate: 28.5 mmol/L — ABNORMAL HIGH (ref 20.0–28.0)
PATIENT TEMPERATURE: 37
pCO2, Ven: 44 mmHg (ref 44.0–60.0)
pH, Ven: 7.42 (ref 7.250–7.430)

## 2016-01-18 ENCOUNTER — Emergency Department: Payer: Medicare Other

## 2016-01-18 ENCOUNTER — Encounter: Payer: Self-pay | Admitting: Emergency Medicine

## 2016-01-18 ENCOUNTER — Emergency Department
Admission: EM | Admit: 2016-01-18 | Discharge: 2016-01-18 | Disposition: A | Discharge status: Home / Self Care | Payer: Medicare Other | Source: Home / Self Care

## 2016-01-18 DIAGNOSIS — R0602 Shortness of breath: Secondary | ICD-10-CM | POA: Insufficient documentation

## 2016-01-18 DIAGNOSIS — I110 Hypertensive heart disease with heart failure: Secondary | ICD-10-CM | POA: Insufficient documentation

## 2016-01-18 DIAGNOSIS — I2510 Atherosclerotic heart disease of native coronary artery without angina pectoris: Secondary | ICD-10-CM | POA: Insufficient documentation

## 2016-01-18 DIAGNOSIS — Z5321 Procedure and treatment not carried out due to patient leaving prior to being seen by health care provider: Secondary | ICD-10-CM

## 2016-01-18 DIAGNOSIS — J449 Chronic obstructive pulmonary disease, unspecified: Secondary | ICD-10-CM | POA: Insufficient documentation

## 2016-01-18 DIAGNOSIS — Z79899 Other long term (current) drug therapy: Secondary | ICD-10-CM

## 2016-01-18 DIAGNOSIS — R05 Cough: Secondary | ICD-10-CM

## 2016-01-18 DIAGNOSIS — I5032 Chronic diastolic (congestive) heart failure: Secondary | ICD-10-CM

## 2016-01-18 LAB — CBC
HCT: 40.9 % (ref 35.0–47.0)
HEMOGLOBIN: 14 g/dL (ref 12.0–16.0)
MCH: 33.5 pg (ref 26.0–34.0)
MCHC: 34.2 g/dL (ref 32.0–36.0)
MCV: 97.7 fL (ref 80.0–100.0)
Platelets: 249 10*3/uL (ref 150–440)
RBC: 4.18 MIL/uL (ref 3.80–5.20)
RDW: 13.6 % (ref 11.5–14.5)
WBC: 10.7 10*3/uL (ref 3.6–11.0)

## 2016-01-18 LAB — TROPONIN I

## 2016-01-18 LAB — BASIC METABOLIC PANEL
Anion gap: 7 (ref 5–15)
BUN: 19 mg/dL (ref 6–20)
CALCIUM: 9 mg/dL (ref 8.9–10.3)
CO2: 26 mmol/L (ref 22–32)
Chloride: 107 mmol/L (ref 101–111)
Creatinine, Ser: 0.74 mg/dL (ref 0.44–1.00)
GFR calc Af Amer: >60 mL/min (ref >60)
GLUCOSE: 101 mg/dL — AB (ref 65–99)
Potassium: 4 mmol/L (ref 3.5–5.1)
Sodium: 140 mmol/L (ref 135–145)

## 2016-01-18 MED ORDER — ALBUTEROL SULFATE (2.5 MG/3ML) 0.083% IN NEBU
5.0000 mg | INHALATION_SOLUTION | Freq: Once | RESPIRATORY_TRACT | Status: AC
  Administered 2016-01-18: 5 mg via RESPIRATORY_TRACT

## 2016-01-18 MED ORDER — ALBUTEROL SULFATE (2.5 MG/3ML) 0.083% IN NEBU
INHALATION_SOLUTION | RESPIRATORY_TRACT | Status: AC
  Administered 2016-01-18: 5 mg via RESPIRATORY_TRACT
  Filled 2016-01-18: qty 6

## 2016-01-18 NOTE — ED Triage Notes (Signed)
Pt comes into the ED via POV c/o shortness of breath that has been going on for a couple of days.  Patient has COPD and has been using neb treatments at home regularly with minimal relief.  Patient states she has a cough that is non productive.  Patient normally wears O2 at night but has been using 2 L during the day for relief.  Patient labored upon assessment.

## 2016-01-19 ENCOUNTER — Inpatient Hospital Stay: Payer: Medicare Other

## 2016-01-19 ENCOUNTER — Emergency Department: Payer: Medicare Other

## 2016-01-19 ENCOUNTER — Inpatient Hospital Stay
Admission: EM | Admit: 2016-01-19 | Discharge: 2016-02-05 | DRG: 207 | Disposition: E | Payer: Medicare Other | Attending: Internal Medicine | Admitting: Internal Medicine

## 2016-01-19 DIAGNOSIS — Z8673 Personal history of transient ischemic attack (TIA), and cerebral infarction without residual deficits: Secondary | ICD-10-CM | POA: Diagnosis not present

## 2016-01-19 DIAGNOSIS — R739 Hyperglycemia, unspecified: Secondary | ICD-10-CM | POA: Diagnosis not present

## 2016-01-19 DIAGNOSIS — J939 Pneumothorax, unspecified: Secondary | ICD-10-CM

## 2016-01-19 DIAGNOSIS — Z66 Do not resuscitate: Secondary | ICD-10-CM | POA: Diagnosis not present

## 2016-01-19 DIAGNOSIS — I469 Cardiac arrest, cause unspecified: Secondary | ICD-10-CM | POA: Diagnosis present

## 2016-01-19 DIAGNOSIS — F17210 Nicotine dependence, cigarettes, uncomplicated: Secondary | ICD-10-CM | POA: Diagnosis present

## 2016-01-19 DIAGNOSIS — I110 Hypertensive heart disease with heart failure: Secondary | ICD-10-CM | POA: Diagnosis present

## 2016-01-19 DIAGNOSIS — I5032 Chronic diastolic (congestive) heart failure: Secondary | ICD-10-CM | POA: Diagnosis present

## 2016-01-19 DIAGNOSIS — G8929 Other chronic pain: Secondary | ICD-10-CM | POA: Diagnosis present

## 2016-01-19 DIAGNOSIS — Z7952 Long term (current) use of systemic steroids: Secondary | ICD-10-CM

## 2016-01-19 DIAGNOSIS — R579 Shock, unspecified: Secondary | ICD-10-CM | POA: Diagnosis present

## 2016-01-19 DIAGNOSIS — J9602 Acute respiratory failure with hypercapnia: Secondary | ICD-10-CM | POA: Diagnosis not present

## 2016-01-19 DIAGNOSIS — G931 Anoxic brain damage, not elsewhere classified: Secondary | ICD-10-CM | POA: Diagnosis present

## 2016-01-19 DIAGNOSIS — I468 Cardiac arrest due to other underlying condition: Secondary | ICD-10-CM | POA: Diagnosis present

## 2016-01-19 DIAGNOSIS — T797XXA Traumatic subcutaneous emphysema, initial encounter: Secondary | ICD-10-CM | POA: Diagnosis present

## 2016-01-19 DIAGNOSIS — R0600 Dyspnea, unspecified: Secondary | ICD-10-CM

## 2016-01-19 DIAGNOSIS — I2510 Atherosclerotic heart disease of native coronary artery without angina pectoris: Secondary | ICD-10-CM | POA: Diagnosis present

## 2016-01-19 DIAGNOSIS — Z8249 Family history of ischemic heart disease and other diseases of the circulatory system: Secondary | ICD-10-CM

## 2016-01-19 DIAGNOSIS — I959 Hypotension, unspecified: Secondary | ICD-10-CM

## 2016-01-19 DIAGNOSIS — M545 Low back pain: Secondary | ICD-10-CM | POA: Diagnosis present

## 2016-01-19 DIAGNOSIS — Z9689 Presence of other specified functional implants: Secondary | ICD-10-CM

## 2016-01-19 DIAGNOSIS — M810 Age-related osteoporosis without current pathological fracture: Secondary | ICD-10-CM | POA: Diagnosis present

## 2016-01-19 DIAGNOSIS — N179 Acute kidney failure, unspecified: Secondary | ICD-10-CM | POA: Diagnosis present

## 2016-01-19 DIAGNOSIS — J9600 Acute respiratory failure, unspecified whether with hypoxia or hypercapnia: Secondary | ICD-10-CM

## 2016-01-19 DIAGNOSIS — E876 Hypokalemia: Secondary | ICD-10-CM | POA: Diagnosis present

## 2016-01-19 DIAGNOSIS — G9341 Metabolic encephalopathy: Secondary | ICD-10-CM | POA: Diagnosis not present

## 2016-01-19 DIAGNOSIS — J209 Acute bronchitis, unspecified: Secondary | ICD-10-CM | POA: Diagnosis present

## 2016-01-19 DIAGNOSIS — S2242XA Multiple fractures of ribs, left side, initial encounter for closed fracture: Secondary | ICD-10-CM | POA: Diagnosis present

## 2016-01-19 DIAGNOSIS — I4510 Unspecified right bundle-branch block: Secondary | ICD-10-CM | POA: Diagnosis present

## 2016-01-19 DIAGNOSIS — I700 Atherosclerosis of aorta: Secondary | ICD-10-CM | POA: Diagnosis present

## 2016-01-19 DIAGNOSIS — Z515 Encounter for palliative care: Secondary | ICD-10-CM | POA: Diagnosis not present

## 2016-01-19 DIAGNOSIS — Z6830 Body mass index (BMI) 30.0-30.9, adult: Secondary | ICD-10-CM

## 2016-01-19 DIAGNOSIS — G9340 Encephalopathy, unspecified: Secondary | ICD-10-CM

## 2016-01-19 DIAGNOSIS — Z7902 Long term (current) use of antithrombotics/antiplatelets: Secondary | ICD-10-CM

## 2016-01-19 DIAGNOSIS — D649 Anemia, unspecified: Secondary | ICD-10-CM | POA: Diagnosis present

## 2016-01-19 DIAGNOSIS — M1990 Unspecified osteoarthritis, unspecified site: Secondary | ICD-10-CM | POA: Diagnosis present

## 2016-01-19 DIAGNOSIS — J9601 Acute respiratory failure with hypoxia: Secondary | ICD-10-CM | POA: Diagnosis not present

## 2016-01-19 DIAGNOSIS — Z9861 Coronary angioplasty status: Secondary | ICD-10-CM

## 2016-01-19 DIAGNOSIS — K219 Gastro-esophageal reflux disease without esophagitis: Secondary | ICD-10-CM | POA: Diagnosis present

## 2016-01-19 DIAGNOSIS — I739 Peripheral vascular disease, unspecified: Secondary | ICD-10-CM | POA: Diagnosis present

## 2016-01-19 DIAGNOSIS — J9622 Acute and chronic respiratory failure with hypercapnia: Secondary | ICD-10-CM

## 2016-01-19 DIAGNOSIS — S270XXA Traumatic pneumothorax, initial encounter: Secondary | ICD-10-CM | POA: Diagnosis present

## 2016-01-19 DIAGNOSIS — J440 Chronic obstructive pulmonary disease with acute lower respiratory infection: Secondary | ICD-10-CM | POA: Diagnosis present

## 2016-01-19 DIAGNOSIS — J9621 Acute and chronic respiratory failure with hypoxia: Secondary | ICD-10-CM

## 2016-01-19 DIAGNOSIS — E872 Acidosis: Secondary | ICD-10-CM | POA: Diagnosis present

## 2016-01-19 DIAGNOSIS — J1100 Influenza due to unidentified influenza virus with unspecified type of pneumonia: Secondary | ICD-10-CM | POA: Diagnosis not present

## 2016-01-19 DIAGNOSIS — E7800 Pure hypercholesterolemia, unspecified: Secondary | ICD-10-CM | POA: Diagnosis present

## 2016-01-19 DIAGNOSIS — H5702 Anisocoria: Secondary | ICD-10-CM

## 2016-01-19 DIAGNOSIS — J441 Chronic obstructive pulmonary disease with (acute) exacerbation: Secondary | ICD-10-CM | POA: Diagnosis present

## 2016-01-19 DIAGNOSIS — E874 Mixed disorder of acid-base balance: Secondary | ICD-10-CM | POA: Diagnosis present

## 2016-01-19 DIAGNOSIS — E162 Hypoglycemia, unspecified: Secondary | ICD-10-CM | POA: Diagnosis not present

## 2016-01-19 DIAGNOSIS — Z86010 Personal history of colonic polyps: Secondary | ICD-10-CM

## 2016-01-19 DIAGNOSIS — E669 Obesity, unspecified: Secondary | ICD-10-CM | POA: Diagnosis present

## 2016-01-19 DIAGNOSIS — K668 Other specified disorders of peritoneum: Secondary | ICD-10-CM

## 2016-01-19 LAB — BLOOD GAS, ARTERIAL
Acid-base deficit: 14.8 mmol/L — ABNORMAL HIGH (ref 0.0–2.0)
Acid-base deficit: 2.2 mmol/L — ABNORMAL HIGH (ref 0.0–2.0)
BICARBONATE: 20.6 mmol/L (ref 20.0–28.0)
BICARBONATE: 26.1 mmol/L (ref 20.0–28.0)
FIO2: 0.5
FIO2: 50
LHR: 16 {breaths}/min
MECHANICAL RATE: 22
MECHVT: 500 mL
O2 SAT: 99.7 %
O2 Saturation: 98.5 %
PEEP/CPAP: 7 cmH2O
PEEP: 5 cmH2O
PH ART: 7.24 — AB (ref 7.350–7.450)
PRESSURE CONTROL: 45 cmH2O
Patient temperature: 37
Patient temperature: 37
pCO2 arterial: 105 mmHg (ref 32.0–48.0)
pCO2 arterial: 61 mmHg — ABNORMAL HIGH (ref 32.0–48.0)
pH, Arterial: 6.9 — CL (ref 7.350–7.450)
pO2, Arterial: 173 mmHg — ABNORMAL HIGH (ref 83.0–108.0)
pO2, Arterial: 235 mmHg — ABNORMAL HIGH (ref 83.0–108.0)

## 2016-01-19 LAB — CBC
HCT: 44.7 % (ref 35.0–47.0)
Hemoglobin: 14.1 g/dL (ref 12.0–16.0)
MCH: 32.8 pg (ref 26.0–34.0)
MCHC: 31.5 g/dL — ABNORMAL LOW (ref 32.0–36.0)
MCV: 104.1 fL — ABNORMAL HIGH (ref 80.0–100.0)
PLATELETS: 241 10*3/uL (ref 150–440)
RBC: 4.29 MIL/uL (ref 3.80–5.20)
RDW: 14.9 % — AB (ref 11.5–14.5)
WBC: 16.2 10*3/uL — AB (ref 3.6–11.0)

## 2016-01-19 LAB — LACTIC ACID, PLASMA
LACTIC ACID, VENOUS: 8.8 mmol/L — AB (ref 0.5–1.9)
Lactic Acid, Venous: 2.6 mmol/L (ref 0.5–1.9)

## 2016-01-19 LAB — URINALYSIS, COMPLETE (UACMP) WITH MICROSCOPIC
BACTERIA UA: NONE SEEN
Bilirubin Urine: NEGATIVE
Glucose, UA: 50 mg/dL — AB
Ketones, ur: NEGATIVE mg/dL
Leukocytes, UA: NEGATIVE
Nitrite: NEGATIVE
SPECIFIC GRAVITY, URINE: 1.013 (ref 1.005–1.030)
SQUAMOUS EPITHELIAL / LPF: NONE SEEN
pH: 5 (ref 5.0–8.0)

## 2016-01-19 LAB — BASIC METABOLIC PANEL
ANION GAP: 14 (ref 5–15)
BUN: 16 mg/dL (ref 6–20)
CALCIUM: 9.1 mg/dL (ref 8.9–10.3)
CO2: 21 mmol/L — ABNORMAL LOW (ref 22–32)
Chloride: 104 mmol/L (ref 101–111)
Creatinine, Ser: 0.87 mg/dL (ref 0.44–1.00)
Glucose, Bld: 234 mg/dL — ABNORMAL HIGH (ref 65–99)
Potassium: 4.6 mmol/L (ref 3.5–5.1)
Sodium: 139 mmol/L (ref 135–145)

## 2016-01-19 LAB — MRSA PCR SCREENING: MRSA BY PCR: NEGATIVE

## 2016-01-19 LAB — TROPONIN I: Troponin I: 0.04 ng/mL (ref <0.03)

## 2016-01-19 LAB — PROTIME-INR
INR: 1.36
PROTHROMBIN TIME: 16.9 s — AB (ref 11.4–15.2)

## 2016-01-19 LAB — GLUCOSE, CAPILLARY
GLUCOSE-CAPILLARY: 250 mg/dL — AB (ref 65–99)
Glucose-Capillary: 215 mg/dL — ABNORMAL HIGH (ref 65–99)

## 2016-01-19 LAB — INFLUENZA PANEL BY PCR (TYPE A & B)
INFLAPCR: NEGATIVE
Influenza B By PCR: NEGATIVE

## 2016-01-19 LAB — APTT: APTT: 40 s — AB (ref 24–36)

## 2016-01-19 MED ORDER — FENTANYL CITRATE (PF) 100 MCG/2ML IJ SOLN
100.0000 ug | INTRAMUSCULAR | Status: DC | PRN
  Administered 2016-01-20 – 2016-01-26 (×10): 100 ug via INTRAVENOUS
  Filled 2016-01-19 (×13): qty 2

## 2016-01-19 MED ORDER — INSULIN ASPART 100 UNIT/ML ~~LOC~~ SOLN
0.0000 [IU] | SUBCUTANEOUS | Status: DC
  Administered 2016-01-19: 5 [IU] via SUBCUTANEOUS
  Administered 2016-01-20: 8 [IU] via SUBCUTANEOUS
  Administered 2016-01-20: 2 [IU] via SUBCUTANEOUS
  Administered 2016-01-20: 8 [IU] via SUBCUTANEOUS
  Administered 2016-01-20 (×2): 2 [IU] via SUBCUTANEOUS
  Administered 2016-01-21: 3 [IU] via SUBCUTANEOUS
  Administered 2016-01-22 (×4): 2 [IU] via SUBCUTANEOUS
  Administered 2016-01-22: 3 [IU] via SUBCUTANEOUS
  Administered 2016-01-22 – 2016-01-23 (×2): 2 [IU] via SUBCUTANEOUS
  Administered 2016-01-23: 3 [IU] via SUBCUTANEOUS
  Administered 2016-01-23 – 2016-01-24 (×2): 2 [IU] via SUBCUTANEOUS
  Administered 2016-01-24: 3 [IU] via SUBCUTANEOUS
  Administered 2016-01-24: 2 [IU] via SUBCUTANEOUS
  Administered 2016-01-24 (×2): 3 [IU] via SUBCUTANEOUS
  Administered 2016-01-24 – 2016-01-25 (×2): 2 [IU] via SUBCUTANEOUS
  Administered 2016-01-25 (×3): 3 [IU] via SUBCUTANEOUS
  Administered 2016-01-26 (×2): 2 [IU] via SUBCUTANEOUS
  Filled 2016-01-19: qty 8
  Filled 2016-01-19: qty 3
  Filled 2016-01-19: qty 8
  Filled 2016-01-19 (×5): qty 2
  Filled 2016-01-19: qty 3
  Filled 2016-01-19: qty 2
  Filled 2016-01-19 (×2): qty 3
  Filled 2016-01-19 (×8): qty 2
  Filled 2016-01-19: qty 3
  Filled 2016-01-19 (×2): qty 2
  Filled 2016-01-19 (×4): qty 3
  Filled 2016-01-19: qty 5
  Filled 2016-01-19: qty 2

## 2016-01-19 MED ORDER — EPINEPHRINE PF 1 MG/10ML IJ SOSY
PREFILLED_SYRINGE | INTRAMUSCULAR | Status: AC | PRN
  Administered 2016-01-19 (×2): 1 via INTRAVENOUS

## 2016-01-19 MED ORDER — ASPIRIN 81 MG PO CHEW: 324.0000 mg | CHEWABLE_TABLET | ORAL | Status: AC

## 2016-01-19 MED ORDER — SODIUM CHLORIDE 0.9 % IV SOLN: 250.0000 mL | INTRAVENOUS | Status: DC | PRN

## 2016-01-19 MED ORDER — NICARDIPINE HCL IN NACL 20-0.86 MG/200ML-% IV SOLN
3.0000 mg/h | Freq: Once | INTRAVENOUS | Status: DC
  Filled 2016-01-19: qty 200

## 2016-01-19 MED ORDER — NITROGLYCERIN 2 % TD OINT
1.0000 [in_us] | TOPICAL_OINTMENT | Freq: Once | TRANSDERMAL | Status: AC
  Administered 2016-01-19: 1 [in_us] via TOPICAL

## 2016-01-19 MED ORDER — DEXTROSE 5 % IV SOLN
INTRAVENOUS | Status: DC
  Administered 2016-01-19 – 2016-01-20 (×3): via INTRAVENOUS
  Filled 2016-01-19 (×6): qty 150

## 2016-01-19 MED ORDER — SODIUM BICARBONATE 8.4 % IV SOLN
INTRAVENOUS | Status: AC | PRN
  Administered 2016-01-19 (×2): 50 meq via INTRAVENOUS

## 2016-01-19 MED ORDER — SODIUM CHLORIDE 0.9 % IV BOLUS (SEPSIS)
1500.0000 mL | Freq: Once | INTRAVENOUS | Status: AC
  Administered 2016-01-19: 1000 mL via INTRAVENOUS

## 2016-01-19 MED ORDER — FENTANYL BOLUS VIA INFUSION
50.0000 ug | INTRAVENOUS | Status: DC | PRN
  Administered 2016-01-20 (×2): 50 ug via INTRAVENOUS
  Filled 2016-01-19: qty 50

## 2016-01-19 MED ORDER — ENOXAPARIN SODIUM 40 MG/0.4ML ~~LOC~~ SOLN
40.0000 mg | SUBCUTANEOUS | Status: DC
  Administered 2016-01-19 – 2016-01-25 (×7): 40 mg via SUBCUTANEOUS
  Filled 2016-01-19 (×7): qty 0.4

## 2016-01-19 MED ORDER — PROPOFOL 1000 MG/100ML IV EMUL
INTRAVENOUS | Status: AC
  Administered 2016-01-19: 5.025 ug/kg/min via INTRAVENOUS
  Filled 2016-01-19: qty 100

## 2016-01-19 MED ORDER — MIDAZOLAM HCL 2 MG/2ML IJ SOLN
2.0000 mg | INTRAMUSCULAR | Status: DC | PRN
  Administered 2016-01-19: 2 mg via INTRAVENOUS

## 2016-01-19 MED ORDER — MIDAZOLAM HCL 2 MG/2ML IJ SOLN
2.0000 mg | INTRAMUSCULAR | Status: AC | PRN
  Administered 2016-01-20 (×3): 2 mg via INTRAVENOUS
  Filled 2016-01-19 (×4): qty 2

## 2016-01-19 MED ORDER — ORAL CARE MOUTH RINSE
15.0000 mL | OROMUCOSAL | Status: DC
  Administered 2016-01-19 – 2016-01-26 (×66): 15 mL via OROMUCOSAL

## 2016-01-19 MED ORDER — VANCOMYCIN HCL IN DEXTROSE 1-5 GM/200ML-% IV SOLN
1000.0000 mg | Freq: Once | INTRAVENOUS | Status: AC
  Administered 2016-01-19: 1000 mg via INTRAVENOUS
  Filled 2016-01-19: qty 200

## 2016-01-19 MED ORDER — PROPOFOL 1000 MG/100ML IV EMUL
5.0000 ug/kg/min | Freq: Once | INTRAVENOUS | Status: AC
  Administered 2016-01-19: 5.025 ug/kg/min via INTRAVENOUS

## 2016-01-19 MED ORDER — SODIUM CHLORIDE 0.9% FLUSH: 10.0000 mL | INTRAVENOUS | Status: DC | PRN

## 2016-01-19 MED ORDER — PIPERACILLIN-TAZOBACTAM 3.375 G IVPB 30 MIN
3.3750 g | Freq: Once | INTRAVENOUS | Status: AC
  Administered 2016-01-19: 3.375 g via INTRAVENOUS
  Filled 2016-01-19: qty 50

## 2016-01-19 MED ORDER — FAMOTIDINE IN NACL 20-0.9 MG/50ML-% IV SOLN
20.0000 mg | Freq: Two times a day (BID) | INTRAVENOUS | Status: DC
  Administered 2016-01-19 – 2016-01-21 (×4): 20 mg via INTRAVENOUS
  Filled 2016-01-19 (×5): qty 50

## 2016-01-19 MED ORDER — SODIUM CHLORIDE 0.9 % IV SOLN
INTRAVENOUS | Status: DC
  Administered 2016-01-19 – 2016-01-22 (×6): via INTRAVENOUS

## 2016-01-19 MED ORDER — CHLORHEXIDINE GLUCONATE 0.12% ORAL RINSE (MEDLINE KIT)
15.0000 mL | Freq: Two times a day (BID) | OROMUCOSAL | Status: DC
  Administered 2016-01-20 – 2016-01-26 (×13): 15 mL via OROMUCOSAL

## 2016-01-19 MED ORDER — VANCOMYCIN HCL IN DEXTROSE 750-5 MG/150ML-% IV SOLN
750.0000 mg | Freq: Two times a day (BID) | INTRAVENOUS | Status: DC
  Administered 2016-01-19: 750 mg via INTRAVENOUS
  Filled 2016-01-19 (×3): qty 150

## 2016-01-19 MED ORDER — NITROGLYCERIN 2 % TD OINT
TOPICAL_OINTMENT | TRANSDERMAL | Status: AC
  Administered 2016-01-19: 1 [in_us] via TOPICAL
  Filled 2016-01-19: qty 1

## 2016-01-19 MED ORDER — ASPIRIN 300 MG RE SUPP
300.0000 mg | RECTAL | Status: AC
  Administered 2016-01-19: 300 mg via RECTAL

## 2016-01-19 MED ORDER — FENTANYL CITRATE (PF) 100 MCG/2ML IJ SOLN
100.0000 ug | INTRAMUSCULAR | Status: AC | PRN
  Administered 2016-01-19 (×3): 100 ug via INTRAVENOUS
  Filled 2016-01-19 (×2): qty 2

## 2016-01-19 MED ORDER — SODIUM CHLORIDE 0.9% FLUSH
10.0000 mL | Freq: Two times a day (BID) | INTRAVENOUS | Status: DC
  Administered 2016-01-19 – 2016-01-26 (×12): 10 mL

## 2016-01-19 MED ORDER — FENTANYL CITRATE (PF) 100 MCG/2ML IJ SOLN
50.0000 ug | Freq: Once | INTRAMUSCULAR | Status: DC
  Filled 2016-01-19: qty 2

## 2016-01-19 MED ORDER — EPINEPHRINE PF 1 MG/ML IJ SOLN
0.5000 ug/min | INTRAVENOUS | Status: DC
  Administered 2016-01-19: 0.5 ug/min via INTRAVENOUS
  Filled 2016-01-19: qty 4

## 2016-01-19 MED ORDER — FENTANYL 2500MCG IN NS 250ML (10MCG/ML) PREMIX INFUSION
25.0000 ug/h | INTRAVENOUS | Status: AC
  Administered 2016-01-19 – 2016-01-20 (×2): 25 ug/h via INTRAVENOUS
  Filled 2016-01-19 (×2): qty 250

## 2016-01-19 MED ORDER — ASPIRIN 300 MG RE SUPP
300.0000 mg | RECTAL | Status: AC
  Administered 2016-01-19: 300 mg via RECTAL
  Filled 2016-01-19: qty 1

## 2016-01-19 MED ORDER — IPRATROPIUM-ALBUTEROL 0.5-2.5 (3) MG/3ML IN SOLN
3.0000 mL | RESPIRATORY_TRACT | Status: DC
  Administered 2016-01-19 – 2016-01-26 (×40): 3 mL via RESPIRATORY_TRACT
  Filled 2016-01-19 (×40): qty 3

## 2016-01-19 MED ORDER — PROPOFOL 1000 MG/100ML IV EMUL: 0.0000 ug/kg/min | INTRAVENOUS | Status: DC

## 2016-01-19 MED ORDER — OSELTAMIVIR PHOSPHATE 75 MG PO CAPS
75.0000 mg | ORAL_CAPSULE | Freq: Two times a day (BID) | ORAL | Status: DC
  Filled 2016-01-19: qty 1

## 2016-01-19 MED ORDER — PIPERACILLIN-TAZOBACTAM 3.375 G IVPB
3.3750 g | Freq: Three times a day (TID) | INTRAVENOUS | Status: AC
  Administered 2016-01-19 – 2016-01-25 (×18): 3.375 g via INTRAVENOUS
  Filled 2016-01-19 (×17): qty 50

## 2016-01-19 NOTE — Procedures (Signed)
PROCEDURE NOTE: THORACOSTOMY TUBE PLACEMENT    Date: 01/25/2016,  [id number] MRN# [id number] Susanah Krugman    Indication: Left Pneumothorax    A time-out was completed verifying correct patient, procedure, site, positioning, special catheter was available at the time of the procedure.  The patient was positioned appropriately for chest tube placement. The patient's  left chest was prepped and draped in sterile fashion at 8th interspace in mid axillary line. 1% Lidocaine was not used to anesthetize the surrounding skin area  A Wayne Ptx  thoracostomy tube was placed using a modified Seldinger technique to 15 cm at the skin.  The chest tube was sutured securely to the skin and a sterile dressing applied. A pleurevac was attached to the chest tube, continuous air leak was noted.  The patients tidal volume increased from 350 cc to 500 cc while on pressure control ventilation.

## 2016-01-19 NOTE — Progress Notes (Signed)
Transported pt to CT and then to ICU 11 on vent without incident. Pt remains on the same vent settings PC FIO2 50%, Peep 5, RR 22, PC 45.

## 2016-01-19 NOTE — Procedures (Signed)
Central Venous Catheter Placement: Indication: Patient receiving vesicant or irritant drug.; Patient receiving intravenous therapy for longer than 5 days.; Patient has limited or no vascular access.   Consent:emergent.   Risks and benefits explained in detail including risk of infection, bleeding, respiratory failure and death..   Hand washing performed prior to starting the procedure.   Procedure: An active timeout was performed and correct patient, name, & ID confirmed.  After explaining risk and benefits, patient was positioned correctly for central venous access. Patient was prepped using strict sterile technique including chlorohexadine preps, sterile drape, sterile gown and sterile gloves.  The area was prepped, draped and anesthetized in the usual sterile manner. Patient comfort was obtained.  A triple lumen catheter was placed in right femoral Vein There was good blood return, catheter caps were placed on lumens, catheter flushed easily, the line was secured and a sterile dressing and BIO-PATCH applied.   Ultrasound was used to visualize vasculature and guidance of needle.   Number of Attempts: 1 Complications:none Estimated Blood Loss: minimal  Operator: Marda Stalker, M.D.   Marda Stalker, M.D.  Pulmonary & Critical Care Medicine

## 2016-01-19 NOTE — Progress Notes (Signed)
Pt seen in ED; cardiac arrest, with continued instability and agonal respirations. Will not initiate cooling protocol at this time.  -Stat CT chest to r/o PTX.  -Bicarb drip -Epi drip, as pt became bradycardic as previous epi wore off.   Updated family.   Marda Stalker, M.D. 01/08/2016

## 2016-01-19 NOTE — H&P (Addendum)
[facility] Consultation     ASSESSMENT/PLAN   Acute cardiac arrest of uncertain etiology.   PULMONARY A:Acute hypoxic and hypercapnic resp failure.  P:   --Continue ventilator support.  --The patient is having considerable difficulty with obtaining volumes on ventilator, possibly secondary to AECOPD and/or PTX; CXR films review is suggestive of ptx, but presence of life support devices and COPD hinders view--will send for stat CT chest.   CARDIOVASCULAR A: Shock with cardiac arrest.  P:  Wills start epi drip.  --Pt still unstable, no cooling at this time.    RENAL A:  Metabolic and respiratory acidosis.  P:   Will start bicarb drip.   GASTROINTESTINAL A:  -- P:   Ranitidine.   HEMATOLOGIC A:  Leukocytosis, likely reactive.  P:  --  INFECTIOUS A:  Suspect viral bronchitis.  P:   Start empiric tamiflu.  Start empiric abx.  --Cultures ordered and pending.   Micro/culture results:  BCx2 -- UC -- Sputum--  Antibiotics: 1/15 Zosyn >> 1/15 Vancomycin >> 1/15 tamiflu >>  ENDOCRINE A: SSI.   NEUROLOGIC A:  Metabolic and anoxic encephalopathy.  --Monitor mental status, wake up assessment.    MAJOR EVENTS/TEST RESULTS:   Best Practices  DVT Prophylaxis: enoxaparin.  GI Prophylaxis: ranitidine.    ---------------------------------------  ---------------------------------------   Name: Dawn Donaldson MRN: [id number] DOB: 14-Mar-1950    ADMISSION DATE:  02/03/2016   CHIEF COMPLAINT:  Cardiac arrest   HISTORY OF PRESENT ILLNESS:    Pt is currently  Intubated on the ventilator, therefore all history obtained from chart, staff, family.  They note that the patient had been feeling ill for past 3-4 days, with dyspnea, cough, sputum production. She went to see her doctor with these symptoms. No other history is available at this time.  On initial assessment pt was noted to be agonal in respirations, hypertensive. Peak  pressures were elevated on the vent, she lost her pulse again, quickly going bradycardiac into asystole and then pea arrest. This last for another 3-5 min.  Pt then achieved ROSC., CXr showed possible left ptx. CT chest was obtained on stat basis without contrast, this showed left ptx, Chest tube was placed emergently with immediate improvement in lung volumes.   PAST MEDICAL HISTORY :  Past Medical History:  Diagnosis Date  . Adenomatous colon polyp   . Anginal pain ([place])   . CAD (coronary artery disease)    s/p PCI  . Chronic low back pain   . Chronic lower back pain   . Colon polyps   . COPD (chronic obstructive pulmonary disease) ([place])    on nocturnal home o2  . CVA (cerebral infarction)   . Diastolic CHF ([place])   . DJD (degenerative joint disease)   . DJD (degenerative joint disease)   . GERD (gastroesophageal reflux disease)   . History of hiatal hernia   . Hypercholesteremia   . Hyperlipidemia   . Hypertension   . Hypoxemia   . Obesity   . Osteoporosis   . Peripheral vascular disease ([place])   . Stroke [facility])    Past Surgical History:  Procedure Laterality Date  . ABDOMINAL HYSTERECTOMY    . APPENDECTOMY    . BACK SURGERY  2002,2003  . CARDIAC CATHETERIZATION Left 06/20/2015   Procedure: Left Heart Cath and Coronary Angiography;  Surgeon: Yolonda Kida, MD;  Location: [facility];  Service: Cardiovascular;  Laterality: Left;  . CAROTID ENDARTERECTOMY  2009  Right  . CHOLECYSTECTOMY    . CORONARY ANGIOPLASTY  2008   with stent  . EYE SURGERY    . IMPLANTATION BONE ANCHORED HEARING AID  2010  . PARTIAL HYSTERECTOMY    . VEIN LIGATION AND STRIPPING     Prior to Admission medications   Medication Sig Start Date End Date Taking? Authorizing Provider  acetaminophen (TYLENOL) 500 MG tablet Take 1,000 mg by mouth every 6 (six) hours as needed for mild pain or headache.    Historical Provider, MD  albuterol (PROVENTIL HFA;VENTOLIN HFA) 108 (90 BASE) MCG/ACT  inhaler Inhale 2 puffs into the lungs every 6 (six) hours as needed for wheezing or shortness of breath.     Historical Provider, MD  azithromycin (ZITHROMAX Z-PAK) 250 MG tablet Take 2 tablets (500 mg) on  Day 1,  followed by 1 tablet (250 mg) once daily on Days 2 through 5. 12/27/15   Earleen Newport, MD  clopidogrel (PLAVIX) 75 MG tablet Take 75 mg by mouth every evening.     Historical Provider, MD  COMBIVENT RESPIMAT 20-100 MCG/ACT AERS respimat Inhale 1 puff into the lungs 4 (four) times daily. 05/27/14   Gladstone Lighter, MD  esomeprazole (NEXIUM) 40 MG capsule Take 40 mg by mouth 2 (two) times daily.    Historical Provider, MD  Fluticasone-Salmeterol (ADVAIR) 250-50 MCG/DOSE AEPB Inhale 1 puff into the lungs 2 (two) times daily.    Historical Provider, MD  furosemide (LASIX) 40 MG tablet Take 40 mg by mouth daily as needed for fluid or edema.     Historical Provider, MD  guaiFENesin (MUCINEX) 600 MG 12 hr tablet Take 1 tablet (600 mg total) by mouth 2 (two) times daily. 05/02/15   Dustin Flock, MD  guaiFENesin-codeine 100-10 MG/5ML syrup Take 10 mLs by mouth every 6 (six) hours as needed for cough. 05/02/15   Dustin Flock, MD  hydrochlorothiazide (HYDRODIURIL) 12.5 MG tablet Take 12.5 mg by mouth every evening.    Historical Provider, MD  ipratropium-albuterol (DUONEB) 0.5-2.5 (3) MG/3ML SOLN Take 3 mLs by nebulization every 4 (four) hours as needed (for wheezing and/or shortness of breath).     Historical Provider, MD  labetalol (NORMODYNE) 100 MG tablet Take 100 mg by mouth 2 (two) times daily. 05/08/15   Historical Provider, MD  lisinopril (PRINIVIL,ZESTRIL) 20 MG tablet Take 20 mg by mouth every morning.     Historical Provider, MD  nitroGLYCERIN (NITROSTAT) 0.4 MG SL tablet Place 0.4 mg under the tongue every 5 (five) minutes as needed for chest pain.     Historical Provider, MD  Oxycodone HCl 10 MG TABS Take 1 tablet (10 mg total) by mouth every 4 (four) hours as needed (for pain.).  02/28/15   Bettey Costa, MD  predniSONE (DELTASONE) 20 MG tablet Take 1 tablet (20 mg total) by mouth daily. 07/21/15 07/20/16  Vishal Mungal, MD  predniSONE (STERAPRED UNI-PAK 21 TAB) 10 MG (21) TBPK tablet Dispense steroid taper pack as directed 12/27/15   Earleen Newport, MD  simvastatin (ZOCOR) 20 MG tablet Take 20 mg by mouth every evening.     Historical Provider, MD  theophylline (THEO-24) 200 MG 24 hr capsule Take 1 capsule (200 mg total) by mouth daily. 06/08/15   Lytle Butte, MD   Allergies  Allergen Reactions  . Dexlansoprazole Nausea And Vomiting  . Ultram [Tramadol] Itching    FAMILY HISTORY:  Family History  Problem Relation Age of Onset  . Atrial fibrillation Mother   .  CAD Father    SOCIAL HISTORY:  reports that she has been smoking Cigarettes.  She has a 4.00 pack-year smoking history. She has never used smokeless tobacco. She reports that she does not drink alcohol or use drugs.  REVIEW OF SYSTEMS:   Could not obtain due to critical illness.    VITAL SIGNS: Pulse Rate:  [117] 117 (01/15 1600) Resp:  [32] 32 (01/15 1600) BP: (173)/(140) 173/140 (01/15 1600) SpO2:  [99 %] 99 % (01/15 1600) FiO2 (%):  [50 %] 50 % (01/15 1642) Weight:  [166 lb (75.3 kg)-180 lb (81.6 kg)] 166 lb (75.3 kg) (01/15 1648) HEMODYNAMICS:   VENTILATOR SETTINGS: Vent Mode: PCV FiO2 (%):  [50 %] 50 % Set Rate:  [16 bmp-22 bmp] 22 bmp Vt Set:  [500 mL] 500 mL PEEP:  [5 cmH20-7 cmH20] 5 cmH20 INTAKE / OUTPUT: No intake or output data in the 24 hours ending 02/04/2016 1701  Physical Examination:   VS: BP (!) 173/140   Pulse (!) 117   Resp (!) 32   Wt 166 lb (75.3 kg)   SpO2 99%   BMI 31.37 kg/m   General Appearance: ashen  Neuro:without focal findings, mental status reduced.  HEENT: Pupils sluggish no other could be elicited.   Pulmonary: normal breath sounds., diaphragmatic excursion normal. CardiovascularNormal S1,S2.  No m/r/g.    Abdomen: Benign, Soft, non-tender, No masses,  hepatosplenomegaly, No lymphadenopathy Renal:  No costovertebral tenderness  GU:  Not performed at this time. Endoc: No evident thyromegaly, no signs of acromegaly. Skin:   warm, no rashes, no ecchymosis  Extremities: normal, no cyanosis, clubbing, no edema, warm with normal capillary refill.    LABS: Reviewed   LABORATORY PANEL:   CBC  Recent Labs Lab 01/24/2016 1537  WBC 16.2*  HGB 14.1  HCT 44.7  PLT 241    Chemistries   Recent Labs Lab 01/25/2016 1537  NA 139  K 4.6  CL 104  CO2 21*  GLUCOSE 234*  BUN 16  CREATININE 0.87  CALCIUM 9.1     Recent Labs Lab 01/22/2016 1550  GLUCAP 250*    Recent Labs Lab 02/03/2016 1537  PHART 6.90*  PCO2ART 105*  PO2ART 173*   No results for input(s): AST, ALT, ALKPHOS, BILITOT, ALBUMIN in the last 168 hours.  Cardiac Enzymes  Recent Labs Lab 01/11/2016 1537  TROPONINI 0.04*    RADIOLOGY:  Dg Chest 2 View  Result Date: 01/18/2016 CLINICAL DATA:  Cough with fever for 2-3 days, history COPD, asthma, smoking, coronary artery disease post stenting, CHF, prior stroke EXAM: CHEST  2 VIEW COMPARISON:  12/27/2015 FINDINGS: Normal heart size, mediastinal contours, and pulmonary vascularity. Atherosclerotic calcification aorta. Lungs minimally hyperinflated but clear. No infiltrate, pleural effusion or pneumothorax. Bones demineralized. IMPRESSION: No acute abnormalities. Electronically Signed   By: Lavonia Dana M.D.   On: 01/18/2016 14:15   Dg Chest Portable 1 View  Result Date: 01/20/2016 CLINICAL DATA:  ET tube placement. EXAM: PORTABLE CHEST 1 VIEW COMPARISON:  01/29/2016. FINDINGS: Again evaluation limited by trauma board. Endotracheal tube tip has been withdrawn and appears to be approximately 3 cm above the carina. NG tube noted, and its tip is not definitely identified and may be within the esophagus. Heart size normal. Probable left-sided pneumothorax. Possibly with tension as there is shift of the mediastinum to the right.  Left chest wall subcutaneous emphysema. IMPRESSION: 1. Endotracheal tube tip has been withdrawn, its tip is approximately 3 cm above the carina. NG tube  appears to be present, its tip is not identified and may be in the esophagus. 2. Probable pneumothorax, with possible tension. Left chest wall subcutaneous emphysema. Critical Value/emergent results were called by telephone at the time of interpretation on 01/18/2016 at 4:50 pm to Dr. Archie Balboa, who verbally acknowledged these results. Electronically Signed   By: Marcello Moores  Register   On: 01/21/2016 16:57   Dg Chest Portable 1 View  Result Date: 01/12/2016 CLINICAL DATA:  66 year old hypertensive female became unresponsive. Post cardiac arrest and CPR. Subsequent encounter. EXAM: PORTABLE CHEST 1 VIEW COMPARISON:  01/18/2016 chest x-ray. FINDINGS: Evaluation limited by trauma board. Complete chest not included on present exam. Endotracheal tube is in place. The tip appears to be 1 cm above the carina. Recommend retracting by 2 cm. No obvious pneumothorax, infiltrate or pulmonary edema. Heart size within normal limits. IMPRESSION: Evaluation limited by trauma board. Complete chest not included on present exam. Endotracheal tube is in place. The tip appears to be 1 cm above the carina. Recommend retracting by 2 cm. No obvious pneumothorax, infiltrate or pulmonary edema. It would be helpful to obtain a follow-up exam off trauma board. Electronically Signed   By: Genia Del M.D.   On: 01/16/2016 16:11       --Marda Stalker, MD.  Board Certified in Internal Medicine, Pulmonary Medicine, [place], and Sleep Medicine.  ICU Pager [phone]  Pulmonary and Critical Care Office Number: [phone]  Patricia Pesa, M.D.  Vilinda Boehringer, M.D.  Merton Border, M.D   01/25/2016, 5:01 PM   Critical Care Attestation.  I have personally obtained a history, examined the patient, evaluated laboratory and imaging results, formulated the  assessment and plan and placed orders. The Patient requires high complexity decision making for assessment and support, frequent evaluation and titration of therapies, application of advanced monitoring technologies and extensive interpretation of multiple databases. The patient has critical illness that could lead imminently to failure of 1 or more organ systems and requires the highest level of physician preparedness to intervene.  Critical Care Time devoted to patient care services described in this note is 65 minutes and is exclusive of time spent in procedures.

## 2016-01-19 NOTE — Progress Notes (Signed)
Pt care handed off to [place], rn. Pt is sedated on fentanyl drip, epinephrine drip remains infusing.  Pt is resting comfortably.  Family was updated.

## 2016-01-19 NOTE — ED Notes (Addendum)
Patient presents to the ED via EMS from doctor's office.  Patient became unresponsive at approx. 1500 and cpr was started.  Patient has history of copd.  EMs gave 1 of epi at 15:06 and got a pulse back at 15:07.

## 2016-01-19 NOTE — Progress Notes (Signed)
Pharmacy Antibiotic Note  Dawn Donaldson is a 66 y.o. female admitted on 01/30/2016 with pneumonia and sepsis.  Pharmacy has been consulted for Vancomycin and Zosyn dosing.  Plan: Patient received Vancomycin 1g IV in the ED ~ 17:00. Ordered Vancomycin 750mg  IV every 12 hours to begin 6 hours after initial dose for stacked dosing.  Goal trough 15-20 mcg/mL. Trough level ordered 1/17 at 10:30.   Ordered Zosyn 3.375g IV q8h (4 hour infusion).  Weight: 166 lb (75.3 kg)  Temp (24hrs), Avg:96.2 F (35.7 C), Min:95.4 F (35.2 C), Max:97.8 F (36.6 C)   Recent Labs Lab 01/18/16 1328 01/13/2016 1537  WBC 10.7 16.2*  CREATININE 0.74 0.87  LATICACIDVEN  --  8.8*    Estimated Creatinine Clearance: 61.4 mL/min (by C-G formula based on SCr of 0.87 mg/dL).    Allergies  Allergen Reactions  . Dexlansoprazole Nausea And Vomiting  . Ultram [Tramadol] Itching    Antimicrobials this admission: Vancomycin 1/15 >>  Zosyn 1/15 >>   Dose adjustments this admission: N/A  Microbiology results: 1/15 BCx: pending 1/15 UCx: pending  1/15 Culture, Respiratory (tracheal aspirate): pending   Thank you for allowing pharmacy to be a part of this patient's care.  Olivia Canter, [place] Clinical Pharmacist 01/24/2016 7:42 PM

## 2016-01-19 NOTE — ED Notes (Signed)
Discussed patient's hypotension with Dr. Archie Balboa.  Dr. Archie Balboa verbalized order for fluid bolus and continue to monitor patient as well as remove nitro paste.

## 2016-01-19 NOTE — ED Notes (Signed)
Pulses returned

## 2016-01-19 NOTE — ED Provider Notes (Signed)
[facility] Emergency Department Provider Note   ____________________________________________   I have reviewed the triage vital signs and the nursing notes.   HISTORY  Chief Complaint Cardiac Arrest   History limited by: Unresponsive, intubated   HPI Dawn Donaldson is a 66 y.o. female who presents to the emergency department today via EMS after cardiac arrest. Patient was at doctors office, was there for shortness of breath. Per chart review this has been going on a couple of days (did register in the ED yesterday but does not appear to have seen a providor). While at the doctors office the patient was having shortness of breath, then became unresponsive. When EMS arrived patient was in PEA. EMS intubated, gave one round of epinephrine and got ROSC in about 5 minutes.    Past Medical History:  Diagnosis Date  . Adenomatous colon polyp   . Anginal pain ([place])   . CAD (coronary artery disease)    s/p PCI  . Chronic low back pain   . Chronic lower back pain   . Colon polyps   . COPD (chronic obstructive pulmonary disease) ([place])    on nocturnal home o2  . CVA (cerebral infarction)   . Diastolic CHF ([place])   . DJD (degenerative joint disease)   . DJD (degenerative joint disease)   . GERD (gastroesophageal reflux disease)   . History of hiatal hernia   . Hypercholesteremia   . Hyperlipidemia   . Hypertension   . Hypoxemia   . Obesity   . Osteoporosis   . Peripheral vascular disease ([place])   . Stroke [facility])     Patient Active Problem List   Diagnosis Date Noted  . Abnormal CT scan of lung 07/21/2015  . Pulmonary nodules 07/21/2015  . Tobacco abuse counseling 07/21/2015  . Acute on chronic respiratory failure ([place]) 04/29/2015  . COPD exacerbation ([place]) 12/28/2014  . COPD (chronic obstructive pulmonary disease) ([place]) 05/23/2014    Past Surgical History:  Procedure Laterality Date  . ABDOMINAL HYSTERECTOMY    . APPENDECTOMY    . BACK SURGERY   2002,2003  . CARDIAC CATHETERIZATION Left 06/20/2015   Procedure: Left Heart Cath and Coronary Angiography;  Surgeon: Yolonda Kida, MD;  Location: [place] CV LAB;  Service: Cardiovascular;  Laterality: Left;  . CAROTID ENDARTERECTOMY  2009   Right  . CHOLECYSTECTOMY    . CORONARY ANGIOPLASTY  2008   with stent  . EYE SURGERY    . IMPLANTATION BONE ANCHORED HEARING AID  2010  . PARTIAL HYSTERECTOMY    . VEIN LIGATION AND STRIPPING      Prior to Admission medications   Medication Sig Start Date End Date Taking? Authorizing Provider  acetaminophen (TYLENOL) 500 MG tablet Take 1,000 mg by mouth every 6 (six) hours as needed for mild pain or headache.    Historical Provider, MD  albuterol (PROVENTIL HFA;VENTOLIN HFA) 108 (90 BASE) MCG/ACT inhaler Inhale 2 puffs into the lungs every 6 (six) hours as needed for wheezing or shortness of breath.     Historical Provider, MD  azithromycin (ZITHROMAX Z-PAK) 250 MG tablet Take 2 tablets (500 mg) on  Day 1,  followed by 1 tablet (250 mg) once daily on Days 2 through 5. 12/27/15   Earleen Newport, MD  clopidogrel (PLAVIX) 75 MG tablet Take 75 mg by mouth every evening.     Historical Provider, MD  COMBIVENT RESPIMAT 20-100 MCG/ACT AERS respimat Inhale 1 puff into the lungs 4 (four)  times daily. 05/27/14   Gladstone Lighter, MD  esomeprazole (NEXIUM) 40 MG capsule Take 40 mg by mouth 2 (two) times daily.    Historical Provider, MD  Fluticasone-Salmeterol (ADVAIR) 250-50 MCG/DOSE AEPB Inhale 1 puff into the lungs 2 (two) times daily.    Historical Provider, MD  furosemide (LASIX) 40 MG tablet Take 40 mg by mouth daily as needed for fluid or edema.     Historical Provider, MD  guaiFENesin (MUCINEX) 600 MG 12 hr tablet Take 1 tablet (600 mg total) by mouth 2 (two) times daily. 05/02/15   Dustin Flock, MD  guaiFENesin-codeine 100-10 MG/5ML syrup Take 10 mLs by mouth every 6 (six) hours as needed for cough. 05/02/15   Dustin Flock, MD   hydrochlorothiazide (HYDRODIURIL) 12.5 MG tablet Take 12.5 mg by mouth every evening.    Historical Provider, MD  ipratropium-albuterol (DUONEB) 0.5-2.5 (3) MG/3ML SOLN Take 3 mLs by nebulization every 4 (four) hours as needed (for wheezing and/or shortness of breath).     Historical Provider, MD  labetalol (NORMODYNE) 100 MG tablet Take 100 mg by mouth 2 (two) times daily. 05/08/15   Historical Provider, MD  lisinopril (PRINIVIL,ZESTRIL) 20 MG tablet Take 20 mg by mouth every morning.     Historical Provider, MD  nitroGLYCERIN (NITROSTAT) 0.4 MG SL tablet Place 0.4 mg under the tongue every 5 (five) minutes as needed for chest pain.     Historical Provider, MD  Oxycodone HCl 10 MG TABS Take 1 tablet (10 mg total) by mouth every 4 (four) hours as needed (for pain.). 02/28/15   Bettey Costa, MD  predniSONE (DELTASONE) 20 MG tablet Take 1 tablet (20 mg total) by mouth daily. 07/21/15 07/20/16  Vishal Mungal, MD  predniSONE (STERAPRED UNI-PAK 21 TAB) 10 MG (21) TBPK tablet Dispense steroid taper pack as directed 12/27/15   Earleen Newport, MD  simvastatin (ZOCOR) 20 MG tablet Take 20 mg by mouth every evening.     Historical Provider, MD  theophylline (THEO-24) 200 MG 24 hr capsule Take 1 capsule (200 mg total) by mouth daily. 06/08/15   Lytle Butte, MD    Allergies Dexlansoprazole and Ultram [tramadol]  Family History  Problem Relation Age of Onset  . Atrial fibrillation Mother   . CAD Father     Social History Social History  Substance Use Topics  . Smoking status: Current Some Day Smoker    Packs/day: 0.10    Years: 40.00    Types: Cigarettes    Last attempt to quit: 03/20/2015  . Smokeless tobacco: Never Used     Comment: a pack can last 3-44mo  . Alcohol use No    Review of Systems Unable to obtain secondary to patient condition ____________________________________________   PHYSICAL EXAM:  VITAL SIGNS: ED Triage Vitals [02/03/2016 1535]  Enc Vitals Group     BP 173/140      Pulse 117     Resp 32     Temp      Temp src      SpO2 99     Weight 180 lb (81.6 kg)    Constitutional: Unresponsive. Intubated. Actively being bagged.  Eyes: Conjunctivae are normal. Pupils roughly 2 mm in diameter.  ENT   Head: Normocephalic and atraumatic.   Nose: No congestion/rhinnorhea.   Mouth/Throat: Mucous membranes are moist.   Neck: No stridor. Cardiovascular: Tachycardic. No murmurs appreciated.  Respiratory: Intubated. Being bagged. Breath sounds present on right side. No breath sounds appreciated on left side.  Gastrointestinal: Soft and non tender. No rebound. No guarding.  Genitourinary: Deferred Musculoskeletal: Normal range of motion in all extremities. No lower extremity edema. Neurologic:  Unresponsive. Somewhat fighting the ET tube. No purposeful movements.  Skin:  Skin is warm, dry and intact. No rash noted.   ____________________________________________    LABS (pertinent positives/negatives)  Labs Reviewed  CBC - Abnormal; Notable for the following:       Result Value   WBC 16.2 (*)    MCV 104.1 (*)    MCHC 31.5 (*)    RDW 14.9 (*)    All other components within normal limits  BASIC METABOLIC PANEL - Abnormal; Notable for the following:    CO2 21 (*)    Glucose, Bld 234 (*)    All other components within normal limits  TROPONIN I - Abnormal; Notable for the following:    Troponin I 0.04 (*)    All other components within normal limits  BLOOD GAS, ARTERIAL - Abnormal; Notable for the following:    pH, Arterial 6.90 (*)    pCO2 arterial 105 (*)    pO2, Arterial 173 (*)    Acid-base deficit 14.8 (*)    All other components within normal limits  LACTIC ACID, PLASMA - Abnormal; Notable for the following:    Lactic Acid, Venous 8.8 (*)    All other components within normal limits  LACTIC ACID, PLASMA - Abnormal; Notable for the following:    Lactic Acid, Venous 2.6 (*)    All other components within normal limits  APTT - Abnormal;  Notable for the following:    aPTT 40 (*)    All other components within normal limits  PROTIME-INR - Abnormal; Notable for the following:    Prothrombin Time 16.9 (*)    All other components within normal limits  GLUCOSE, CAPILLARY - Abnormal; Notable for the following:    Glucose-Capillary 250 (*)    All other components within normal limits  URINALYSIS, COMPLETE (UACMP) WITH MICROSCOPIC - Abnormal; Notable for the following:    Color, Urine YELLOW (*)    APPearance HAZY (*)    Glucose, UA 50 (*)    Hgb urine dipstick MODERATE (*)    Protein, ur >=300 (*)    All other components within normal limits  CULTURE, BLOOD (ROUTINE X 2)  CULTURE, BLOOD (ROUTINE X 2)  URINE CULTURE  CULTURE, RESPIRATORY (NON-EXPECTORATED)  RAPID INFLUENZA A&B ANTIGENS ([facility] ONLY)  MRSA PCR SCREENING  INFLUENZA PANEL BY PCR (TYPE A & B)  CBC  CREATININE, SERUM  BASIC METABOLIC PANEL  CBC  BLOOD GAS, ARTERIAL  CBC  BASIC METABOLIC PANEL  MAGNESIUM  PHOSPHORUS  BLOOD GAS, ARTERIAL  CBG MONITORING, ED     ____________________________________________   EKG  I, Nance Pear, attending physician, personally viewed and interpreted this EKG  EKG Time: 1532 Rate: 147 Rhythm: atrial tachycardia Axis: right axis deviation Intervals: qtc 488 QRS: RBBB ST changes: no st elevation Impression: abnormal ekg  I, Nance Pear, attending physician, personally viewed and interpreted this EKG  EKG Time: 1537 Rate: 103 Rhythm: sinus tachycardia Axis: normal Intervals: qtc 503 QRS: RBBB ST changes: no st elevation Impression: abnormal ekg   ____________________________________________    RADIOLOGY  CXR IMPRESSION:  Evaluation limited by trauma board. Complete chest not included on  present exam.    Endotracheal tube is in place. The tip appears to be 1 cm above the  carina. Recommend retracting by 2 cm.    No obvious  pneumothorax, infiltrate or pulmonary edema.    It would be  helpful to obtain a follow-up exam off trauma board.   CXR IMPRESSION:  1. Endotracheal tube tip has been withdrawn, its tip is  approximately 3 cm above the carina. NG tube appears to be present,  its tip is not identified and may be in the esophagus.    2. Probable pneumothorax, with possible tension. Left chest wall  subcutaneous emphysema.    ____________________________________________   PROCEDURES  Procedures  CRITICAL CARE Performed by: Nance Pear   Total critical care time: 45 minutes  Critical care time was exclusive of separately billable procedures and treating other patients.  Critical care was necessary to treat or prevent imminent or life-threatening deterioration.  Critical care was time spent personally by me on the following activities: development of treatment plan with patient and/or surrogate as well as nursing, discussions with consultants, evaluation of patient's response to treatment, examination of patient, obtaining history from patient or surrogate, ordering and performing treatments and interventions, ordering and review of laboratory studies, ordering and review of radiographic studies, pulse oximetry and re-evaluation of patient's condition.  Cardiopulmonary Resuscitation (CPR) Procedure Note Directed/Performed by: Nance Pear I personally directed ancillary staff and/or performed CPR in an effort to regain return of spontaneous circulation and to maintain cardiac, neuro and systemic perfusion.    ____________________________________________   INITIAL IMPRESSION / ASSESSMENT AND PLAN / ED COURSE  Pertinent labs & imaging results that were available during my care of the patient were reviewed by me and considered in my medical decision making (see chart for details).  Patient presents to the emergency department via EMS as emergency traffic after cardiac arrest at doctor's office. Initial exam here patient did have a good pulse. The  patient was intubated. Breath sounds were somewhat decreased on the left side. Initial chest x-ray did show ET tube in place however this was pulled back slightly. Since this doctor was contacted to consider cooling patient given short downtime. This point I think likely patient's cardiopulmonary arrest was respiratory in origin. She had been complaining of shortness breath for the past few days. ABG did seem to support this with severe acidosis and elevated carbon dioxide. I did discuss the patient's situation with the family however patient coded once more here in the emergency department. The patient had return of spontaneous circulation, she received two doses of epinephrine and sodium bicarb. The patient was seen by ICU doctor to placed a chest tube after repeat x-ray showed pneumothorax and subcutaneous emphysema. Likely secondary to CPR. Patient did have broken ribs. The patient will be admitted to the ICU.   ____________________________________________   FINAL CLINICAL IMPRESSION(S) / ED DIAGNOSES  Final diagnoses:  Cardiopulmonary arrest [facility])     Note: This dictation was prepared with Dragon dictation. Any transcriptional errors that result from this process are unintentional     Nance Pear, MD 01/18/2016 2014

## 2016-01-20 ENCOUNTER — Inpatient Hospital Stay: Payer: Medicare Other

## 2016-01-20 ENCOUNTER — Inpatient Hospital Stay: Admit: 2016-01-20 | Payer: Medicare Other

## 2016-01-20 DIAGNOSIS — S270XXA Traumatic pneumothorax, initial encounter: Secondary | ICD-10-CM

## 2016-01-20 DIAGNOSIS — G9341 Metabolic encephalopathy: Secondary | ICD-10-CM

## 2016-01-20 LAB — BASIC METABOLIC PANEL
ANION GAP: 10 (ref 5–15)
Anion gap: 9 (ref 5–15)
BUN: 23 mg/dL — ABNORMAL HIGH (ref 6–20)
BUN: 24 mg/dL — ABNORMAL HIGH (ref 6–20)
CALCIUM: 6.8 mg/dL — AB (ref 8.9–10.3)
CO2: 26 mmol/L (ref 22–32)
CO2: 27 mmol/L (ref 22–32)
CREATININE: 1.12 mg/dL — AB (ref 0.44–1.00)
Calcium: 6.8 mg/dL — ABNORMAL LOW (ref 8.9–10.3)
Chloride: 106 mmol/L (ref 101–111)
Chloride: 107 mmol/L (ref 101–111)
Creatinine, Ser: 1.16 mg/dL — ABNORMAL HIGH (ref 0.44–1.00)
GFR calc Af Amer: 59 mL/min — ABNORMAL LOW (ref >60)
GFR calc non Af Amer: 51 mL/min — ABNORMAL LOW (ref >60)
GFR, EST AFRICAN AMERICAN: 57 mL/min — AB (ref >60)
GFR, EST NON AFRICAN AMERICAN: 49 mL/min — AB (ref >60)
GLUCOSE: 322 mg/dL — AB (ref 65–99)
Glucose, Bld: 292 mg/dL — ABNORMAL HIGH (ref 65–99)
Potassium: 3.4 mmol/L — ABNORMAL LOW (ref 3.5–5.1)
Potassium: 3.9 mmol/L (ref 3.5–5.1)
SODIUM: 143 mmol/L (ref 135–145)
Sodium: 142 mmol/L (ref 135–145)

## 2016-01-20 LAB — BLOOD GAS, ARTERIAL
ACID-BASE DEFICIT: 1.1 mmol/L (ref 0.0–2.0)
ACID-BASE EXCESS: 7.8 mmol/L — AB (ref 0.0–2.0)
Acid-Base Excess: 2.1 mmol/L — ABNORMAL HIGH (ref 0.0–2.0)
Bicarbonate: 22 mmol/L (ref 20.0–28.0)
Bicarbonate: 25.5 mmol/L (ref 20.0–28.0)
Bicarbonate: 33.7 mmol/L — ABNORMAL HIGH (ref 20.0–28.0)
FIO2: 0.03
FIO2: 0.3
FIO2: 30
MECHANICAL RATE: 24
MECHVT: 450 mL
Mechanical Rate: 24
Mechanical Rate: 26
O2 Saturation: 96.9 %
O2 Saturation: 99.2 %
O2 Saturation: 99.6 %
PCO2 ART: 35 mmHg (ref 32.0–48.0)
PEEP/CPAP: 5 cmH2O
PEEP: 5 cmH2O
PH ART: 7.47 — AB (ref 7.350–7.450)
PH ART: 7.46 — AB (ref 7.350–7.450)
PO2 ART: 88 mmHg (ref 83.0–108.0)
PRESSURE CONTROL: 45 cmH2O
PRESSURE CONTROL: 45 cmH2O
Patient temperature: 37
Patient temperature: 37
Patient temperature: 37
RATE: 24 resp/min
pCO2 arterial: 31 mmHg — ABNORMAL LOW (ref 32.0–48.0)
pCO2 arterial: 52 mmHg — ABNORMAL HIGH (ref 32.0–48.0)
pH, Arterial: 7.42 (ref 7.350–7.450)
pO2, Arterial: 136 mmHg — ABNORMAL HIGH (ref 83.0–108.0)
pO2, Arterial: 176 mmHg — ABNORMAL HIGH (ref 83.0–108.0)

## 2016-01-20 LAB — MAGNESIUM
MAGNESIUM: 1.4 mg/dL — AB (ref 1.7–2.4)
MAGNESIUM: 1.4 mg/dL — AB (ref 1.7–2.4)
MAGNESIUM: 2.3 mg/dL (ref 1.7–2.4)

## 2016-01-20 LAB — CBC
HCT: 35.9 % (ref 35.0–47.0)
HEMATOCRIT: 35.8 % (ref 35.0–47.0)
HEMOGLOBIN: 11.7 g/dL — AB (ref 12.0–16.0)
Hemoglobin: 11.8 g/dL — ABNORMAL LOW (ref 12.0–16.0)
MCH: 32.8 pg (ref 26.0–34.0)
MCH: 32.6 pg (ref 26.0–34.0)
MCHC: 33 g/dL (ref 32.0–36.0)
MCHC: 32.5 g/dL (ref 32.0–36.0)
MCV: 100.2 fL — ABNORMAL HIGH (ref 80.0–100.0)
MCV: 99.4 fL (ref 80.0–100.0)
PLATELETS: 189 10*3/uL (ref 150–440)
Platelets: 182 10*3/uL (ref 150–440)
RBC: 3.58 MIL/uL — ABNORMAL LOW (ref 3.80–5.20)
RBC: 3.6 MIL/uL — ABNORMAL LOW (ref 3.80–5.20)
RDW: 13.7 % (ref 11.5–14.5)
RDW: 13.8 % (ref 11.5–14.5)
WBC: 25.8 10*3/uL — AB (ref 3.6–11.0)
WBC: 26.3 10*3/uL — ABNORMAL HIGH (ref 3.6–11.0)

## 2016-01-20 LAB — GLUCOSE, CAPILLARY
GLUCOSE-CAPILLARY: 194 mg/dL — AB (ref 65–99)
GLUCOSE-CAPILLARY: 267 mg/dL — AB (ref 65–99)
GLUCOSE-CAPILLARY: 272 mg/dL — AB (ref 65–99)
GLUCOSE-CAPILLARY: 147 mg/dL — AB (ref 65–99)
Glucose-Capillary: 128 mg/dL — ABNORMAL HIGH (ref 65–99)

## 2016-01-20 LAB — TRIGLYCERIDES: Triglycerides: 79 mg/dL (ref <150)

## 2016-01-20 LAB — PHOSPHORUS
PHOSPHORUS: 1.6 mg/dL — AB (ref 2.5–4.6)
PHOSPHORUS: 1.7 mg/dL — AB (ref 2.5–4.6)
Phosphorus: 3.1 mg/dL (ref 2.5–4.6)

## 2016-01-20 LAB — TROPONIN I
TROPONIN I: 0.16 ng/mL — AB (ref <0.03)
Troponin I: 0.16 ng/mL (ref <0.03)
Troponin I: 0.26 ng/mL (ref <0.03)

## 2016-01-20 MED ORDER — PNEUMOCOCCAL VAC POLYVALENT 25 MCG/0.5ML IJ INJ: 0.5000 mL | INJECTION | INTRAMUSCULAR | Status: DC

## 2016-01-20 MED ORDER — POTASSIUM PHOSPHATES 15 MMOLE/5ML IV SOLN
20.0000 mmol | Freq: Once | INTRAVENOUS | Status: AC
  Administered 2016-01-20: 20 mmol via INTRAVENOUS
  Filled 2016-01-20: qty 6.67

## 2016-01-20 MED ORDER — VITAL HIGH PROTEIN PO LIQD
1000.0000 mL | ORAL | Status: DC
  Administered 2016-01-20 (×3)
  Administered 2016-01-20: 1000 mL
  Administered 2016-01-20 – 2016-01-21 (×6)
  Administered 2016-01-22: 1000 mL
  Administered 2016-01-22 (×2)
  Administered 2016-01-22: 1000 mL
  Administered 2016-01-23: 19:00:00
  Administered 2016-01-23: 1000 mL
  Administered 2016-01-23 – 2016-01-24 (×6)
  Administered 2016-01-24: 1000 mL
  Administered 2016-01-24 (×10)
  Administered 2016-01-25: 1000 mL
  Administered 2016-01-25: 22:00:00
  Administered 2016-01-25: 1000 mL
  Administered 2016-01-25 – 2016-01-26 (×19)

## 2016-01-20 MED ORDER — OSELTAMIVIR PHOSPHATE 30 MG PO CAPS
30.0000 mg | ORAL_CAPSULE | Freq: Two times a day (BID) | ORAL | Status: DC
  Administered 2016-01-20: 30 mg via ORAL
  Filled 2016-01-20: qty 1

## 2016-01-20 MED ORDER — MAGNESIUM SULFATE 4 GM/100ML IV SOLN
4.0000 g | Freq: Once | INTRAVENOUS | Status: AC
  Administered 2016-01-20: 4 g via INTRAVENOUS
  Filled 2016-01-20: qty 100

## 2016-01-20 MED ORDER — INFLUENZA VAC SPLIT QUAD 0.5 ML IM SUSY: 0.5000 mL | PREFILLED_SYRINGE | INTRAMUSCULAR | Status: DC

## 2016-01-20 MED ORDER — PROPOFOL 1000 MG/100ML IV EMUL
0.0000 ug/kg/min | INTRAVENOUS | Status: DC
  Administered 2016-01-20: 10 ug/kg/min via INTRAVENOUS
  Administered 2016-01-21: 25 ug/kg/min via INTRAVENOUS
  Administered 2016-01-21: 15 ug/kg/min via INTRAVENOUS
  Administered 2016-01-21: 30 ug/kg/min via INTRAVENOUS
  Administered 2016-01-22: 40 ug/kg/min via INTRAVENOUS
  Administered 2016-01-22: 45 ug/kg/min via INTRAVENOUS
  Administered 2016-01-22: 40 ug/kg/min via INTRAVENOUS
  Filled 2016-01-20 (×9): qty 100

## 2016-01-20 MED ORDER — INSULIN GLARGINE 100 UNIT/ML ~~LOC~~ SOLN
12.0000 [IU] | Freq: Every day | SUBCUTANEOUS | Status: DC
  Administered 2016-01-20 – 2016-01-26 (×7): 12 [IU] via SUBCUTANEOUS
  Filled 2016-01-20 (×10): qty 0.12

## 2016-01-20 NOTE — Progress Notes (Signed)
Inpatient Diabetes Program Recommendations  AACE/ADA: New Consensus Statement on Inpatient Glycemic Control (2015)  Target Ranges:  Prepandial:   less than 140 mg/dL      Peak postprandial:   less than 180 mg/dL (1-2 hours)      Critically ill patients:  140 - 180 mg/dL   Lab Results  Component Value Date   GLUCAP 128 (H) 01/20/2016   HGBA1C 5.7 06/05/2015    Review of Glycemic Control  Results for Dawn Donaldson, Dawn Donaldson (MRN [id number]) as of 01/20/2016 12:21  Ref. Range 01/16/2016 15:50 01/12/2016 20:02 01/25/2016 23:03 01/20/2016 04:46 01/20/2016 07:24 01/20/2016 11:03  Glucose-Capillary Latest Ref Range: 65 - 99 mg/dL 250 (H) 194 (H) 215 (H) 267 (H) 272 (H) 128 (H)    Diabetes history: none Outpatient Diabetes medications: none Current orders for Inpatient glycemic control: Lantus 12 units qday, Novolog 0-15 units q4h  Inpatient Diabetes Program Recommendations:  Consider decreasing Novolog correction to sensitive correction 0-9 units tid  Gentry Fitz, RN, [place], [place], CDE Diabetes Coordinator Inpatient Diabetes Program  [phone] (Team Pager) [phone] ([place]) 01/20/2016 12:25 PM

## 2016-01-20 NOTE — Progress Notes (Signed)
Pharmacy  Note  Dawn Donaldson is a 66 y.o. female admitted on 01/08/2016 with pneumonia and sepsis.  Pharmacy has been consulted for Vancomycin and Zosyn dosing.  Plan: 1. Antibiotics: Continue Zosyn 3.375g IV q8h (4 hour infusion) Vancomycin D/C'd based off of MRSA PCR.   2. Electrolytes: K 3.4, Mg 1.4, Phos 1.6. Will replace magnesium with magnesium sulfate 4g IV x1. Ordered potassium phosphate 81mmol IV x1. Will follow-up with labs in the AM.    Height: 5\' 6"  (167.6 cm) Weight: 183 lb 13.8 oz (83.4 kg) IBW/kg (Calculated) : 59.3  Temp (24hrs), Avg:97.5 F (36.4 C), Min:95.4 F (35.2 C), Max:99.5 F (37.5 C)   Recent Labs Lab 01/18/16 1328 01/17/2016 1537 01/16/2016 1858 01/15/2016 2344 01/20/16 0539  WBC 10.7 16.2*  --  26.3* 25.8*  CREATININE 0.74 0.87  --  1.12* 1.16*  LATICACIDVEN  --  8.8* 2.6*  --   --     Estimated Creatinine Clearance: 54 mL/min (by C-G formula based on SCr of 1.16 mg/dL (H)).    Allergies  Allergen Reactions  . Dexlansoprazole Nausea And Vomiting  . Ultram [Tramadol] Itching    Antimicrobials this admission: Vancomycin 1/15 >> 1/16 Zosyn 1/15 >>   Dose adjustments this admission: N/A  Microbiology results: 1/15 BCx: NGTD 1/15 UCx: pending  1/15 Culture, Respiratory (tracheal aspirate): pending  1/15 MRSA PCR: negative   Thank you for allowing pharmacy to be a part of this patient's care.  Loree Fee, PharmD 01/20/2016 12:23 PM

## 2016-01-20 NOTE — Progress Notes (Signed)
eLink Physician-Brief Progress Note Patient Name: Dawn Donaldson DOB: [birth year] MRN: [id number]   Date of Service  01/20/2016  HPI/Events of Note  ABG reviewed autoPEEP on camera check  eICU Interventions  Drop RR to 20     Intervention Category Intermediate Interventions: Diagnostic test evaluation  Zayne Draheim V. 01/20/2016, 7:09 PM

## 2016-01-20 NOTE — Progress Notes (Signed)
ELink informed of ABG results, RN will inform MD on site to see if any changes are warranted

## 2016-01-20 NOTE — Progress Notes (Signed)
Dr Juanell Fairly informed of hypertension, hypoactive bowel sounds, troponin .26.  He will order something for pressure, no other orders at this time

## 2016-01-20 NOTE — Progress Notes (Signed)
Vent monitored during shift. Several rate changes made per abg per NP Hinton Dyer B.

## 2016-01-20 NOTE — Therapy (Signed)
Attempted PRVC per Dr. Lovett Sox order. Vent set at Vt 500ccc/ RR 24/FiO2 .30/PEEP +5. Patient became agitated with increasing PiP consistently above 55 cmH2O. Vent mode back on PCV at previous settings. Patient somewhat less agitated, nurse at bedside preparing sedation. Dr. Juanell Fairly notified of same. Plan to retry [facility] after propofol is started.

## 2016-01-20 NOTE — Progress Notes (Signed)
Pt has been on a sodium bicarb gtt, NS, and Fentanyl. Epi gtt discontinued since BP elevated. Not responsive except to painful stimuli. BP has been up and down this shift. Sats good on the vent and afebrile. Dr. Brunetta Genera, pt PCP called to check on pt since pt coded outside MD office.

## 2016-01-20 NOTE — Progress Notes (Signed)
Pt turned this shift, femoral CL pulled this afternoon, PICC placed in right arm, ECG confirmed by [employer], pt minimally responsive, does not follow commands, but did become agitated and bite down on tube several times, fentanyl changed to diprivan and diprivan titrated to 20 mcgs.min.  No stoll this shift, UOP WDL.  Chest tube output 35 ccs, serosanguinous.  Family taking turns at bedside all shift.  Very cooperative with staff.

## 2016-01-20 NOTE — Progress Notes (Signed)
Initial Nutrition Assessment  DOCUMENTATION CODES:   Not applicable  INTERVENTION:  Tube Feeding: received verbal order from MD Ramachandran to begin TF. Recommend Vital High Protein at rate of 60 ml/hr providing 1440 kcals, 127 g of protein and 1210 mL of free water. With D5 infusion, meets 100% estimated calorie and protein needs. Diprivan to be started, will reassess goal rate on follow. Electrolytes being supplemented today  NUTRITION DIAGNOSIS:   Inadequate oral intake related to acute illness as evidenced by NPO status. GOAL:   Provide needs based on ASPEN/SCCM guidelines  MONITOR:   Vent status, Labs, Weight trends, TF tolerance  REASON FOR ASSESSMENT:   Ventilator    ASSESSMENT:   66 yo female admitted with acute respiratory failure, shock s/p cardiac arrest, ARF. Pt with hx of CAD, COPD, CVA, CHF, HTN, PVD, hiatal hernia  Patient is currently intubated on ventilator support MV: 15 L/min Temp (24hrs), Avg:97.5 F (36.4 C), Min:95.4 F (35.2 C), Max:99.5 F (37.5 C)  Propofol: transitioning  Left pneumothorax s/p chest tube placement  Labs: potassium 3.4, phosphorus 1.6, magnesium 1.4; FSBS 128-267 Meds: ss novolog, lantus, D5-sodium bicarb at 125 ml/hr (510 kcals), diprivan, fentanyl, versed  Diet Order:   NPO  Skin:  Reviewed, no issues  Last BM:  no documented BM  Height:   Ht Readings from Last 1 Encounters:  01/05/2016 5\' 6"  (1.676 m)    Weight:   Wt Readings from Last 1 Encounters:  01/20/16 183 lb 13.8 oz (83.4 kg)    BMI:  Body mass index is 29.68 kg/m.  Estimated Nutritional Needs:   Kcal:  [id number] kcals   Protein:  125-166 g  Fluid:  >/= 1.8 L  EDUCATION NEEDS:   No education needs identified at this time  [place], [place], [place] [phone] Pager  [phone] Weekend/On-Call Pager

## 2016-01-20 NOTE — Progress Notes (Signed)
[facility] Consultation     ASSESSMENT/PLAN   Acute cardiac arrest of uncertain etiology.   PULMONARY A: Acute hypoxic and hypercapnic resp failure secondary Viral Bronchitis  Left pneumothorax secondary to displaced fractures of the left lateral fourth through eighth ribs Mechanical Intubation Hx: Obesity P:   Full vent support wean as tolerated  Continue bronchodilator therapy CXR and ABG in am  Left chest tube in place with air leak noted.   CARDIOVASCULAR A:  Shock with PEA cardiac arrest likely secondary to respiratory arrest  Hx: HTN, Hyperlipidemia, Hypercholesteremia, Diastolic CHF, Anginal Pain, Peripheral Vascular Disease, and CAD  P:  Continuous telemetry monitoring Trend troponin's  Echo pending  Epi drip to maintain map >65 wean as tolerated   RENAL A:   Metabolic and respiratory acidosis-resolved  Acute renal failure P:   Trend BMP Consider discontinuing Sodium Bicarb gtt Replace electrolytes as indicated Monitor UOP  GASTROINTESTINAL A:   Hx: GERD  P:   Continue Pepcid for GERD  start tube feeds  HEMATOLOGIC A:   Leukocytosis Mild Anemia  P:  Trend CBC Lovenox for VTE prophylaxis Monitor for s/sx of bleeding Transfuse for hgb <7   INFECTIOUS A:   Suspect viral bronchitis P:   Trend WBC and monitor fever curve Continue empiric tamiflu.  Continue abx as listed below consider d/c vancomycin MRSA PCR negative  Follow cultures   Micro/culture results: BCx2 01/15>> UC 01/15>> Sputum>> MRSA screen negative.  Flu 1/15 negative.   Antibiotics: 1/15 Zosyn >> 1/15 Vancomycin >> 1/16 1/15 Tamiflu >>  ENDOCRINE A: Hyperglycemia  P: Continue SSI   NEUROLOGIC A:   Metabolic and anoxic encephalopathy Hx: Chronic lower back pain  P: Maintain RASS 0 to -1 Continue fentanyl gtt and prn versed and fentanyl to maintain RASS goal Prn fentanyl for pain management WUA daily Lights on during the  day Frequent reorientation Promote family presence at bedside    MAJOR EVENTS/TEST RESULTS: CT Chest 01/15>>Left-sided pneumothorax, moderate in size, most prominent at the left lung base. Associated subcutaneous emphysema within the chest walls bilaterally. Displaced fractures of the left lateral fourth through eighth Ribs. Extensive free intraperitoneal air within the upper abdomen. CT abdomen and pelvis is recommended to exclude bowel perforation. This free intraperitoneal air could conceivably be due to a focal diaphragmatic disruption/laceration, not discretely seen on this chest CT and less likely than intra-abdominal viscus perforation. Aortic atherosclerosis. Coronary artery calcifications, particularly dense within the left anterior descending and circumflex coronary arteries. CT Abd Pelvis 01/15>>Large amount of free intraperitoneal air within the abdomen and pelvis. This is of uncertain etiology. The bowel is unremarkable and there is no definite bowel perforation identified. I favor that this free intraperitoneal air has dissected down from the intrathoracic cavity with accentuation by recent CPR. Small residual pneumothorax at the left lung base, decreased after chest tube placement. Large amount of associated subcutaneous emphysema within the superficial soft tissues of the chest and abdominal walls. Aortic atherosclerosis.   ---------------------------------------  ---------------------------------------   Name: Dawn Donaldson MRN: [id number] DOB: [birth year]    ADMISSION DATE:  01/25/2016  Subjective:  Dawn Donaldson on vent, moves extrem but does not follow commands.   REVIEW OF SYSTEMS:   Unable to assess Dawn Donaldson intubated  VITAL SIGNS: Temp:  [95.4 F (35.2 C)-98.8 F (37.1 C)] 98.8 F (37.1 C) (01/16 0441) Pulse Rate:  [87-125] 104 (01/16 0441) Resp:  [18-37] 24 (01/16 0441) BP: (63-249)/(48-233) 104/85 (01/16 0441) SpO2:  [87 %-100 %]  100 % (01/16 0441) FiO2 (%):  [30 %-50 %]  30 % (01/16 0500) Weight:  [75.3 kg (166 lb)-81.6 kg (180 lb)] 81.2 kg (179 lb 0.2 oz) (01/15 2000) HEMODYNAMICS:   VENTILATOR SETTINGS: Vent Mode: PCV FiO2 (%):  [30 %-50 %] 30 % Set Rate:  [16 bmp-26 bmp] 24 bmp Vt Set:  [500 mL] 500 mL PEEP:  [5 cmH20-7 cmH20] 5 cmH20 Plateau Pressure:  [38 cmH20] 38 cmH20 INTAKE / OUTPUT:  Intake/Output Summary (Last 24 hours) at 01/20/16 0508 Last data filed at 01/20/16 0400  Gross per 24 hour  Intake          3516.81 ml  Output              250 ml  Net          3266.81 ml    Physical Examination:   VS: BP 104/85   Pulse (!) 104   Temp 98.8 F (37.1 C)   Resp (!) 24   Ht 5\' 6"  (1.676 m)   Wt 81.2 kg (179 lb 0.2 oz)   SpO2 100%   BMI 28.89 kg/m   General Appearance: acutely ill appearing Caucasian female  Neuro: sedated withdraws from painful stimulation, PERRL  HEENT: supple, no JVD Pulmonary: diminished throughout, even, non labored, left chest tube in place, mechanically intubated; bilat wheezing.  Cardiovascular: NSR, s1s2, no M/R/G  Abdomen: +BS x4, benign, soft, non-tender, non-distended. Skin:   warm, no rashes or lesions  Extremities: normal bulk and tone    LABS: Reviewed   LABORATORY PANEL:   CBC  Recent Labs Lab 01/22/2016 2344  WBC 26.3*  HGB 11.7*  HCT 35.9  PLT 182    Chemistries   Recent Labs Lab 01/08/2016 2344  NA 142  K 3.9  CL 107  CO2 26  GLUCOSE 322*  BUN 23*  CREATININE 1.12*  CALCIUM 6.8*     Recent Labs Lab 01/29/2016 1550 01/13/2016 2303 01/20/16 0446  GLUCAP 250* 215* 267*    Recent Labs Lab 01/08/2016 1537 01/25/2016 2118 02/03/2016 2330  PHART 6.90* 7.24* 7.46*  PCO2ART 105* 61* 31*  PO2ART 173* 235* 176*   No results for input(s): AST, ALT, ALKPHOS, BILITOT, ALBUMIN in the last 168 hours.  Cardiac Enzymes  Recent Labs Lab 01/17/2016 1537  TROPONINI 0.04*    RADIOLOGY:  Ct Abdomen Pelvis Wo Contrast  Result Date: 01/24/2016 CLINICAL DATA:  Unresponsive, cardiac  arrest. Free intraperitoneal air. Sepsis. EXAM: CT ABDOMEN AND PELVIS WITHOUT CONTRAST TECHNIQUE: Multidetector CT imaging of the abdomen and pelvis was performed following the standard protocol without IV contrast. COMPARISON:  None. FINDINGS: Lower chest: Small residual pneumothorax at the left lung base after chest tube placement. Extensive subcutaneous air within the lower chest walls bilaterally. Hepatobiliary: Status post cholecystectomy. No focal liver abnormality. No bile duct dilatation. Pancreas: Unremarkable. No pancreatic ductal dilatation or surrounding inflammatory changes. Spleen: Normal in size without focal abnormality. Adrenals/Urinary Tract: Adrenal glands are unremarkable. 1 mm nonobstructing left renal stone. No hydronephrosis bilaterally. No ureteral or bladder calculi identified. Bladder decompressed by Foley catheter. Stomach/Bowel: Bowel is normal in caliber. Scattered diverticulosis within the descending and sigmoid colon but no evidence of acute diverticulitis. Stomach is unremarkable, partially decompressed by nasogastric tube. Vascular/Lymphatic: Aortic atherosclerosis. No enlarged lymph nodes seen. Reproductive: Status post hysterectomy. Other: Large amount of free intraperitoneal air within the abdomen and pelvis, uncertain source. Musculoskeletal: Large amount of extra abdominal air within the superficial soft tissues of the abdominal and  pelvic walls. Degenerative changes throughout the thoracolumbar spine. No acute or suspicious osseous finding in the abdomen or pelvis. IMPRESSION: 1. Large amount of free intraperitoneal air within the abdomen and pelvis. This is of uncertain etiology. The bowel is unremarkable and there is no definite bowel perforation identified. I favor that this free intraperitoneal air has dissected down from the intrathoracic cavity with accentuation by recent CPR. 2. Small residual pneumothorax at the left lung base, decreased after chest tube placement. 3.  Large amount of associated subcutaneous emphysema within the superficial soft tissues of the chest and abdominal walls. 4. Aortic atherosclerosis. Additional chronic/incidental findings detailed above. Electronically Signed   By: Franki Cabot M.D.   On: 01/20/2016 20:13   Dg Chest 1 View  Result Date: 01/15/2016 CLINICAL DATA:  Left-sided pneumothorax with chest tube placement. EXAM: CHEST 1 VIEW COMPARISON:  CT of the chest and CXR from 01/05/2016 FINDINGS: New left-sided chest tube is seen with tip coiled along the medial aspect of the left lung base. No radiographically significant pneumothorax identified. Lungs are well expanded. Extensive adjacent subcutaneous emphysema is again seen. No mediastinal widening. Heart is normal in size. No mediastinal shift. Gastric tube tip extends below the left hemidiaphragm, side-port not well visualized but likely near the GE junction. Free intraperitoneal air again noted. Endotracheal tube tip is approximately 6.8 cm above the carina. Left lateral rib fractures involving the fourth through eighth ribs are not well visualized radiographically. IMPRESSION: New left-sided chest tube in place without appreciable pneumothorax seen. Satisfactory endotracheal tube position 6.8 cm above the carina. Gastric tube tip extends below the left hemidiaphragm. Side-port likely at the GE junction. Slight further advancement may prove useful. Small amount of free intraperitoneal air is seen. Electronically Signed   By: Ashley Royalty M.D.   On: 01/07/2016 18:33   Dg Chest 2 View  Result Date: 01/18/2016 CLINICAL DATA:  Cough with fever for 2-3 days, history COPD, asthma, smoking, coronary artery disease post stenting, CHF, prior stroke EXAM: CHEST  2 VIEW COMPARISON:  12/27/2015 FINDINGS: Normal heart size, mediastinal contours, and pulmonary vascularity. Atherosclerotic calcification aorta. Lungs minimally hyperinflated but clear. No infiltrate, pleural effusion or pneumothorax. Bones  demineralized. IMPRESSION: No acute abnormalities. Electronically Signed   By: Lavonia Dana M.D.   On: 01/18/2016 14:15   Ct Chest Wo Contrast  Result Date: 01/06/2016 CLINICAL DATA:  Unresponsive at doctor's office at approximately 1500 hours with CPR started. History of COPD. EXAM: CT CHEST WITHOUT CONTRAST TECHNIQUE: Multidetector CT imaging of the chest was performed following the standard protocol without IV contrast. COMPARISON:  Chest x-ray from earlier same day. Chest CT dated 08/11/2015. FINDINGS: Cardiovascular: Heart size is normal. No pericardial effusion. Coronary artery calcifications noted. Scattered atherosclerotic changes along the walls of the normal- caliber thoracic aorta. Mediastinum/Nodes: No mass or enlarged lymph nodes seen within the mediastinum or perihilar regions. Enteric tube in place. Endotracheal tube well positioned with tip above the level of the carina. Lungs/Pleura: Left-sided pneumothorax, moderate in size, most prominent at the left lung base. Right lung is clear. Mild emphysematous changes noted at each lung apex. Upper Abdomen: Extensive free intraperitoneal air within the upper abdomen. Musculoskeletal: Displaced fractures of the left lateral fourth through eighth ribs. Extensive subcutaneous emphysema within the chest walls bilaterally and at throughout the soft tissues of the upper abdomen (anterior and lateral aspects bilaterally). IMPRESSION: 1. Left-sided pneumothorax, moderate in size, most prominent at the left lung base. Associated subcutaneous emphysema within the chest walls  bilaterally. 2. Displaced fractures of the left lateral fourth through eighth ribs. 3. Extensive free intraperitoneal air within the upper abdomen. CT abdomen and pelvis is recommended to exclude bowel perforation. This free intraperitoneal air could conceivably be due to a focal diaphragmatic disruption/laceration, not discretely seen on this chest CT and less likely than intra-abdominal  viscus perforation. 4. Aortic atherosclerosis. 5. Coronary artery calcifications, particularly dense within the left anterior descending and circumflex coronary arteries. Critical Value/emergent results and recommendations were called by telephone at the time of interpretation on 01/12/2016 at 5:27 pm to Dr. Archie Balboa, who verbally acknowledged these results. Electronically Signed   By: Franki Cabot M.D.   On: 01/30/2016 17:33   Dg Chest Portable 1 View  Result Date: 01/12/2016 CLINICAL DATA:  ET tube placement. EXAM: PORTABLE CHEST 1 VIEW COMPARISON:  01/16/2016. FINDINGS: Again evaluation limited by trauma board. Endotracheal tube tip has been withdrawn and appears to be approximately 3 cm above the carina. NG tube noted, and its tip is not definitely identified and may be within the esophagus. Heart size normal. Probable left-sided pneumothorax. Possibly with tension as there is shift of the mediastinum to the right. Left chest wall subcutaneous emphysema. IMPRESSION: 1. Endotracheal tube tip has been withdrawn, its tip is approximately 3 cm above the carina. NG tube appears to be present, its tip is not identified and may be in the esophagus. 2. Probable pneumothorax, with possible tension. Left chest wall subcutaneous emphysema. Critical Value/emergent results were called by telephone at the time of interpretation on 01/05/2016 at 4:50 pm to Dr. Archie Balboa, who verbally acknowledged these results. Electronically Signed   By: Marcello Moores  Register   On: 02/04/2016 16:57   Dg Chest Portable 1 View  Result Date: 01/28/2016 CLINICAL DATA:  66 year old hypertensive female became unresponsive. Post cardiac arrest and CPR. Subsequent encounter. EXAM: PORTABLE CHEST 1 VIEW COMPARISON:  01/18/2016 chest x-ray. FINDINGS: Evaluation limited by trauma board. Complete chest not included on present exam. Endotracheal tube is in place. The tip appears to be 1 cm above the carina. Recommend retracting by 2 cm. No obvious  pneumothorax, infiltrate or pulmonary edema. Heart size within normal limits. IMPRESSION: Evaluation limited by trauma board. Complete chest not included on present exam. Endotracheal tube is in place. The tip appears to be 1 cm above the carina. Recommend retracting by 2 cm. No obvious pneumothorax, infiltrate or pulmonary edema. It would be helpful to obtain a follow-up exam off trauma board. Electronically Signed   By: Genia Del M.D.   On: 01/24/2016 16:11   Update: family updated at bedside (daughter and grand daughter).   Marda Stalker, m.D.  01/20/2016  Critical Care Attestation.  I have personally obtained a history, examined the patient, evaluated laboratory and imaging results, formulated the assessment and plan and placed orders. The Patient requires high complexity decision making for assessment and support, frequent evaluation and titration of therapies, application of advanced monitoring technologies and extensive interpretation of multiple databases. The patient has critical illness that could lead imminently to failure of 1 or more organ systems and requires the highest level of physician preparedness to intervene.  Critical Care Time devoted to patient care services described in this note is 45 minutes and is exclusive of time spent in procedures.

## 2016-01-20 NOTE — Progress Notes (Addendum)
Pharmacist- Physician Communication   Tamiflu ordered for 75mg  PO BID. Patients Crcl 64ml/min. Will change to Tamiflu 30mg  BID.   Loree Fee, PharmD 11:30 AM 01/20/2016

## 2016-01-21 ENCOUNTER — Inpatient Hospital Stay: Payer: Medicare Other

## 2016-01-21 DIAGNOSIS — G931 Anoxic brain damage, not elsewhere classified: Secondary | ICD-10-CM

## 2016-01-21 LAB — ALBUMIN: Albumin: 2.6 g/dL — ABNORMAL LOW (ref 3.5–5.0)

## 2016-01-21 LAB — CBC
HEMATOCRIT: 30.5 % — AB (ref 35.0–47.0)
HEMOGLOBIN: 10.2 g/dL — AB (ref 12.0–16.0)
MCH: 33 pg (ref 26.0–34.0)
MCHC: 33.5 g/dL (ref 32.0–36.0)
MCV: 98.4 fL (ref 80.0–100.0)
Platelets: 134 10*3/uL — ABNORMAL LOW (ref 150–440)
RBC: 3.1 MIL/uL — AB (ref 3.80–5.20)
RDW: 13.9 % (ref 11.5–14.5)
WBC: 22.8 10*3/uL — ABNORMAL HIGH (ref 3.6–11.0)

## 2016-01-21 LAB — BLOOD GAS, ARTERIAL
Acid-Base Excess: 8.3 mmol/L — ABNORMAL HIGH (ref 0.0–2.0)
Bicarbonate: 34.5 mmol/L — ABNORMAL HIGH (ref 20.0–28.0)
FIO2: 0.3
LHR: 20 {breaths}/min
MECHVT: 450 mL
O2 Saturation: 94 %
PATIENT TEMPERATURE: 37
PEEP: 5 cmH2O
PO2 ART: 69 mmHg — AB (ref 83.0–108.0)
pCO2 arterial: 52 mmHg — ABNORMAL HIGH (ref 32.0–48.0)
pH, Arterial: 7.43 (ref 7.350–7.450)

## 2016-01-21 LAB — PHOSPHORUS
PHOSPHORUS: 2.1 mg/dL — AB (ref 2.5–4.6)
Phosphorus: 3 mg/dL (ref 2.5–4.6)

## 2016-01-21 LAB — GLUCOSE, CAPILLARY
GLUCOSE-CAPILLARY: 116 mg/dL — AB (ref 65–99)
GLUCOSE-CAPILLARY: 103 mg/dL — AB (ref 65–99)
Glucose-Capillary: 111 mg/dL — ABNORMAL HIGH (ref 65–99)
Glucose-Capillary: 117 mg/dL — ABNORMAL HIGH (ref 65–99)
Glucose-Capillary: 130 mg/dL — ABNORMAL HIGH (ref 65–99)
Glucose-Capillary: 100 mg/dL — ABNORMAL HIGH (ref 65–99)
Glucose-Capillary: 161 mg/dL — ABNORMAL HIGH (ref 65–99)

## 2016-01-21 LAB — BASIC METABOLIC PANEL
ANION GAP: 5 (ref 5–15)
BUN: 24 mg/dL — ABNORMAL HIGH (ref 6–20)
CO2: 32 mmol/L (ref 22–32)
Calcium: 7.2 mg/dL — ABNORMAL LOW (ref 8.9–10.3)
Chloride: 103 mmol/L (ref 101–111)
Creatinine, Ser: 0.92 mg/dL (ref 0.44–1.00)
GLUCOSE: 118 mg/dL — AB (ref 65–99)
POTASSIUM: 3.3 mmol/L — AB (ref 3.5–5.1)
Sodium: 140 mmol/L (ref 135–145)

## 2016-01-21 LAB — RAPID HIV SCREEN (HIV 1/2 AB+AG)
HIV 1/2 Antibodies: NONREACTIVE
HIV-1 P24 ANTIGEN - HIV24: NONREACTIVE

## 2016-01-21 LAB — URINE CULTURE: CULTURE: NO GROWTH

## 2016-01-21 LAB — MAGNESIUM
MAGNESIUM: 2.3 mg/dL (ref 1.7–2.4)
MAGNESIUM: 2.1 mg/dL (ref 1.7–2.4)

## 2016-01-21 MED ORDER — POTASSIUM CHLORIDE CRYS ER 20 MEQ PO TBCR: 40.0000 meq | EXTENDED_RELEASE_TABLET | Freq: Once | ORAL | Status: DC

## 2016-01-21 MED ORDER — FAMOTIDINE 40 MG/5ML PO SUSR
20.0000 mg | Freq: Two times a day (BID) | ORAL | Status: DC
  Administered 2016-01-21 – 2016-01-26 (×10): 20 mg
  Filled 2016-01-21 (×11): qty 2.5

## 2016-01-21 MED ORDER — OSELTAMIVIR PHOSPHATE 75 MG PO CAPS
75.0000 mg | ORAL_CAPSULE | Freq: Two times a day (BID) | ORAL | Status: AC
  Administered 2016-01-21 – 2016-01-24 (×7): 75 mg via ORAL
  Filled 2016-01-21 (×7): qty 1

## 2016-01-21 MED ORDER — PRO-STAT SUGAR FREE PO LIQD
30.0000 mL | Freq: Two times a day (BID) | ORAL | Status: DC
  Administered 2016-01-21 – 2016-01-22 (×3): 30 mL via ORAL

## 2016-01-21 MED ORDER — ACETAMINOPHEN 325 MG PO TABS
650.0000 mg | ORAL_TABLET | Freq: Four times a day (QID) | ORAL | Status: DC | PRN
  Administered 2016-01-21: 650 mg via ORAL

## 2016-01-21 MED ORDER — SODIUM CHLORIDE 0.9 % IJ SOLN
INTRAMUSCULAR | Status: AC
Start: 2016-01-21 — End: 2016-01-21
  Filled 2016-01-21: qty 10

## 2016-01-21 MED ORDER — SCOPOLAMINE 1 MG/3DAYS TD PT72
1.0000 | MEDICATED_PATCH | TRANSDERMAL | Status: DC
  Administered 2016-01-22 – 2016-01-24 (×2): 1.5 mg via TRANSDERMAL
  Filled 2016-01-21 (×2): qty 1

## 2016-01-21 MED ORDER — SODIUM CHLORIDE 0.9 % IV SOLN
30.0000 meq | Freq: Once | INTRAVENOUS | Status: AC
  Administered 2016-01-21: 30 meq via INTRAVENOUS
  Filled 2016-01-21: qty 15

## 2016-01-21 MED ORDER — SENNOSIDES 8.8 MG/5ML PO SYRP
5.0000 mL | ORAL_SOLUTION | Freq: Two times a day (BID) | ORAL | Status: DC
  Administered 2016-01-21 – 2016-01-23 (×5): 5 mL via ORAL
  Filled 2016-01-21 (×5): qty 5

## 2016-01-21 MED ORDER — DOCUSATE SODIUM 50 MG/5ML PO LIQD
100.0000 mg | Freq: Two times a day (BID) | ORAL | Status: DC
  Administered 2016-01-21 – 2016-01-23 (×5): 100 mg
  Filled 2016-01-21 (×5): qty 10

## 2016-01-21 MED ORDER — METHYLPREDNISOLONE SODIUM SUCC 125 MG IJ SOLR
60.0000 mg | Freq: Three times a day (TID) | INTRAMUSCULAR | Status: DC
  Administered 2016-01-21 – 2016-01-26 (×15): 60 mg via INTRAVENOUS
  Filled 2016-01-21 (×15): qty 2

## 2016-01-21 MED ORDER — ACETAMINOPHEN 650 MG RE SUPP: 650.0000 mg | RECTAL | Status: DC | PRN

## 2016-01-21 NOTE — Progress Notes (Signed)
Pharmacist- Physician Communication   Patients CrCL has improved to 68.7ml/min will change Tamiflu back to 75mg  PO BID.   Loree Fee, PharmD 8:19 AM 01/21/2016

## 2016-01-21 NOTE — Progress Notes (Signed)
Per Dr Bernita Buffy his NP at his practice did mouth to mouth on patient; patient's lip has small cut.  He requests HIV and Hep C tests on patient to satisfy OSHA requirements.  Order per Dr Juanell Fairly.

## 2016-01-21 NOTE — Progress Notes (Signed)
Pharmacy  Note  Dawn Donaldson is a 66 y.o. female with a h/o COPD and CHF admitted on 02/02/2016 after cardiac arrest with pneumonia and sepsis.  Pharmacy has been consulted for Zosyn dosing and electrolyte monitoring.   Plan: 1. Antibiotics: Continue Zosyn 3.375g IV q8h (4 hour infusion)  2. Electrolytes: KCl 30 meq iv x 1. Will follow-up with labs in the AM.    Height: 5\' 6"  (167.6 cm) Weight: 186 lb 15.2 oz (84.8 kg) IBW/kg (Calculated) : 59.3  Temp (24hrs), Avg:99.6 F (37.6 C), Min:97.9 F (36.6 C), Max:100.2 F (37.9 C)   Recent Labs Lab 01/18/16 1328 01/17/2016 1537 02/02/2016 1858 01/29/2016 2344 01/20/16 0539 01/21/16 0533  WBC 10.7 16.2*  --  26.3* 25.8* 22.8*  CREATININE 0.74 0.87  --  1.12* 1.16* 0.92  LATICACIDVEN  --  8.8* 2.6*  --   --   --     Estimated Creatinine Clearance: 68.7 mL/min (by C-G formula based on SCr of 0.92 mg/dL).    Allergies  Allergen Reactions  . Dexlansoprazole Nausea And Vomiting  . Ultram [Tramadol] Itching    Antimicrobials this admission: Vancomycin 1/15 >> 1/16 Zosyn 1/15 >>   Dose adjustments this admission: N/A  Microbiology results: 1/15 BCx: NGTD 1/15 UCx: pending  1/15 Culture, Respiratory (tracheal aspirate): pending  1/15 MRSA PCR: negative   Thank you for allowing pharmacy to be a part of this patient's care.  Napoleon Form, PharmD 01/21/2016 1:57 PM

## 2016-01-21 NOTE — Progress Notes (Signed)
[facility] Consultation     ASSESSMENT/PLAN   Acute cardiac arrest of uncertain etiology.   PULMONARY A: Acute hypoxic and hypercapnic resp failure secondary Viral Bronchitis-- AECOPD.  Left pneumothorax secondary to displaced fractures of the left lateral fourth through eighth ribs CXR images reviewed; resolution of ptx, left chest tube in place.  Vent Settings: 30/5/20/450 ABG 7.43/52/69/34.5; c/w compensated hypercapnic respiratory failure.  Hx: Obesity Subcutaneous Emphysema P:   Full vent support wean as tolerated, not yet ready for extubation due to diminished mental status.  Continue bronchodilator therapy CXR and ABG in am  Left chest tube in place on suction, no air leak noted today.  Rx with steroids.    CARDIOVASCULAR A:  Shock with PEA cardiac arrest likely secondary to respiratory arrest  Hx: HTN, Hyperlipidemia, Hypercholesteremia, Diastolic CHF, Anginal Pain, Peripheral Vascular Disease, and CAD  P:  Continuous telemetry monitoring Trend troponin's  Echo pending  Epi gtt to Maintain goal>65, patient is currently not any pressors.  RENAL A:   Metabolic and respiratory acidosis-resolved  Acute Kidney Injury P:   Trend BMP Replace electrolytes as indicated Monitor UOP  GASTROINTESTINAL A:   Hx: GERD  P:   Continue Pepcid for GERD Continue tube feeding  HEMATOLOGIC A:   Leukocytosis-trending down Mild Anemia  P:  Trend CBC Lovenox for VTE prophylaxis Monitor for s/sx of bleeding Transfuse for hgb <7   INFECTIOUS A:   Suspect viral bronchitis P:   Trend WBC and monitor fever curve Continue empiric tamiflu.  Continue abx as listed below  Follow cultures   Micro/culture results: BCx2 01/15>> UC 01/15>> Sputum>> MRSA screen negative.  Flu 1/15 negative.   Antibiotics: 1/15 Zosyn >> 1/15 Vancomycin >> 1/16 1/15 Tamiflu >>  ENDOCRINE A: Hyperglycemia  P: Continue SSI   NEUROLOGIC A:   Metabolic  and anoxic encephalopathy Hx: Chronic lower back pain  P: Maintain RASS 0 to -1 Continue fentanyl gtt and prn versed and fentanyl to maintain RASS goal Prn fentanyl for pain management WUA daily Lights on during the day Frequent reorientation    MAJOR EVENTS/TEST RESULTS: CT Chest 01/15>>Left-sided pneumothorax, moderate in size, most prominent at the left lung base. Associated subcutaneous emphysema within the chest walls bilaterally. Displaced fractures of the left lateral fourth through eighth Ribs. Extensive free intraperitoneal air within the upper abdomen. CT abdomen and pelvis is recommended to exclude bowel perforation. This free intraperitoneal air could conceivably be due to a focal diaphragmatic disruption/laceration, not discretely seen on this chest CT and less likely than intra-abdominal viscus perforation. Aortic atherosclerosis. Coronary artery calcifications, particularly dense within the left anterior descending and circumflex coronary arteries. CT Abd Pelvis 01/15>>Large amount of free intraperitoneal air within the abdomen and pelvis. This is of uncertain etiology. The bowel is unremarkable and there is no definite bowel perforation identified. I favor that this free intraperitoneal air has dissected down from the intrathoracic cavity with accentuation by recent CPR. Small residual pneumothorax at the left lung base, decreased after chest tube placement. Large amount of associated subcutaneous emphysema within the superficial soft tissues of the chest and abdominal walls. Aortic atherosclerosis.   ---------------------------------------  ---------------------------------------   Name: Renay Sherouse MRN: [id number] DOB: 1950-01-12    ADMISSION DATE:  01/10/2016  Subjective:  Patient does not follow any commands.  Remains on vent.  No acute events overnight.  REVIEW OF SYSTEMS:   Unable to assess pt intubated  VITAL SIGNS: Temp:  [97.9 F (36.6 C)-99.9 F (  37.7  C)] 99.7 F (37.6 C) (01/17 0230) Pulse Rate:  [87-125] 102 (01/17 0230) Resp:  [13-37] 13 (01/17 0230) BP: (79-218)/(56-123) 118/88 (01/17 0230) SpO2:  [90 %-100 %] 96 % (01/17 0230) FiO2 (%):  [30 %] 30 % (01/17 0200) Weight:  [83.4 kg (183 lb 13.8 oz)] 83.4 kg (183 lb 13.8 oz) (01/16 0500) HEMODYNAMICS:   VENTILATOR SETTINGS: Vent Mode: PRVC FiO2 (%):  [30 %] 30 % Set Rate:  [20 bmp-24 bmp] 20 bmp Vt Set:  [450 mL] 450 mL PEEP:  [5 cmH20] 5 cmH20 INTAKE / OUTPUT:  Intake/Output Summary (Last 24 hours) at 01/21/16 0251 Last data filed at 01/21/16 0200  Gross per 24 hour  Intake          5434.48 ml  Output             1509 ml  Net          3925.48 ml    Physical Examination:   VS: BP 118/88   Pulse (!) 102   Temp 99.7 F (37.6 C)   Resp 13   Ht 5\' 6"  (1.676 m)   Wt 83.4 kg (183 lb 13.8 oz)   SpO2 96%   BMI 29.68 kg/m   General Appearance: acutely ill appearing Caucasian female  Neuro: sedated withdraws from painful stimulation, PERRL , non purposeful, increased agigation when decreasing sedation.  HEENT: supple, no JVD Pulmonary: diminished throughout, even, non labored, left chest tube in place, mechanically intubated;  Cardiovascular: NSR, s1s2, no M/R/G  Abdomen: +BS x4, benign, soft, non-tender, non-distended. Skin:   warm, no rashes or lesions  Extremities: normal bulk and tone    LABS: Reviewed   LABORATORY PANEL:   CBC  Recent Labs Lab 01/20/16 0539  WBC 25.8*  HGB 11.8*  HCT 35.8  PLT 189    Chemistries   Recent Labs Lab 01/20/16 0539  01/20/16 1840  NA 143  --   --   K 3.4*  --   --   CL 106  --   --   CO2 27  --   --   GLUCOSE 292*  --   --   BUN 24*  --   --   CREATININE 1.16*  --   --   CALCIUM 6.8*  --   --   MG 1.4*  < > 2.3  PHOS 1.6*  < > 3.1  < > = values in this interval not displayed.   Recent Labs Lab 01/20/2016 2002 01/27/2016 2303 01/20/16 0446 01/20/16 0724 01/20/16 1103 01/20/16 1638  GLUCAP 194* 215*  267* 272* 128* 147*    Recent Labs Lab 01/28/2016 2330 01/20/16 0500 01/20/16 1700  PHART 7.46* 7.47* 7.42  PCO2ART 31* 35 52*  PO2ART 176* 136* 88   No results for input(s): AST, ALT, ALKPHOS, BILITOT, ALBUMIN in the last 168 hours.  Cardiac Enzymes  Recent Labs Lab 01/20/16 1848  TROPONINI 0.16*    RADIOLOGY:  Ct Abdomen Pelvis Wo Contrast  Result Date: 01/29/2016 CLINICAL DATA:  Unresponsive, cardiac arrest. Free intraperitoneal air. Sepsis. EXAM: CT ABDOMEN AND PELVIS WITHOUT CONTRAST TECHNIQUE: Multidetector CT imaging of the abdomen and pelvis was performed following the standard protocol without IV contrast. COMPARISON:  None. FINDINGS: Lower chest: Small residual pneumothorax at the left lung base after chest tube placement. Extensive subcutaneous air within the lower chest walls bilaterally. Hepatobiliary: Status post cholecystectomy. No focal liver abnormality. No bile duct dilatation. Pancreas: Unremarkable. No pancreatic ductal dilatation or  surrounding inflammatory changes. Spleen: Normal in size without focal abnormality. Adrenals/Urinary Tract: Adrenal glands are unremarkable. 1 mm nonobstructing left renal stone. No hydronephrosis bilaterally. No ureteral or bladder calculi identified. Bladder decompressed by Foley catheter. Stomach/Bowel: Bowel is normal in caliber. Scattered diverticulosis within the descending and sigmoid colon but no evidence of acute diverticulitis. Stomach is unremarkable, partially decompressed by nasogastric tube. Vascular/Lymphatic: Aortic atherosclerosis. No enlarged lymph nodes seen. Reproductive: Status post hysterectomy. Other: Large amount of free intraperitoneal air within the abdomen and pelvis, uncertain source. Musculoskeletal: Large amount of extra abdominal air within the superficial soft tissues of the abdominal and pelvic walls. Degenerative changes throughout the thoracolumbar spine. No acute or suspicious osseous finding in the abdomen  or pelvis. IMPRESSION: 1. Large amount of free intraperitoneal air within the abdomen and pelvis. This is of uncertain etiology. The bowel is unremarkable and there is no definite bowel perforation identified. I favor that this free intraperitoneal air has dissected down from the intrathoracic cavity with accentuation by recent CPR. 2. Small residual pneumothorax at the left lung base, decreased after chest tube placement. 3. Large amount of associated subcutaneous emphysema within the superficial soft tissues of the chest and abdominal walls. 4. Aortic atherosclerosis. Additional chronic/incidental findings detailed above. Electronically Signed   By: Franki Cabot M.D.   On: 02/03/2016 20:13   Dg Chest 1 View  Result Date: 01/20/2016 CLINICAL DATA:  Hypoxia EXAM: CHEST 1 VIEW COMPARISON:  January 19, 2016 chest radiograph and chest CT FINDINGS: Endotracheal tube tip is 4.4 cm above the carina. Nasogastric tube tip and side port in stomach. There remains extensive subcutaneous emphysema bilaterally. The pneumothorax on the left seen on CT 1 day prior is not well appreciated by radiography. No tension component is evident. Heart size and pulmonary vascular normal. No adenopathy. There is evidence of a degree of pneumoperitoneum, better seen on recent CT. IMPRESSION: Tube and catheter positions as described. Extensive subcutaneous emphysema. The pneumothorax seen on the left on CT 1 day prior is not well appreciated by radiography. Pneumoperitoneum is better seen on CT. Cardiac silhouette within normal limits. No lung edema or consolidation. Electronically Signed   By: Lowella Grip III M.D.   On: 01/20/2016 08:05   Dg Chest 1 View  Result Date: 01/12/2016 CLINICAL DATA:  Left-sided pneumothorax with chest tube placement. EXAM: CHEST 1 VIEW COMPARISON:  CT of the chest and CXR from 01/06/2016 FINDINGS: New left-sided chest tube is seen with tip coiled along the medial aspect of the left lung base. No  radiographically significant pneumothorax identified. Lungs are well expanded. Extensive adjacent subcutaneous emphysema is again seen. No mediastinal widening. Heart is normal in size. No mediastinal shift. Gastric tube tip extends below the left hemidiaphragm, side-port not well visualized but likely near the GE junction. Free intraperitoneal air again noted. Endotracheal tube tip is approximately 6.8 cm above the carina. Left lateral rib fractures involving the fourth through eighth ribs are not well visualized radiographically. IMPRESSION: New left-sided chest tube in place without appreciable pneumothorax seen. Satisfactory endotracheal tube position 6.8 cm above the carina. Gastric tube tip extends below the left hemidiaphragm. Side-port likely at the GE junction. Slight further advancement may prove useful. Small amount of free intraperitoneal air is seen. Electronically Signed   By: Ashley Royalty M.D.   On: 01/09/2016 18:33   Ct Chest Wo Contrast  Result Date: 01/27/2016 CLINICAL DATA:  Unresponsive at doctor's office at approximately 1500 hours with CPR started. History of COPD. EXAM:  CT CHEST WITHOUT CONTRAST TECHNIQUE: Multidetector CT imaging of the chest was performed following the standard protocol without IV contrast. COMPARISON:  Chest x-ray from earlier same day. Chest CT dated 08/11/2015. FINDINGS: Cardiovascular: Heart size is normal. No pericardial effusion. Coronary artery calcifications noted. Scattered atherosclerotic changes along the walls of the normal- caliber thoracic aorta. Mediastinum/Nodes: No mass or enlarged lymph nodes seen within the mediastinum or perihilar regions. Enteric tube in place. Endotracheal tube well positioned with tip above the level of the carina. Lungs/Pleura: Left-sided pneumothorax, moderate in size, most prominent at the left lung base. Right lung is clear. Mild emphysematous changes noted at each lung apex. Upper Abdomen: Extensive free intraperitoneal air  within the upper abdomen. Musculoskeletal: Displaced fractures of the left lateral fourth through eighth ribs. Extensive subcutaneous emphysema within the chest walls bilaterally and at throughout the soft tissues of the upper abdomen (anterior and lateral aspects bilaterally). IMPRESSION: 1. Left-sided pneumothorax, moderate in size, most prominent at the left lung base. Associated subcutaneous emphysema within the chest walls bilaterally. 2. Displaced fractures of the left lateral fourth through eighth ribs. 3. Extensive free intraperitoneal air within the upper abdomen. CT abdomen and pelvis is recommended to exclude bowel perforation. This free intraperitoneal air could conceivably be due to a focal diaphragmatic disruption/laceration, not discretely seen on this chest CT and less likely than intra-abdominal viscus perforation. 4. Aortic atherosclerosis. 5. Coronary artery calcifications, particularly dense within the left anterior descending and circumflex coronary arteries. Critical Value/emergent results and recommendations were called by telephone at the time of interpretation on 01/25/2016 at 5:27 pm to Dr. Archie Balboa, who verbally acknowledged these results. Electronically Signed   By: Franki Cabot M.D.   On: 01/20/2016 17:33   Dg Chest Portable 1 View  Result Date: 01/22/2016 CLINICAL DATA:  ET tube placement. EXAM: PORTABLE CHEST 1 VIEW COMPARISON:  01/25/2016. FINDINGS: Again evaluation limited by trauma board. Endotracheal tube tip has been withdrawn and appears to be approximately 3 cm above the carina. NG tube noted, and its tip is not definitely identified and may be within the esophagus. Heart size normal. Probable left-sided pneumothorax. Possibly with tension as there is shift of the mediastinum to the right. Left chest wall subcutaneous emphysema. IMPRESSION: 1. Endotracheal tube tip has been withdrawn, its tip is approximately 3 cm above the carina. NG tube appears to be present, its tip is  not identified and may be in the esophagus. 2. Probable pneumothorax, with possible tension. Left chest wall subcutaneous emphysema. Critical Value/emergent results were called by telephone at the time of interpretation on 01/18/2016 at 4:50 pm to Dr. Archie Balboa, who verbally acknowledged these results. Electronically Signed   By: Marcello Moores  Register   On: 01/23/2016 16:57   Dg Chest Portable 1 View  Result Date: 01/27/2016 CLINICAL DATA:  66 year old hypertensive female became unresponsive. Post cardiac arrest and CPR. Subsequent encounter. EXAM: PORTABLE CHEST 1 VIEW COMPARISON:  01/18/2016 chest x-ray. FINDINGS: Evaluation limited by trauma board. Complete chest not included on present exam. Endotracheal tube is in place. The tip appears to be 1 cm above the carina. Recommend retracting by 2 cm. No obvious pneumothorax, infiltrate or pulmonary edema. Heart size within normal limits. IMPRESSION: Evaluation limited by trauma board. Complete chest not included on present exam. Endotracheal tube is in place. The tip appears to be 1 cm above the carina. Recommend retracting by 2 cm. No obvious pneumothorax, infiltrate or pulmonary edema. It would be helpful to obtain a follow-up exam off trauma board.  Electronically Signed   By: Genia Del M.D.   On: 01/11/2016 16:11   Update: No family present at bedside.  Marda Stalker, M.D. 01/21/2016   Critical Care Attestation.  I have personally obtained a history, examined the patient, evaluated laboratory and imaging results, formulated the assessment and plan and placed orders. The Patient requires high complexity decision making for assessment and support, frequent evaluation and titration of therapies, application of advanced monitoring technologies and extensive interpretation of multiple databases. The patient has critical illness that could lead imminently to failure of 1 or more organ systems and requires the highest level of physician preparedness to  intervene.  Critical Care Time devoted to patient care services described in this note is 45 minutes and is exclusive of time spent in procedures.

## 2016-01-21 NOTE — Progress Notes (Signed)
PHARMACIST - PHYSICIAN COMMUNICATION  DR:   Ashby Dawes  CONCERNING: IV to Oral Route Change Policy  RECOMMENDATION: This patient is receiving famotidine by the intravenous route.  Based on criteria approved by the Pharmacy and Therapeutics Committee, the intravenous medication(s) is/are being converted to the equivalent oral dose form(s).   DESCRIPTION: These criteria include:  The patient is eating (either orally or via tube) and/or has been taking other orally administered medications for a least 24 hours  The patient has no evidence of active gastrointestinal bleeding or impaired GI absorption (gastrectomy, short bowel, patient on TNA or NPO).  If you have questions about this conversion, please contact the Pharmacy Department  []   [phone] )  [facility] [x]   [phone] )  [facility] []   [phone] )  [facility] []   [phone] )  [facility] []   [phone] )  [place], [facility] 01/21/2016 2:03 PM

## 2016-01-21 NOTE — Progress Notes (Signed)
Ice packs applied for febrile <101

## 2016-01-22 ENCOUNTER — Inpatient Hospital Stay: Payer: Medicare Other

## 2016-01-22 DIAGNOSIS — J1100 Influenza due to unidentified influenza virus with unspecified type of pneumonia: Secondary | ICD-10-CM

## 2016-01-22 LAB — CBC
HCT: 28.7 % — ABNORMAL LOW (ref 35.0–47.0)
HEMOGLOBIN: 9.6 g/dL — AB (ref 12.0–16.0)
MCH: 33.2 pg (ref 26.0–34.0)
MCHC: 33.6 g/dL (ref 32.0–36.0)
MCV: 98.8 fL (ref 80.0–100.0)
PLATELETS: 108 10*3/uL — AB (ref 150–440)
RBC: 2.9 MIL/uL — AB (ref 3.80–5.20)
RDW: 13.6 % (ref 11.5–14.5)
WBC: 18.4 10*3/uL — ABNORMAL HIGH (ref 3.6–11.0)

## 2016-01-22 LAB — BLOOD GAS, ARTERIAL
ACID-BASE EXCESS: 6.2 mmol/L — AB (ref 0.0–2.0)
Bicarbonate: 31.7 mmol/L — ABNORMAL HIGH (ref 20.0–28.0)
FIO2: 0.3
Mechanical Rate: 20
O2 SAT: 96.4 %
PCO2 ART: 50 mmHg — AB (ref 32.0–48.0)
PEEP/CPAP: 5 cmH2O
PH ART: 7.41 (ref 7.350–7.450)
PO2 ART: 84 mmHg (ref 83.0–108.0)
Patient temperature: 37
VT: 400 mL

## 2016-01-22 LAB — BASIC METABOLIC PANEL
ANION GAP: 3 — AB (ref 5–15)
BUN: 24 mg/dL — ABNORMAL HIGH (ref 6–20)
CALCIUM: 7.8 mg/dL — AB (ref 8.9–10.3)
CHLORIDE: 109 mmol/L (ref 101–111)
CO2: 31 mmol/L (ref 22–32)
CREATININE: 0.67 mg/dL (ref 0.44–1.00)
GFR calc non Af Amer: >60 mL/min (ref >60)
Glucose, Bld: 173 mg/dL — ABNORMAL HIGH (ref 65–99)
Potassium: 3.4 mmol/L — ABNORMAL LOW (ref 3.5–5.1)
Sodium: 143 mmol/L (ref 135–145)

## 2016-01-22 LAB — GLUCOSE, CAPILLARY
GLUCOSE-CAPILLARY: 133 mg/dL — AB (ref 65–99)
GLUCOSE-CAPILLARY: 127 mg/dL — AB (ref 65–99)
GLUCOSE-CAPILLARY: 147 mg/dL — AB (ref 65–99)
Glucose-Capillary: 117 mg/dL — ABNORMAL HIGH (ref 65–99)
Glucose-Capillary: 125 mg/dL — ABNORMAL HIGH (ref 65–99)
Glucose-Capillary: 142 mg/dL — ABNORMAL HIGH (ref 65–99)
Glucose-Capillary: 156 mg/dL — ABNORMAL HIGH (ref 65–99)
Glucose-Capillary: 179 mg/dL — ABNORMAL HIGH (ref 65–99)

## 2016-01-22 LAB — MAGNESIUM: MAGNESIUM: 2.1 mg/dL (ref 1.7–2.4)

## 2016-01-22 LAB — PHOSPHORUS: Phosphorus: 3.2 mg/dL (ref 2.5–4.6)

## 2016-01-22 MED ORDER — DEXTROSE 5 % IV SOLN
20.0000 mmol | Freq: Once | INTRAVENOUS | Status: AC
  Administered 2016-01-22: 20 mmol via INTRAVENOUS
  Filled 2016-01-22: qty 6.67

## 2016-01-22 MED ORDER — DEXMEDETOMIDINE HCL IN NACL 400 MCG/100ML IV SOLN
0.0000 ug/kg/h | INTRAVENOUS | Status: AC
  Administered 2016-01-22: 0.4 ug/kg/h via INTRAVENOUS
  Administered 2016-01-22: 0.1 ug/kg/h via INTRAVENOUS
  Administered 2016-01-23: 0.5 ug/kg/h via INTRAVENOUS
  Administered 2016-01-23 – 2016-01-24 (×2): 0.7 ug/kg/h via INTRAVENOUS
  Administered 2016-01-24 (×2): 0.9 ug/kg/h via INTRAVENOUS
  Administered 2016-01-25 (×2): 1.2 ug/kg/h via INTRAVENOUS
  Administered 2016-01-25: 1 ug/kg/h via INTRAVENOUS
  Filled 2016-01-22 (×2): qty 100
  Filled 2016-01-22: qty 50
  Filled 2016-01-22 (×7): qty 100

## 2016-01-22 MED ORDER — VECURONIUM BROMIDE 10 MG IV SOLR
INTRAVENOUS | Status: AC
  Filled 2016-01-22: qty 10

## 2016-01-22 MED ORDER — SODIUM CHLORIDE 0.9 % IV SOLN
30.0000 meq | Freq: Once | INTRAVENOUS | Status: AC
  Administered 2016-01-22: 30 meq via INTRAVENOUS
  Filled 2016-01-22: qty 15

## 2016-01-22 MED ORDER — STERILE WATER FOR INJECTION IJ SOLN
INTRAMUSCULAR | Status: AC
  Filled 2016-01-22: qty 10

## 2016-01-22 NOTE — Progress Notes (Signed)
Pt has a bite block in place. Bite block was moved from left side of mouth to the right. Pt was gagging some but tolerated replacement fairly well. Small area of redness noted on left/mid center of mouth. Alyse Low, RN notified.

## 2016-01-22 NOTE — Progress Notes (Signed)
Nutrition Follow-up  DOCUMENTATION CODES:   Not applicable  INTERVENTION:  -With current diprivan, recommend continuing Vital High Protein at rate of 60 ml/hr; d/c Prostat at this time. Continue to asses  NUTRITION DIAGNOSIS:   Inadequate oral intake related to acute illness as evidenced by NPO status. Being addressed via TF  GOAL:   Provide needs based on ASPEN/SCCM guidelines  MONITOR:   Vent status, Labs, Weight trends, TF tolerance  REASON FOR ASSESSMENT:   Ventilator    ASSESSMENT:   66 yo female admitted with acute respiratory failure, shock s/p cardiac arrest, ARF. Pt with hx of CAD, COPD, CVA, CHF, HTN, PVD, hiatal hernia  Pt remains on vent support  Labs: reviewed Meds: reviewed Diet Order:   NPO  Skin:  Reviewed, no issues  Last BM:  no documented BM  Height:   Ht Readings from Last 1 Encounters:  01/12/2016 5\' 6"  (1.676 m)    Weight:   Wt Readings from Last 1 Encounters:  01/22/16 186 lb 11.7 oz (84.7 kg)   BMI:  Body mass index is 30.14 kg/m.  Estimated Nutritional Needs:   Kcal:  [id number] kcals   Protein:  125-166 g  Fluid:  >/= 1.8 L  EDUCATION NEEDS:   No education needs identified at this time  [place], [place], [place] [phone] Pager  [phone] Weekend/On-Call Pager

## 2016-01-22 NOTE — Progress Notes (Signed)
Pharmacy  Note  Dawn Donaldson is a 66 y.o. female with a h/o COPD and CHF admitted on 01/16/2016 after cardiac arrest with pneumonia and sepsis.  Pharmacy has been consulted for Zosyn dosing and electrolyte monitoring.   Plan: 1. Antibiotics: Continue Zosyn 3.375g IV q8h (4 hour infusion).   2. Electrolytes: KCl 30 meq iv x 1. Will follow-up with labs in the AM.    Height: 5\' 6"  (167.6 cm) Weight: 186 lb 11.7 oz (84.7 kg) IBW/kg (Calculated) : 59.3  Temp (24hrs), Avg:98.9 F (37.2 C), Min:97.9 F (36.6 C), Max:100.2 F (37.9 C)   Recent Labs Lab 01/31/2016 1537 02/04/2016 1858 01/29/2016 2344 01/20/16 0539 01/21/16 0533 01/22/16 0445  WBC 16.2*  --  26.3* 25.8* 22.8* 18.4*  CREATININE 0.87  --  1.12* 1.16* 0.92 0.67  LATICACIDVEN 8.8* 2.6*  --   --   --   --     Estimated Creatinine Clearance: 79 mL/min (by C-G formula based on SCr of 0.67 mg/dL).    Allergies  Allergen Reactions  . Dexlansoprazole Nausea And Vomiting  . Ultram [Tramadol] Itching    Antimicrobials this admission: Vancomycin 1/15 >> 1/16 Zosyn 1/15 >>   Dose adjustments this admission: N/A  Microbiology results: 1/15 BCx: NGTD 1/15 UCx: NG 1/15 Culture, Respiratory (tracheal aspirate): pending  1/15 MRSA PCR: negative   Thank you for allowing pharmacy to be a part of this patient's care.  Napoleon Form, PharmD 01/22/2016 1:23 PM

## 2016-01-22 NOTE — Progress Notes (Signed)
Spoke with patient's family regarding Dr Bernita Buffy calling staff to request updates on patient's condition.  Family had no issues with updates that have already been given, but wishes for staff to refer him back to the family from now on.  They stated they have actually been in contact with Dr Carlean Purl nursing staff since patient's collapse

## 2016-01-22 NOTE — Progress Notes (Signed)
Bite block removed. Bite block not needed at this time.

## 2016-01-22 NOTE — Progress Notes (Signed)
[facility] Consultation     ASSESSMENT/PLAN   Acute cardiac arrest of uncertain etiology, suspect respiratory infection.   PULMONARY A: Acute hypoxic and hypercapnic resp failure secondary to Viral Bronchitis-- AECOPD.  Small Left pneumothorax secondary to displaced fractures of the left lateral fourth through eighth ribs CXR images reviewed; near resolution of ptx, left chest tube in place.  Vent Settings: PRVC/20/450/5/30% ABG 7.41/50/6984/31.7; c/w compensated hypercapnic respiratory failure.  Hx: Obesity Subcutaneous Emphysema P:   Full vent support wean as tolerated, not yet ready for extubation due to agitation on wean of sedation.-- Check CT chest  --Start Precedex, vent wean down propofol. Continue bronchodilator therapy Left chest tube in place on suction, no air leak noted today.  Continue Solu-Medrol.   CARDIOVASCULAR A:  Shock with PEA cardiac arrest likely secondary to respiratory arrest  Hx: HTN, Hyperlipidemia, Hypercholesteremia, Diastolic CHF, Anginal Pain, Peripheral Vascular Disease, and CAD  P:  Continuous telemetry monitoring Trend troponin's  Echo pending  Epi gtt to Maintain goal>65, patient is currently not any pressors.  RENAL A:   Metabolic and respiratory acidosis-resolved  Acute Kidney Injury P:   Trend BMP Replace electrolytes as indicated Monitor UOP  GASTROINTESTINAL A:   Hx: GERD  P:   Continue Pepcid for GERD Continue tube feeding  HEMATOLOGIC A:   Leukocytosis-trending down Mild Anemia  P:  Trend CBC Lovenox for VTE prophylaxis Monitor for s/sx of bleeding Transfuse for hgb <7   INFECTIOUS A:   Suspect viral bronchitis with AECOPD.  P:   Continue empiric tamiflu.  Continue abx as listed below  Follow cultures   Micro/culture results: BCx2 01/15>>NTD UC 01/15>>negative Sputum>>-- MRSA screen negative.  Flu 1/15 negative.   Antibiotics: 1/15 Zosyn >> 1/15 Vancomycin >> 1/16 1/15  Tamiflu >>  ENDOCRINE A: Hyperglycemia  P: Continue SSI   NEUROLOGIC A:   Metabolic and anoxic encephalopathy Hx: Chronic lower back pain  P: Maintain RASS 0 to -1 Change propofol to precedex for weaning this am.     MAJOR EVENTS/TEST RESULTS: CT Chest 01/15>>Left-sided pneumothorax, moderate in size, most prominent at the left lung base. Associated subcutaneous emphysema within the chest walls bilaterally. Displaced fractures of the left lateral fourth through eighth Ribs. Extensive free intraperitoneal air within the upper abdomen. CT abdomen and pelvis is recommended to exclude bowel perforation. This free intraperitoneal air could conceivably be due to a focal diaphragmatic disruption/laceration, not discretely seen on this chest CT and less likely than intra-abdominal viscus perforation. Aortic atherosclerosis. Coronary artery calcifications, particularly dense within the left anterior descending and circumflex coronary arteries. CT Abd Pelvis 01/15>>Large amount of free intraperitoneal air within the abdomen and pelvis. This is of uncertain etiology. The bowel is unremarkable and there is no definite bowel perforation identified. I favor that this free intraperitoneal air has dissected down from the intrathoracic cavity with accentuation by recent CPR. Small residual pneumothorax at the left lung base, decreased after chest tube placement. Large amount of associated subcutaneous emphysema within the superficial soft tissues of the chest and abdominal walls. Aortic atherosclerosis.   ---------------------------------------  ---------------------------------------   Name: Hiral Der MRN: [id number] DOB: 10-Aug-1950    ADMISSION DATE:  01/10/2016  Subjective:  Patient does not follow any commands.  Remains on vent.  No acute events overnight.  REVIEW OF SYSTEMS:   Unable to assess pt intubated  VITAL SIGNS: Temp:  [97.9 F (36.6 C)-100.2 F (37.9 C)] 99 F (37.2 C)  (01/18 1000) Pulse Rate:  [  81-102] 93 (01/18 0900) Resp:  [18-40] 18 (01/18 1000) BP: (94-163)/(52-105) 102/52 (01/18 1000) SpO2:  [88 %-100 %] 95 % (01/18 0900) FiO2 (%):  [30 %] 30 % (01/18 0806) Weight:  [186 lb 11.7 oz (84.7 kg)] 186 lb 11.7 oz (84.7 kg) (01/18 0500) HEMODYNAMICS:   VENTILATOR SETTINGS: Vent Mode: PRVC FiO2 (%):  [30 %] 30 % Set Rate:  [20 bmp] 20 bmp Vt Set:  [450 mL] 450 mL PEEP:  [5 cmH20] 5 cmH20 INTAKE / OUTPUT:  Intake/Output Summary (Last 24 hours) at 01/22/16 1058 Last data filed at 01/22/16 1000  Gross per 24 hour  Intake          4557.63 ml  Output             2288 ml  Net          2269.63 ml    Physical Examination:   VS: BP (!) 102/52   Pulse 93   Temp 99 F (37.2 C)   Resp 18   Ht 5\' 6"  (1.676 m)   Wt 186 lb 11.7 oz (84.7 kg)   SpO2 95%   BMI 30.14 kg/m   General Appearance: acutely ill appearing Caucasian female  Neuro: sedated withdraws from painful stimulation, PERRL , non purposeful, increased agigation when decreasing sedation.  HEENT: supple, no JVD Pulmonary: diminished throughout, even, non labored, left chest tube in place, mechanically intubated;  Cardiovascular: NSR, s1s2, no M/R/G  Abdomen: +BS x4, benign, soft, non-tender, non-distended. Skin:   warm, no rashes or lesions  Extremities: normal bulk and tone    LABS: Reviewed   LABORATORY PANEL:   CBC  Recent Labs Lab 01/22/16 0445  WBC 18.4*  HGB 9.6*  HCT 28.7*  PLT 108*    Chemistries   Recent Labs Lab 01/22/16 0445  NA 143  K 3.4*  CL 109  CO2 31  GLUCOSE 173*  BUN 24*  CREATININE 0.67  CALCIUM 7.8*  MG 2.1  PHOS 3.2     Recent Labs Lab 01/21/16 1650 01/21/16 2134 01/22/16 0005 01/22/16 0212 01/22/16 0359 01/22/16 0733  GLUCAP 100* 161* 142* 156* 179* 133*    Recent Labs Lab 01/20/16 1700 01/21/16 0508 01/22/16 0427  PHART 7.42 7.43 7.41  PCO2ART 52* 52* 50*  PO2ART 88 69* 84    Recent Labs Lab 01/21/16 0730    ALBUMIN 2.6*    Cardiac Enzymes  Recent Labs Lab 01/20/16 1848  TROPONINI 0.16*    RADIOLOGY:  Dg Chest 1 View  Result Date: 01/22/2016 CLINICAL DATA:  Shortness of breath. EXAM: CHEST 1 VIEW COMPARISON:  01/21/2016. FINDINGS: Endotracheal tube, NG tube, right PICC line, left chest tube in stable position. Tiny left pneumothorax cannot be excluded. Heart size stable. Left base subsegmental atelectasis. Diffuse bilateral chest wall subcutaneous emphysema again noted. Heart size stable. IMPRESSION: 1. Lines and tubes including left chest tube in stable position. Tiny pneumothorax cannot be excluded. Diffuse bilateral chest wall subcutaneous emphysema is again noted. No interim change. 2.  Mild left base subsegmental atelectasis. Critical Value/emergent results were called by telephone at the time of interpretation on 01/22/2016 at 6:59 am to nurse Hiral, who verbally acknowledged these results. Electronically Signed   By: Marcello Moores  Register   On: 01/22/2016 07:01   Dg Chest Port 1 View  Result Date: 01/21/2016 CLINICAL DATA:  Hypoxia EXAM: PORTABLE CHEST 1 VIEW COMPARISON:  January 20, 2016 chest radiograph and chest CT January 19, 2016 FINDINGS: Endotracheal tube tip is 5.5  cm above the carina. Nasogastric tube tip and side port are not in the stomach. Central catheter tip is at the cavoatrial junction. There is a pigtail catheter at the left base region. No pneumothorax is demonstrable by radiography. Extensive subcutaneous air remains. There is new focal consolidation in the left base. Lungs elsewhere clear. Heart size and pulmonary vascularity are normal. No adenopathy. There is atherosclerotic calcification in the aorta. No evident bone lesions. IMPRESSION: Tube and catheter positions as described. No pneumothorax evident by radiography. Note that there is a new pigtail catheter at the left base. There is new consolidation in the left base. Question focal pneumonia versus possible contusion.  Lungs elsewhere clear. Stable cardiac silhouette. Extensive subcutaneous air remains. Aortic atherosclerosis. Electronically Signed   By: Lowella Grip III M.D.   On: 01/21/2016 07:04   Update: No family present at bedside.  Marda Stalker, M.D. 01/22/2016   Critical Care Attestation.  I have personally obtained a history, examined the patient, evaluated laboratory and imaging results, formulated the assessment and plan and placed orders. The Patient requires high complexity decision making for assessment and support, frequent evaluation and titration of therapies, application of advanced monitoring technologies and extensive interpretation of multiple databases. The patient has critical illness that could lead imminently to failure of 1 or more organ systems and requires the highest level of physician preparedness to intervene.  Critical Care Time devoted to patient care services described in this note is 45 minutes and is exclusive of time spent in procedures.

## 2016-01-22 NOTE — Progress Notes (Signed)
At approx 2300 pt had 20 beat run of SVT.  [place], NP informed. Orders received to replace phosphorus (last result 2.1).  Will continue to monitor.

## 2016-01-23 ENCOUNTER — Inpatient Hospital Stay: Payer: Medicare Other

## 2016-01-23 DIAGNOSIS — J9601 Acute respiratory failure with hypoxia: Secondary | ICD-10-CM

## 2016-01-23 DIAGNOSIS — J9602 Acute respiratory failure with hypercapnia: Secondary | ICD-10-CM

## 2016-01-23 LAB — BASIC METABOLIC PANEL
ANION GAP: 6 (ref 5–15)
BUN: 31 mg/dL — AB (ref 6–20)
CALCIUM: 8 mg/dL — AB (ref 8.9–10.3)
CO2: 28 mmol/L (ref 22–32)
Chloride: 111 mmol/L (ref 101–111)
Creatinine, Ser: 0.57 mg/dL (ref 0.44–1.00)
GFR calc Af Amer: >60 mL/min (ref >60)
GLUCOSE: 129 mg/dL — AB (ref 65–99)
Potassium: 3.7 mmol/L (ref 3.5–5.1)
SODIUM: 145 mmol/L (ref 135–145)

## 2016-01-23 LAB — CBC
HCT: 28.7 % — ABNORMAL LOW (ref 35.0–47.0)
Hemoglobin: 9.6 g/dL — ABNORMAL LOW (ref 12.0–16.0)
MCH: 32.9 pg (ref 26.0–34.0)
MCHC: 33.5 g/dL (ref 32.0–36.0)
MCV: 98.1 fL (ref 80.0–100.0)
PLATELETS: 118 10*3/uL — AB (ref 150–440)
RBC: 2.92 MIL/uL — ABNORMAL LOW (ref 3.80–5.20)
RDW: 13.5 % (ref 11.5–14.5)
WBC: 17.1 10*3/uL — AB (ref 3.6–11.0)

## 2016-01-23 LAB — GLUCOSE, CAPILLARY
GLUCOSE-CAPILLARY: 142 mg/dL — AB (ref 65–99)
GLUCOSE-CAPILLARY: 144 mg/dL — AB (ref 65–99)
Glucose-Capillary: 135 mg/dL — ABNORMAL HIGH (ref 65–99)
Glucose-Capillary: 120 mg/dL — ABNORMAL HIGH (ref 65–99)
Glucose-Capillary: 116 mg/dL — ABNORMAL HIGH (ref 65–99)
Glucose-Capillary: 134 mg/dL — ABNORMAL HIGH (ref 65–99)

## 2016-01-23 LAB — TRIGLYCERIDES: TRIGLYCERIDES: 76 mg/dL (ref <150)

## 2016-01-23 LAB — CKMB (ARMC ONLY): CK, MB: 1.4 ng/mL (ref 0.5–5.0)

## 2016-01-23 LAB — HEPATITIS C ANTIBODY

## 2016-01-23 LAB — TROPONIN I: Troponin I: <0.03 ng/mL (ref <0.03)

## 2016-01-23 MED ORDER — FENTANYL CITRATE (PF) 100 MCG/2ML IJ SOLN
50.0000 ug | INTRAMUSCULAR | Status: DC | PRN
  Administered 2016-01-24 – 2016-01-26 (×11): 50 ug via INTRAVENOUS
  Filled 2016-01-23 (×4): qty 2

## 2016-01-23 MED ORDER — MIDAZOLAM HCL 2 MG/2ML IJ SOLN
1.0000 mg | INTRAMUSCULAR | Status: DC | PRN
  Administered 2016-01-25 – 2016-01-26 (×6): 2 mg via INTRAVENOUS
  Filled 2016-01-23 (×6): qty 2

## 2016-01-23 MED ORDER — FREE WATER
200.0000 mL | Freq: Three times a day (TID) | Status: DC
  Administered 2016-01-23 – 2016-01-26 (×9): 200 mL

## 2016-01-23 MED ORDER — HYDRALAZINE HCL 20 MG/ML IJ SOLN
5.0000 mg | INTRAMUSCULAR | Status: DC | PRN
  Administered 2016-01-23: 5 mg via INTRAVENOUS

## 2016-01-23 MED ORDER — HYDRALAZINE HCL 20 MG/ML IJ SOLN
10.0000 mg | Freq: Once | INTRAMUSCULAR | Status: AC
  Administered 2016-01-23: 10 mg via INTRAVENOUS
  Filled 2016-01-23: qty 1

## 2016-01-23 MED ORDER — LABETALOL HCL 5 MG/ML IV SOLN
10.0000 mg | Freq: Once | INTRAVENOUS | Status: AC
  Administered 2016-01-23: 10 mg via INTRAVENOUS
  Filled 2016-01-23: qty 4

## 2016-01-23 MED ORDER — HYDRALAZINE HCL 20 MG/ML IJ SOLN
INTRAMUSCULAR | Status: AC
  Administered 2016-01-23: 5 mg via INTRAVENOUS
  Filled 2016-01-23: qty 1

## 2016-01-23 MED ORDER — HYDRALAZINE HCL 20 MG/ML IJ SOLN
10.0000 mg | INTRAMUSCULAR | Status: DC | PRN
  Administered 2016-01-24 – 2016-01-25 (×4): 10 mg via INTRAVENOUS
  Filled 2016-01-23 (×3): qty 1

## 2016-01-23 NOTE — Progress Notes (Signed)
Patient with sudden increase in B/P (Sytolic 123456), diaphoretic, HR 120s, and RR 35-40, with abdominal breathing. Appears right pupil is now 3 and reactive, previously each pupil 2, and brisk.  NP Hinton Dyer at bedside, advised of changes, order to restart Precedex (started at 1 mcg), and 10 mg Hydrazaline x1, and STAT head CT.

## 2016-01-23 NOTE — Progress Notes (Addendum)
[facility] Progess Note    ASSESSMENT/PLAN   Acute hypoxemic and hypercapnic respiratory failure secondary to viral bronchitis. Resultant cardiac arrest, with respiratory most likely underlying etiology. Presently on mechanical ventilation. Mental status is poor at this time we'll hold on weaning until improved. Patient is presently on PRVC, 30% FiO2, with a target title volume 450 mL, pending a.m. blood gas. Chest x-ray reveals chest tube in place with inflated left lung, small rim of subcutaneous emphysema, endotracheal tube is in place, right sided PICC line noted. Presently on Zosyn, Tamiflu, Solu-Medrol. CT head consistent with hypoxemic lung injury. Will obtain EEG and consult Neurology to assist with prognosis.   Status post PA arrest. Underlying history to include hypertension, hyperlipidemia, diastolic congestive heart failure, coronary artery disease. No arrhythmias noted, peak troponin is 0.16. pending echocardiography  Prerenal azotemia. BUN is 31 over creatinine 0.57, will follow  Hypoglycemia. Blood sugar is 128 within acceptable range  Leukocytosis. Presently 17.1, thus far cultures are negative. Will complete 7 day course of Zosyn, and 5 days of Tamiflu    Name: Dawn Donaldson MRN: [id number] DOB: 1950/01/27    ADMISSION DATE:  01/11/2016   SUBJECTIVE:   Pt currently on the ventilator, can not provide history or review of systems. No significant change last 24 hours. No significant improvement in mental status  Review of Systems:  Constitutional: Feels well. Cardiovascular: No chest pain.  Pulmonary: Denies dyspnea.   The remainder of systems were reviewed and were found to be negative other than what is documented in the HPI.    VITAL SIGNS: Temp:  [98.8 F (37.1 C)-100 F (37.8 C)] 100 F (37.8 C) (01/19 0200) Pulse Rate:  [76-104] 87 (01/19 0200) Resp:  [16-31] 20 (01/19 0200) BP: (102-150)/(52-85) 130/68 (01/19 0200) SpO2:  [90  %-100 %] 95 % (01/19 0322) FiO2 (%):  [30 %] 30 % (01/19 0751) Weight:  [86.7 kg (191 lb 2.2 oz)] 86.7 kg (191 lb 2.2 oz) (01/19 0454)   VENTILATOR SETTINGS: Vent Mode: PRVC FiO2 (%):  [30 %] 30 % Set Rate:  [20 bmp] 20 bmp Vt Set:  [450 mL] 450 mL PEEP:  [5 cmH20] 5 cmH20   INTAKE / OUTPUT:  Intake/Output Summary (Last 24 hours) at 01/23/16 0838 Last data filed at 01/23/16 0508  Gross per 24 hour  Intake           2178.5 ml  Output             1160 ml  Net           1018.5 ml    PHYSICAL EXAMINATION: Physical Examination:   VS: BP 130/68   Pulse 87   Temp 100 F (37.8 C)   Resp 20   Ht 5\' 6"  (1.676 m)   Wt 86.7 kg (191 lb 2.2 oz)   SpO2 95%   BMI 30.85 kg/m   General Appearance: Patient presently unresponsive on mechanical ventilation  HEENT: PERRLA, EOM intact. Pulmonary: Bilateral basilar crackles appreciated. Subcutaneous air is appreciated on the left on exam, chest tube is in place  CardiovascularNormal S1,S2.  No m/r/g.   Abdomen: Benign, Soft, non-tender. Renal:  No costovertebral tenderness  GU:  Not performed at this time. Endocrine: No evident thyromegaly. Skin:   warm, no rashes Extremities: Edema appreciated   LABS:   LABORATORY PANEL:   CBC  Recent Labs Lab 01/23/16 0542  WBC 17.1*  HGB 9.6*  HCT 28.7*  PLT 118*  Chemistries   Recent Labs Lab 01/22/16 0445 01/23/16 0542  NA 143 145  K 3.4* 3.7  CL 109 111  CO2 31 28  GLUCOSE 173* 129*  BUN 24* 31*  CREATININE 0.67 0.57  CALCIUM 7.8* 8.0*  MG 2.1  --   PHOS 3.2  --      Recent Labs Lab 01/22/16 1209 01/22/16 1628 01/22/16 1946 01/22/16 2356 01/23/16 0448 01/23/16 0701  GLUCAP 127* 147* 125* 117* 135* 142*    Recent Labs Lab 01/20/16 1700 01/21/16 0508 01/22/16 0427  PHART 7.42 7.43 7.41  PCO2ART 52* 52* 50*  PO2ART 88 69* 84    Recent Labs Lab 01/21/16 0730  ALBUMIN 2.6*    Cardiac Enzymes  Recent Labs Lab 01/20/16 1848  TROPONINI 0.16*      RADIOLOGY:  Dg Chest 1 View  Result Date: 01/23/2016 CLINICAL DATA:  Hypoxia EXAM: CHEST 1 VIEW COMPARISON:  January 22, 2016 FINDINGS: Endotracheal tube tip is 6.9 cm above the carina. Central catheter tip is in the superior vena cava. Nasogastric tube tip and side port below the diaphragm. Chest tube is present on the left inferiorly. There appears to be a skin fold on the left laterally. There may be equivocal residual pneumothorax in the left apex. No tension component. There is extensive soft tissue air bilaterally. There is atelectatic change in the left base. Lungs elsewhere clear. Heart size and pulmonary vascular normal. No adenopathy. No bone lesions. IMPRESSION: Tube and catheter positions as described. Suspect skin fold on the left laterally. A small amount of pneumothorax remaining on the left cannot be excluded. No tension component. Note that chest tube remains in the left base region. There is left base atelectasis. Lungs elsewhere clear. Stable cardiac silhouette. There is extensive subcutaneous emphysema bilaterally. Electronically Signed   By: Lowella Grip III M.D.   On: 01/23/2016 06:43   Dg Chest 1 View  Result Date: 01/22/2016 CLINICAL DATA:  Shortness of breath. EXAM: CHEST 1 VIEW COMPARISON:  01/21/2016. FINDINGS: Endotracheal tube, NG tube, right PICC line, left chest tube in stable position. Tiny left pneumothorax cannot be excluded. Heart size stable. Left base subsegmental atelectasis. Diffuse bilateral chest wall subcutaneous emphysema again noted. Heart size stable. IMPRESSION: 1. Lines and tubes including left chest tube in stable position. Tiny pneumothorax cannot be excluded. Diffuse bilateral chest wall subcutaneous emphysema is again noted. No interim change. 2.  Mild left base subsegmental atelectasis. Critical Value/emergent results were called by telephone at the time of interpretation on 01/22/2016 at 6:59 am to nurse Hiral, who verbally acknowledged these  results. Electronically Signed   By: Marcello Moores  Register   On: 01/22/2016 07:01   Ct Head Wo Contrast  Result Date: 01/22/2016 CLINICAL DATA:  Acute encephalopathy.  Cardiac arrest. EXAM: CT HEAD WITHOUT CONTRAST TECHNIQUE: Contiguous axial images were obtained from the base of the skull through the vertex without intravenous contrast. COMPARISON:  01/15/2008 FINDINGS: Brain: There is abnormal hypoattenuation involving the basal ganglia bilaterally, most conspicuous in the caudate nuclei though with involvement of the lentiform nuclei as well. There is also subtle loss of gray-white differentiation in the posterior cerebral hemispheres bilaterally with possible slight sulcal effacement in these regions when comparing with the prior CT. There is no evidence of acute intracranial hemorrhage, mass, midline shift, or extra-axial fluid collection. The basilar cisterns are patent. Vascular: Mild calcified atherosclerosis at the skullbase. No hyperdense vessel. Skull: No fracture or focal osseous lesion. Sinuses/Orbits: Visualized paranasal sinuses and mastoid air cells  are clear. Visualized orbits are unremarkable. Other: None. IMPRESSION: Findings consistent with hypoxic ischemic injury.  No herniation. Electronically Signed   By: Logan Bores M.D.   On: 01/22/2016 11:58       Hermelinda Dellen, DO [facility] Office Number: [phone]  Patricia Pesa, M.D.  Vilinda Boehringer, M.D.  Merton Border, M.D  01/23/2016

## 2016-01-23 NOTE — Progress Notes (Signed)
Pharmacy  Note  Dawn Donaldson is a 66 y.o. female with a h/o COPD and CHF admitted on 01/17/2016 after cardiac arrest with pneumonia and sepsis.  Pharmacy has been consulted for Zosyn dosing and electrolyte monitoring.   Plan: 1. Antibiotics: Continue Zosyn 3.375g IV q8h (4 hour infusion) for a total of 7 days per discussion with Dr. Jefferson Fuel.   2. Electrolytes: WNL. Will f/u labs in 48 hours.     Height: 5\' 6"  (167.6 cm) Weight: 191 lb 2.2 oz (86.7 kg) IBW/kg (Calculated) : 59.3  Temp (24hrs), Avg:99.6 F (37.6 C), Min:98.8 F (37.1 C), Max:100 F (37.8 C)   Recent Labs Lab 01/31/2016 1537 01/16/2016 1858 01/18/2016 2344 01/20/16 0539 01/21/16 0533 01/22/16 0445 01/23/16 0542  WBC 16.2*  --  26.3* 25.8* 22.8* 18.4* 17.1*  CREATININE 0.87  --  1.12* 1.16* 0.92 0.67 0.57  LATICACIDVEN 8.8* 2.6*  --   --   --   --   --     Estimated Creatinine Clearance: 79.9 mL/min (by C-G formula based on SCr of 0.57 mg/dL).    Allergies  Allergen Reactions  . Dexlansoprazole Nausea And Vomiting  . Ultram [Tramadol] Itching    Antimicrobials this admission: Vancomycin 1/15 >> 1/16 Zosyn 1/15 >>   Dose adjustments this admission: N/A  Microbiology results: 1/15 BCx: NGTD 1/15 UCx: NG 1/15 MRSA PCR: negative   Thank you for allowing pharmacy to be a part of this patient's care.  Napoleon Form, PharmD 01/23/2016 1:43 PM

## 2016-01-23 NOTE — Progress Notes (Signed)
eLink Physician-Brief Progress Note Patient Name: Dawn Donaldson DOB: [birth year] MRN: [id number]   Date of Service  01/23/2016  HPI/Events of Note  BP 178/80  eICU Interventions  Ordered hydralazine PRN        Trachelle Low 01/23/2016, 6:24 PM

## 2016-01-23 NOTE — Progress Notes (Signed)
Patient has been off of Precedex this shift, and no PRN sedatives given today. Patient occasionally coughs, but does not respond to verbal commands, other to open her eyes, but not consistently. Tmax 100.4. SR on cardiac monitor, PRVC 30%, maintaining B/P without problem. Left chest tube in place with minimal drainage, secondary to pneumo possibly from CPR. Has become more hypotensive through shift, discussed with [facility] MD who stated he will add Hydrazaline PRN. Tube feeds infusing through OG. Adequate UOP through foley, and still diarrhea with flexiseal in place. EEG perfomed this shift, family awaiting results and discussion with neurologist.

## 2016-01-23 NOTE — Progress Notes (Signed)
1935: Leaving for STAT CT w/ Kim, RT & Jessica, NT.  VSS, precedex gtt running.  2000: Back from CT, VSS, precedex gtt running. Will continue to monitor pt. Closely.

## 2016-01-24 LAB — CULTURE, BLOOD (ROUTINE X 2)
Culture: NO GROWTH
Culture: NO GROWTH

## 2016-01-24 LAB — GLUCOSE, CAPILLARY
GLUCOSE-CAPILLARY: 131 mg/dL — AB (ref 65–99)
GLUCOSE-CAPILLARY: 148 mg/dL — AB (ref 65–99)
GLUCOSE-CAPILLARY: 163 mg/dL — AB (ref 65–99)
Glucose-Capillary: 171 mg/dL — ABNORMAL HIGH (ref 65–99)
Glucose-Capillary: 153 mg/dL — ABNORMAL HIGH (ref 65–99)

## 2016-01-24 LAB — CBC
HEMATOCRIT: 31.3 % — AB (ref 35.0–47.0)
HEMOGLOBIN: 10.5 g/dL — AB (ref 12.0–16.0)
MCH: 33.1 pg (ref 26.0–34.0)
MCHC: 33.6 g/dL (ref 32.0–36.0)
MCV: 98.5 fL (ref 80.0–100.0)
Platelets: 126 10*3/uL — ABNORMAL LOW (ref 150–440)
RBC: 3.18 MIL/uL — ABNORMAL LOW (ref 3.80–5.20)
RDW: 14.1 % (ref 11.5–14.5)
WBC: 13 10*3/uL — AB (ref 3.6–11.0)

## 2016-01-24 LAB — PHOSPHORUS: Phosphorus: 3.2 mg/dL (ref 2.5–4.6)

## 2016-01-24 NOTE — Progress Notes (Signed)
[facility] Progess Note    ASSESSMENT/PLAN   Acute hypoxemic and hypercapnic respiratory failure secondary to viral bronchitis. Resultant cardiac arrest, with respiratory most likely underlying etiology. Presently on mechanical ventilation. Mental status is poor at this time we'll hold on weaning until improved.Appreciate neurology input. Patient had labile vital signs yesterday and unequal pupil, stat CT scan of the head noted. Slight progression of anoxic brain injury. Pending results of the EEG .Some improved physical exam findings today little bit more responsive. Discussed with family. We'll continue care. Presently on Zosyn, completing course of Tamiflu, Solu-Medrol and Precedex infusion. We'll obtain morning. Chest x-ray  Status post PA arrest. Underlying history to include hypertension, hyperlipidemia, diastolic congestive heart failure, coronary artery disease. No arrhythmias noted, peak troponin is 0.16. pending echocardiography  Prerenal azotemia. BUN is 31 over creatinine 0.57, will follow  Leukocytosis. Presently 13, thus far cultures are negative. Will complete 7 day course of Zosyn, and 5 days of Tamiflu  On Lovenox and Pepcid for prophylaxis.  Name: Dawn Donaldson MRN: [id number] DOB: [birth year]    ADMISSION DATE:  01/11/2016   SUBJECTIVE:   Pt currently on the ventilator, can not provide history or review of systems. No significant change last 24 hours. No significant improvement in mental status  Review of Systems:  Constitutional: Feels well. Cardiovascular: No chest pain.  Pulmonary: Denies dyspnea.   The remainder of systems were reviewed and were found to be negative other than what is documented in the HPI.    VITAL SIGNS: Temp:  [97.9 F (36.6 C)-100.2 F (37.9 C)] 97.9 F (36.6 C) (01/20 0800) Pulse Rate:  [62-112] 73 (01/20 0800) Resp:  [15-25] 15 (01/20 0800) BP: (110-212)/(63-119) 146/81 (01/20 0800) SpO2:  [93 %-100 %] 97  % (01/20 0817) FiO2 (%):  [30 %] 30 % (01/20 0817)   VENTILATOR SETTINGS: Vent Mode: PRVC FiO2 (%):  [30 %] 30 % Set Rate:  [20 bmp] 20 bmp Vt Set:  [450 mL] 450 mL PEEP:  [5 cmH20] 5 cmH20   INTAKE / OUTPUT:  Intake/Output Summary (Last 24 hours) at 01/24/16 0845 Last data filed at 01/24/16 0600  Gross per 24 hour  Intake             1904 ml  Output             1027 ml  Net              877 ml    PHYSICAL EXAMINATION: Physical Examination:   VS: BP (!) 146/81   Pulse 73   Temp 97.9 F (36.6 C)   Resp 15   Ht 5\' 6"  (1.676 m)   Wt 86.7 kg (191 lb 2.2 oz)   SpO2 97%   BMI 30.85 kg/m   General Appearance: Patient presently unresponsive on mechanical ventilation  HEENT: PERRLA, EOM intact. Pulmonary: Bilateral basilar crackles appreciated. Subcutaneous air is appreciated on the left on exam, chest tube is in place  CardiovascularNormal S1,S2.  No m/r/g.   Abdomen: Benign, Soft, non-tender. Renal:  No costovertebral tenderness  GU:  Not performed at this time. Endocrine: No evident thyromegaly. Skin:   warm, no rashes Extremities: Edema appreciated   LABS:   LABORATORY PANEL:   CBC  Recent Labs Lab 01/24/16 0430  WBC 13.0*  HGB 10.5*  HCT 31.3*  PLT 126*    Chemistries   Recent Labs Lab 01/22/16 0445 01/23/16 0542 01/24/16 0430  NA 143 145  --  K 3.4* 3.7  --   CL 109 111  --   CO2 31 28  --   GLUCOSE 173* 129*  --   BUN 24* 31*  --   CREATININE 0.67 0.57  --   CALCIUM 7.8* 8.0*  --   MG 2.1  --   --   PHOS 3.2  --  3.2     Recent Labs Lab 01/23/16 1119 01/23/16 1550 01/23/16 2016 01/23/16 2343 01/24/16 0314 01/24/16 0756  GLUCAP 120* 116* 134* 144* 171* 131*    Recent Labs Lab 01/20/16 1700 01/21/16 0508 01/22/16 0427  PHART 7.42 7.43 7.41  PCO2ART 52* 52* 50*  PO2ART 88 69* 84    Recent Labs Lab 01/21/16 0730  ALBUMIN 2.6*    Cardiac Enzymes  Recent Labs Lab 01/23/16 2027  TROPONINI <0.03    RADIOLOGY:   Dg Chest 1 View  Result Date: 01/23/2016 CLINICAL DATA:  Hypoxia EXAM: CHEST 1 VIEW COMPARISON:  January 22, 2016 FINDINGS: Endotracheal tube tip is 6.9 cm above the carina. Central catheter tip is in the superior vena cava. Nasogastric tube tip and side port below the diaphragm. Chest tube is present on the left inferiorly. There appears to be a skin fold on the left laterally. There may be equivocal residual pneumothorax in the left apex. No tension component. There is extensive soft tissue air bilaterally. There is atelectatic change in the left base. Lungs elsewhere clear. Heart size and pulmonary vascular normal. No adenopathy. No bone lesions. IMPRESSION: Tube and catheter positions as described. Suspect skin fold on the left laterally. A small amount of pneumothorax remaining on the left cannot be excluded. No tension component. Note that chest tube remains in the left base region. There is left base atelectasis. Lungs elsewhere clear. Stable cardiac silhouette. There is extensive subcutaneous emphysema bilaterally. Electronically Signed   By: Lowella Grip III M.D.   On: 01/23/2016 06:43   Ct Head Wo Contrast  Result Date: 01/23/2016 CLINICAL DATA:  Sudden increase in blood pressure, abnormal breathing, EXAM: CT HEAD WITHOUT CONTRAST TECHNIQUE: Contiguous axial images were obtained from the base of the skull through the vertex without intravenous contrast. COMPARISON:  01/22/2016 FINDINGS: Brain: Again visualized is subtle loss of the gray-white matter differentiation and sulcal effacement involving the posterior cerebral hemispheres. Abnormal low attenuation is present within the right greater than left basal ganglia slightly progressed in the interim. Suspect that there is edema in the right temporal lobe as well. No acute hemorrhage. No midline shift. Basilar cisterns appear patent. The ventricles are nonenlarged. Vascular: No hyperdense vessels. Scattered calcifications at the carotid  siphons. Skull: Mastoid air cells are clear.  There is no fracture Sinuses/Orbits: Mild mucosal thickening within the ethmoid and sphenoid sinuses. No acute orbital abnormality. Other: None IMPRESSION: 1. Again visualized is generalized sulcal effacement and loss of gray-white matter differentiation in the posterior cerebral hemispheres. There is abnormal hypoattenuation of the right greater than left basal ganglia, slightly progressed, findings consistent with hypoxic ischemic injury. There is no hemorrhage. The basilar cisterns remain patent. Electronically Signed   By: Donavan Foil M.D.   On: 01/23/2016 20:15   Ct Head Wo Contrast  Result Date: 01/22/2016 CLINICAL DATA:  Acute encephalopathy.  Cardiac arrest. EXAM: CT HEAD WITHOUT CONTRAST TECHNIQUE: Contiguous axial images were obtained from the base of the skull through the vertex without intravenous contrast. COMPARISON:  01/15/2008 FINDINGS: Brain: There is abnormal hypoattenuation involving the basal ganglia bilaterally, most conspicuous in the caudate  nuclei though with involvement of the lentiform nuclei as well. There is also subtle loss of gray-white differentiation in the posterior cerebral hemispheres bilaterally with possible slight sulcal effacement in these regions when comparing with the prior CT. There is no evidence of acute intracranial hemorrhage, mass, midline shift, or extra-axial fluid collection. The basilar cisterns are patent. Vascular: Mild calcified atherosclerosis at the skullbase. No hyperdense vessel. Skull: No fracture or focal osseous lesion. Sinuses/Orbits: Visualized paranasal sinuses and mastoid air cells are clear. Visualized orbits are unremarkable. Other: None. IMPRESSION: Findings consistent with hypoxic ischemic injury.  No herniation. Electronically Signed   By: Logan Bores M.D.   On: 01/22/2016 11:58       Hermelinda Dellen, DO  Pulmonary and Critical Care Office Number: [phone]  Patricia Pesa, M.D.    Vilinda Boehringer, M.D.  Merton Border, M.D  01/24/2016

## 2016-01-24 NOTE — Progress Notes (Signed)
Neurology:  S/p discussion with pt's significant other over the phone.  He is aware of anoxic brain injury and there is progression on CTH that was done yesterday.  There are signs of loss of grey/WM differentiation but but enough for me to say that pt will progress to brain death.    I have told him that she will not be herself and at best will need nursing home care.  EEG was done yesterday that shows slowing and no active seizures with occasional waves that look triphasic so maybe a metabolic component as well.  He did mention that pt would likely not want trach/peg if it progressed to it.  He is not primary decision maker because despite them being together for about 30 yrs they are not legally married.  There are daughters involved in case.    Plan is to continue same aggressive care. If patient does not improve next week I suspect they will proceed to withdrawal of care.  He wanted to continue full aggressive care and full code at this point  Leotis Pain

## 2016-01-24 NOTE — Progress Notes (Signed)
Pt. VSS, on precedex gtt. Unable to titrate down due to high peak pressures and biting ETT. UOP adequate at 30-50 mL/h.   No other complications at this time.

## 2016-01-24 NOTE — Progress Notes (Signed)
Pharmacy  Note  Dawn Donaldson is a 66 y.o. female with a h/o COPD and CHF admitted on 01/25/2016 after cardiac arrest with pneumonia and sepsis.  Pharmacy has been consulted for Zosyn dosing and electrolyte monitoring.   Plan: 1. Antibiotics: Continue Zosyn 3.375g IV q8h (4 hour infusion) for a total of 7 days per discussion with Dr. Jefferson Fuel. (started 01/29/2016)  2. Electrolytes: WNL. Will f/u labs in 48 hours on 01/25/16.     Height: 5\' 6"  (167.6 cm) Weight: 191 lb 2.2 oz (86.7 kg) IBW/kg (Calculated) : 59.3  Temp (24hrs), Avg:98.4 F (36.9 C), Min:97.7 F (36.5 C), Max:99.5 F (37.5 C)   Recent Labs Lab 01/20/2016 1537 01/07/2016 1858 02/04/2016 2344 01/20/16 0539 01/21/16 0533 01/22/16 0445 01/23/16 0542 01/24/16 0430  WBC 16.2*  --  26.3* 25.8* 22.8* 18.4* 17.1* 13.0*  CREATININE 0.87  --  1.12* 1.16* 0.92 0.67 0.57  --   LATICACIDVEN 8.8* 2.6*  --   --   --   --   --   --     Estimated Creatinine Clearance: 79.9 mL/min (by C-G formula based on SCr of 0.57 mg/dL).    Allergies  Allergen Reactions  . Dexlansoprazole Nausea And Vomiting  . Ultram [Tramadol] Itching    Antimicrobials this admission: Vancomycin 1/15 >> 1/16 Zosyn 1/15 >>   Dose adjustments this admission: N/A  Microbiology results: 1/15 BCx: NGTD 1/15 UCx: NG 1/15 MRSA PCR: negative   Thank you for allowing pharmacy to be a part of this patient's care.  Annmargaret Decaprio A, PharmD 01/24/2016 4:46 PM

## 2016-01-24 NOTE — Progress Notes (Signed)
Precedex gtt 0.9 most of day.  At lower dose of 0.7 she will open eyes, buck vent, bite ETT, and become very red faced. Moves LE's to brush on sole of foot.  Hands edematous and with slight response to pain. Slight spontaneous movements of all 4 extremities noted when agitated with suctioning or oral care.  PERL. Slight downward gaze right eye. When opens eyes she does not appear to focus or track objects. Her significant others voice seems to have a calming effect, She has had fentanyl 54mcg x3 today for possible pain exhibited in eyebrow and extremity tension when aroused.Lungs are diminished with fine scattered wheezes. Sats 97-100% on 30 % fio2.  Chest tube patent without  Air leak. Minimal SUBQ emphysema. Urine output adequate. Dr Jefferson Fuel and Dr Irish Elders have spoken at length with family members. They understand grave situation and likely poor prognosis but are wanting to continue current care and Full Code status. They do not want her to suffer but would like [place] to keep sedation as light as possible to see what function she has. We have been able to achieve this goal with precedex and prn fentanyl.

## 2016-01-25 ENCOUNTER — Inpatient Hospital Stay: Payer: Medicare Other

## 2016-01-25 LAB — GLUCOSE, CAPILLARY
GLUCOSE-CAPILLARY: 156 mg/dL — AB (ref 65–99)
Glucose-Capillary: 117 mg/dL — ABNORMAL HIGH (ref 65–99)
Glucose-Capillary: 130 mg/dL — ABNORMAL HIGH (ref 65–99)
Glucose-Capillary: 137 mg/dL — ABNORMAL HIGH (ref 65–99)
Glucose-Capillary: 148 mg/dL — ABNORMAL HIGH (ref 65–99)
Glucose-Capillary: 160 mg/dL — ABNORMAL HIGH (ref 65–99)
Glucose-Capillary: 172 mg/dL — ABNORMAL HIGH (ref 65–99)

## 2016-01-25 LAB — BASIC METABOLIC PANEL
Anion gap: 4 — ABNORMAL LOW (ref 5–15)
BUN: 41 mg/dL — AB (ref 6–20)
CO2: 29 mmol/L (ref 22–32)
Calcium: 8.3 mg/dL — ABNORMAL LOW (ref 8.9–10.3)
Chloride: 112 mmol/L — ABNORMAL HIGH (ref 101–111)
Creatinine, Ser: 0.6 mg/dL (ref 0.44–1.00)
GFR calc Af Amer: >60 mL/min (ref >60)
GLUCOSE: 137 mg/dL — AB (ref 65–99)
POTASSIUM: 3.9 mmol/L (ref 3.5–5.1)
Sodium: 145 mmol/L (ref 135–145)

## 2016-01-25 LAB — CBC
HEMATOCRIT: 32.7 % — AB (ref 35.0–47.0)
HEMOGLOBIN: 11 g/dL — AB (ref 12.0–16.0)
MCH: 32.9 pg (ref 26.0–34.0)
MCHC: 33.7 g/dL (ref 32.0–36.0)
MCV: 97.8 fL (ref 80.0–100.0)
Platelets: 137 10*3/uL — ABNORMAL LOW (ref 150–440)
RBC: 3.35 MIL/uL — ABNORMAL LOW (ref 3.80–5.20)
RDW: 13.8 % (ref 11.5–14.5)
WBC: 14.8 10*3/uL — AB (ref 3.6–11.0)

## 2016-01-25 LAB — MAGNESIUM: Magnesium: 2 mg/dL (ref 1.7–2.4)

## 2016-01-25 LAB — PHOSPHORUS: Phosphorus: 3.8 mg/dL (ref 2.5–4.6)

## 2016-01-25 MED ORDER — METOPROLOL TARTRATE 5 MG/5ML IV SOLN
INTRAVENOUS | Status: AC
  Administered 2016-01-25: 08:00:00
  Filled 2016-01-25: qty 5

## 2016-01-25 MED ORDER — METOPROLOL TARTRATE 5 MG/5ML IV SOLN: 5.0000 mg | Freq: Once | INTRAVENOUS | Status: AC

## 2016-01-25 MED ORDER — DEXMEDETOMIDINE HCL IN NACL 400 MCG/100ML IV SOLN
0.4000 ug/kg/h | INTRAVENOUS | Status: DC
  Administered 2016-01-25 – 2016-01-26 (×5): 1.2 ug/kg/h via INTRAVENOUS
  Filled 2016-01-25 (×3): qty 100
  Filled 2016-01-25: qty 50
  Filled 2016-01-25 (×2): qty 100

## 2016-01-25 MED ORDER — FENTANYL 2500MCG IN NS 250ML (10MCG/ML) PREMIX INFUSION
0.0000 ug/h | INTRAVENOUS | Status: DC
  Administered 2016-01-25: 25 ug/h via INTRAVENOUS
  Administered 2016-01-26: 100 ug/h via INTRAVENOUS
  Filled 2016-01-25 (×2): qty 250

## 2016-01-25 NOTE — Progress Notes (Signed)
Pt.'s family: daughter, significant other, granddaughter were bedside speaking with Dr. Irish Elders, neurology over the phone.  Pt.'s daughter made it clear that she and the significant other are in agreement that the pt. Would not want to have a Peg tube placed or a tracheostomy. Family stated the neurologist made it clear that the pt. Would require those interventions due to the severity of the injury to the brain. Pt.'s daughter and family were very tearful, chaplain was paged and provided comfort to the family.

## 2016-01-25 NOTE — Progress Notes (Signed)
Pharmacy  Note  Dawn Donaldson is a 66 y.o. female with a h/o COPD and CHF admitted on 01/07/2016 after cardiac arrest with pneumonia and sepsis.  Pharmacy has been consulted for Zosyn dosing and electrolyte monitoring.   Plan: 1. Antibiotics: Continue Zosyn 3.375g IV q8h (4 hour infusion) for a total of 7 days per discussion with Dr. Jefferson Fuel. (started 01/18/2016)  2. Electrolytes: WNL. Will f/u labs in 48 hours on 01/25/16.    1/21 0402 K 3.9, Mg 2, Phos 3.8, Ca 8.3, adjusted Ca 9.4. No further supplements warranted at this time. Will f/u labs in 2 days.    Height: 5\' 6"  (167.6 cm) Weight: 192 lb 0.3 oz (87.1 kg) IBW/kg (Calculated) : 59.3  Temp (24hrs), Avg:97.8 F (36.6 C), Min:97.7 F (36.5 C), Max:98.1 F (36.7 C)   Recent Labs Lab 01/07/2016 1537 01/18/2016 1858  01/20/16 0539 01/21/16 0533 01/22/16 0445 01/23/16 0542 01/24/16 0430 01/25/16 0402  WBC 16.2*  --   < > 25.8* 22.8* 18.4* 17.1* 13.0* 14.8*  CREATININE 0.87  --   < > 1.16* 0.92 0.67 0.57  --  0.60  LATICACIDVEN 8.8* 2.6*  --   --   --   --   --   --   --   < > = values in this interval not displayed.  Estimated Creatinine Clearance: 80 mL/min (by C-G formula based on SCr of 0.6 mg/dL).    Allergies  Allergen Reactions  . Dexlansoprazole Nausea And Vomiting  . Ultram [Tramadol] Itching    Antimicrobials this admission: Vancomycin 1/15 >> 1/16 Zosyn 1/15 >>   Dose adjustments this admission: N/A  Microbiology results: 1/15 BCx: NGTD 1/15 UCx: NG 1/15 MRSA PCR: negative   Thank you for allowing pharmacy to be a part of this patient's care.  Laural Benes, PharmD, BCPS Clinical Pharmacist 01/25/2016 5:02 AM

## 2016-01-25 NOTE — Progress Notes (Signed)
   01/24/16 2100  Clinical Encounter Type  Visited With Patient and family together  Visit Type Psychological support;Spiritual support;Social support  Referral From Patient  Consult/Referral To Nurse  Spiritual Encounters  Spiritual Needs Emotional  Stress Factors  Patient Stress Factors None identified  Family Stress Factors Loss of control;Major life changes  Advance Directives (For Healthcare)  Does Patient Have a Medical Advance Directive? No   Chaplain responded to a page @ 9:05pm  Chaplain provided emotional and spiritual care via prayer.

## 2016-01-25 NOTE — Progress Notes (Signed)
[facility] Progess Note    ASSESSMENT/PLAN   Acute hypoxemic and hypercapnic respiratory failure secondary to viral bronchitis. Resultant cardiac arrest, with respiratory most likely underlying etiology. Presently on mechanical ventilation.No significant improvement in mental status. Greatly appreciate neurology's assistance with management. Tried to stop sedation today resulting in severe agitation, hypertension but no appropriate response. Not much additional reversible disease at this point. Neurology is meeting with family at 70 and will discuss with them goals of care.  Pneumothorax. Chest x-ray shows some increasing pneumothorax. Chest tube evaluated does not reveal any air leak, there is tidalling noted in pleurovac. Will increase suction to Minus 40.   Status post PA arrest. Underlying history to include hypertension, hyperlipidemia, diastolic congestive heart failure, coronary artery disease. No arrhythmias noted, peak troponin is 0.16. pending echocardiography  Prerenal azotemia. BUN is 41 over creatinine 0.6, will follow  Leukocytosis. Presently 14.8, thus far cultures are negative. Will complete 7 day course of Zosyn, and 5 days of Tamiflu  On Lovenox and Pepcid for prophylaxis.  Name: Dawn Donaldson MRN: [id number] DOB: 30-Nov-1950    ADMISSION DATE:  01/06/2016   SUBJECTIVE:   Pt currently on the ventilator, can not provide history or review of systems. No significant change last 24 hours. No significant improvement in mental status  Review of Systems:  Constitutional: Feels well. Cardiovascular: No chest pain.  Pulmonary: Denies dyspnea.   The remainder of systems were reviewed and were found to be negative other than what is documented in the HPI.    VITAL SIGNS: Temp:  [97.5 F (36.4 C)-98.1 F (36.7 C)] 97.7 F (36.5 C) (01/21 0700) Pulse Rate:  [62-78] 66 (01/21 0700) Resp:  [13-25] 13 (01/21 0700) BP: (122-228)/(73-134) 228/134  (01/21 0833) SpO2:  [96 %-98 %] 97 % (01/21 0700) FiO2 (%):  [30 %-40 %] 40 % (01/21 0829) Weight:  [87.1 kg (192 lb 0.3 oz)] 87.1 kg (192 lb 0.3 oz) (01/21 0357)   VENTILATOR SETTINGS: Vent Mode: PRVC FiO2 (%):  [30 %-40 %] 40 % Set Rate:  [20 bmp] 20 bmp Vt Set:  [450 mL] 450 mL PEEP:  [5 cmH20] 5 cmH20 Plateau Pressure:  [25 cmH20] 25 cmH20   INTAKE / OUTPUT:  Intake/Output Summary (Last 24 hours) at 01/25/16 0856 Last data filed at 01/25/16 0723  Gross per 24 hour  Intake             1618 ml  Output             1495 ml  Net              123 ml    PHYSICAL EXAMINATION: Physical Examination:   VS: BP (!) 228/134   Pulse 66   Temp 97.7 F (36.5 C)   Resp 13   Ht 5\' 6"  (1.676 m)   Wt 87.1 kg (192 lb 0.3 oz)   SpO2 97%   BMI 30.99 kg/m   General Appearance: Patient presently unresponsive on mechanical ventilation  HEENT: PERRLA, EOM intact. Pulmonary: Bilateral basilar crackles appreciated. Subcutaneous air is appreciated on the left on exam, chest tube is in place  CardiovascularNormal S1,S2.  No m/r/g.   Abdomen: Benign, Soft, non-tender. Renal:  No costovertebral tenderness  GU:  Not performed at this time. Endocrine: No evident thyromegaly. Skin:   warm, no rashes Extremities: Edema appreciated   LABS:   LABORATORY PANEL:   CBC  Recent Labs Lab 01/25/16 0402  WBC 14.8*  HGB  11.0*  HCT 32.7*  PLT 137*    Chemistries   Recent Labs Lab 01/25/16 0402  NA 145  K 3.9  CL 112*  CO2 29  GLUCOSE 137*  BUN 41*  CREATININE 0.60  CALCIUM 8.3*  MG 2.0  PHOS 3.8     Recent Labs Lab 01/24/16 1128 01/24/16 1717 01/24/16 2023 01/25/16 0019 01/25/16 0412 01/25/16 0726  GLUCAP 153* 148* 163* 148* 117* 160*    Recent Labs Lab 01/20/16 1700 01/21/16 0508 01/22/16 0427  PHART 7.42 7.43 7.41  PCO2ART 52* 52* 50*  PO2ART 88 69* 84    Recent Labs Lab 01/21/16 0730  ALBUMIN 2.6*    Cardiac Enzymes  Recent Labs Lab  01/23/16 2027  TROPONINI <0.03    RADIOLOGY:  Ct Head Wo Contrast  Result Date: 01/23/2016 CLINICAL DATA:  Sudden increase in blood pressure, abnormal breathing, EXAM: CT HEAD WITHOUT CONTRAST TECHNIQUE: Contiguous axial images were obtained from the base of the skull through the vertex without intravenous contrast. COMPARISON:  01/22/2016 FINDINGS: Brain: Again visualized is subtle loss of the gray-white matter differentiation and sulcal effacement involving the posterior cerebral hemispheres. Abnormal low attenuation is present within the right greater than left basal ganglia slightly progressed in the interim. Suspect that there is edema in the right temporal lobe as well. No acute hemorrhage. No midline shift. Basilar cisterns appear patent. The ventricles are nonenlarged. Vascular: No hyperdense vessels. Scattered calcifications at the carotid siphons. Skull: Mastoid air cells are clear.  There is no fracture Sinuses/Orbits: Mild mucosal thickening within the ethmoid and sphenoid sinuses. No acute orbital abnormality. Other: None IMPRESSION: 1. Again visualized is generalized sulcal effacement and loss of gray-white matter differentiation in the posterior cerebral hemispheres. There is abnormal hypoattenuation of the right greater than left basal ganglia, slightly progressed, findings consistent with hypoxic ischemic injury. There is no hemorrhage. The basilar cisterns remain patent. Electronically Signed   By: Donavan Foil M.D.   On: 01/23/2016 20:15   Dg Chest Port 1 View  Result Date: 01/25/2016 CLINICAL DATA:  Respiratory failure EXAM: PORTABLE CHEST 1 VIEW COMPARISON:  January 23, 2016 FINDINGS: A left chest tube remains in place. The left-sided pneumothorax is much larger in the interval measuring 5.3 cm at the apex today and barely visible previously. The air outlines the lateral aspect of the left lung extending to the base. No right-sided pneumothorax. Air in the subcutaneous tissues of  the bilateral chest wall is stable. The cardiomediastinal silhouette is stable. The ETT is in good position. The right PICC line is stable. The NG tube terminates below today's film. IMPRESSION: 1. Stable left chest tube. However, there is a moderate left-sided pneumothorax which is much larger in the interval, barely visible previously. 2. Stable support apparatus. 3. No other acute abnormality. Findings called to the nurse in the ICU, Adalberto Ill. Electronically Signed   By: Dorise Bullion III M.D   On: 01/25/2016 08:24       Hermelinda Dellen, DO [facility] Office Number: [phone]  Patricia Pesa, M.D.  Vilinda Boehringer, M.D.  Merton Border, M.D  01/25/2016

## 2016-01-25 NOTE — Progress Notes (Signed)
Seen this am by Dr Gillermina Phy. Decrease in sedation to precedex 0.6 trialed. Pt does not exhibit more neurological function at decreased dose but does appear stressed.  HR, BP,RR and work of breathing much increased. MD aware and at bedside. Hydralazine, metoprolol, fentanyl and versed were needed to get pt to relaxed state with normal VS. Precedex now at 1.2 mcg and we are adding fentanyl gtt as soon as available. Partner of 30 yrs at bedside. Family meeting to meet with Dr Nancie Neas and Dr Jefferson Fuel around 11am

## 2016-01-25 NOTE — Progress Notes (Signed)
Pt. Remains on Precedex gtt due to discomfort of tube - pt. Coughs, bites tube, high peak pressures. VSS, pt. Received PRN's for HTN x 1 and pain PRN's x 3. Pt. Continuing to have diarrhea- senna & colace held.  UOP 50 mL on average. Chest tube continues to have 50 mL out per shift. No concerns at this time.

## 2016-01-25 NOTE — Consult Note (Signed)
Reason for Consult:anoxic brain injury Referring Physician: Dr. Jefferson Fuel  CC: anoxic brain injury   HPI: Dawn Donaldson is an 66 y.o. female admitted with acute hypercapnic respiratory failure due to viral bronchitis that progressed to cardiac arrest.    Past Medical History:  Diagnosis Date  . Adenomatous colon polyp   . Anginal pain ([place])   . CAD (coronary artery disease)    s/p PCI  . Chronic low back pain   . Chronic lower back pain   . Colon polyps   . COPD (chronic obstructive pulmonary disease) ([place])    on nocturnal home o2  . CVA (cerebral infarction)   . Diastolic CHF ([place])   . DJD (degenerative joint disease)   . DJD (degenerative joint disease)   . GERD (gastroesophageal reflux disease)   . History of hiatal hernia   . Hypercholesteremia   . Hyperlipidemia   . Hypertension   . Hypoxemia   . Obesity   . Osteoporosis   . Peripheral vascular disease ([place])   . Stroke [facility])     Past Surgical History:  Procedure Laterality Date  . ABDOMINAL HYSTERECTOMY    . APPENDECTOMY    . BACK SURGERY  2002,2003  . CARDIAC CATHETERIZATION Left 06/20/2015   Procedure: Left Heart Cath and Coronary Angiography;  Surgeon: Yolonda Kida, MD;  Location: [facility];  Service: Cardiovascular;  Laterality: Left;  . CAROTID ENDARTERECTOMY  2009   Right  . CHOLECYSTECTOMY    . CORONARY ANGIOPLASTY  2008   with stent  . EYE SURGERY    . IMPLANTATION BONE ANCHORED HEARING AID  2010  . PARTIAL HYSTERECTOMY    . VEIN LIGATION AND STRIPPING      Family History  Problem Relation Age of Onset  . Atrial fibrillation Mother   . CAD Father     Social History:  reports that she has been smoking Cigarettes.  She has a 4.00 pack-year smoking history. She has never used smokeless tobacco. She reports that she does not drink alcohol or use drugs.  Allergies  Allergen Reactions  . Dexlansoprazole Nausea And Vomiting  . Ultram [Tramadol] Itching    Medications: I have  reviewed the patient's current medications.  ROS: Unable to obtain as pt does not follow commands and is sedated   Physical Examination: Blood pressure 100/60, pulse 68, temperature 99 F (37.2 C), resp. rate (!) 21, height 5\' 6"  (1.676 m), weight 87.1 kg (192 lb 0.3 oz), SpO2 97 %.  Pt is on sedation which is when taken off spontaneously moves her upper extremities but doesn't follow commands. No purposeful movements.    Laboratory Studies:   Basic Metabolic Panel:  Recent Labs Lab 01/20/16 0539  01/20/16 1840 01/21/16 0533 01/21/16 1716 01/22/16 0445 01/23/16 0542 01/24/16 0430 01/25/16 0402  NA 143  --   --  140  --  143 145  --  145  K 3.4*  --   --  3.3*  --  3.4* 3.7  --  3.9  CL 106  --   --  103  --  109 111  --  112*  CO2 27  --   --  32  --  31 28  --  29  GLUCOSE 292*  --   --  118*  --  173* 129*  --  137*  BUN 24*  --   --  24*  --  24* 31*  --  41*  CREATININE 1.16*  --   --  0.92  --  0.67 0.57  --  0.60  CALCIUM 6.8*  --   --  7.2*  --  7.8* 8.0*  --  8.3*  MG 1.4*  < > 2.3 2.3 2.1 2.1  --   --  2.0  PHOS 1.6*  < > 3.1 3.0 2.1* 3.2  --  3.2 3.8  < > = values in this interval not displayed.  Liver Function Tests:  Recent Labs Lab 01/21/16 0730  ALBUMIN 2.6*   No results for input(s): LIPASE, AMYLASE in the last 168 hours. No results for input(s): AMMONIA in the last 168 hours.  CBC:  Recent Labs Lab 01/21/16 0533 01/22/16 0445 01/23/16 0542 01/24/16 0430 01/25/16 0402  WBC 22.8* 18.4* 17.1* 13.0* 14.8*  HGB 10.2* 9.6* 9.6* 10.5* 11.0*  HCT 30.5* 28.7* 28.7* 31.3* 32.7*  MCV 98.4 98.8 98.1 98.5 97.8  PLT 134* 108* 118* 126* 137*    Cardiac Enzymes:  Recent Labs Lab 02/04/2016 1537 01/20/16 0539 01/20/16 1110 01/20/16 1848 01/23/16 2027  CKMB  --   --   --   --  1.4  TROPONINI 0.04* 0.16* 0.26* 0.16* <0.03    BNP: Invalid input(s): POCBNP  CBG:  Recent Labs Lab 01/24/16 2023 01/25/16 0019 01/25/16 0412 01/25/16 0726  01/25/16 1202  GLUCAP 163* 148* 117* 160* 156*    Microbiology: Results for orders placed or performed during the hospital encounter of 01/11/2016  Urine culture     Status: None   Collection Time: 02/02/2016  4:25 PM  Result Value Ref Range Status   Specimen Description URINE, RANDOM  Final   Special Requests NONE  Final   Culture   Final    NO GROWTH Performed at [facility], [place] [address], [place], [place] [address]    Report Status 01/21/2016 FINAL  Final  Culture, blood (routine x 2)     Status: None   Collection Time: 02/03/2016  5:58 PM  Result Value Ref Range Status   Specimen Description BLOOD LEFT AC  Final   Special Requests   Final    BOTTLES DRAWN AEROBIC AND ANAEROBIC AER 11ML ANA 4ML   Culture NO GROWTH 5 DAYS  Final   Report Status 01/24/2016 FINAL  Final  Culture, blood (routine x 2)     Status: None   Collection Time: 01/28/2016  5:58 PM  Result Value Ref Range Status   Specimen Description BLOOD CENTRAL LINE  Final   Special Requests   Final    BOTTLES DRAWN AEROBIC AND ANAEROBIC AER 6ML ANA 8ML   Culture NO GROWTH 5 DAYS  Final   Report Status 01/24/2016 FINAL  Final  MRSA PCR Screening     Status: None   Collection Time: 01/17/2016  8:04 PM  Result Value Ref Range Status   MRSA by PCR NEGATIVE NEGATIVE Final    Comment:        The GeneXpert MRSA Assay (FDA approved for NASAL specimens only), is one component of a comprehensive MRSA colonization surveillance program. It is not intended to diagnose MRSA infection nor to guide or monitor treatment for MRSA infections.     Coagulation Studies: No results for input(s): LABPROT, INR in the last 72 hours.  Urinalysis:  Recent Labs Lab 01/31/2016 1625  COLORURINE YELLOW*  LABSPEC 1.013  PHURINE 5.0  GLUCOSEU 50*  HGBUR MODERATE*  BILIRUBINUR NEGATIVE  KETONESUR NEGATIVE  PROTEINUR >=300*  NITRITE NEGATIVE  LEUKOCYTESUR NEGATIVE    Lipid Panel:  Component Value Date/Time   TRIG 76  01/23/2016 0542    HgbA1C:  Lab Results  Component Value Date   HGBA1C 5.7 06/05/2015    Urine Drug Screen:  No results found for: LABOPIA, COCAINSCRNUR, LABBENZ, AMPHETMU, THCU, LABBARB  Alcohol Level: No results for input(s): ETH in the last 168 hours.  Other results: EKG: normal EKG, normal sinus rhythm, unchanged from previous tracings.  Imaging: Ct Head Wo Contrast  Result Date: 01/23/2016 CLINICAL DATA:  Sudden increase in blood pressure, abnormal breathing, EXAM: CT HEAD WITHOUT CONTRAST TECHNIQUE: Contiguous axial images were obtained from the base of the skull through the vertex without intravenous contrast. COMPARISON:  01/22/2016 FINDINGS: Brain: Again visualized is subtle loss of the gray-white matter differentiation and sulcal effacement involving the posterior cerebral hemispheres. Abnormal low attenuation is present within the right greater than left basal ganglia slightly progressed in the interim. Suspect that there is edema in the right temporal lobe as well. No acute hemorrhage. No midline shift. Basilar cisterns appear patent. The ventricles are nonenlarged. Vascular: No hyperdense vessels. Scattered calcifications at the carotid siphons. Skull: Mastoid air cells are clear.  There is no fracture Sinuses/Orbits: Mild mucosal thickening within the ethmoid and sphenoid sinuses. No acute orbital abnormality. Other: None IMPRESSION: 1. Again visualized is generalized sulcal effacement and loss of gray-white matter differentiation in the posterior cerebral hemispheres. There is abnormal hypoattenuation of the right greater than left basal ganglia, slightly progressed, findings consistent with hypoxic ischemic injury. There is no hemorrhage. The basilar cisterns remain patent. Electronically Signed   By: Donavan Foil M.D.   On: 01/23/2016 20:15   Dg Chest Port 1 View  Result Date: 01/25/2016 CLINICAL DATA:  Respiratory failure EXAM: PORTABLE CHEST 1 VIEW COMPARISON:  January 23, 2016 FINDINGS: A left chest tube remains in place. The left-sided pneumothorax is much larger in the interval measuring 5.3 cm at the apex today and barely visible previously. The air outlines the lateral aspect of the left lung extending to the base. No right-sided pneumothorax. Air in the subcutaneous tissues of the bilateral chest wall is stable. The cardiomediastinal silhouette is stable. The ETT is in good position. The right PICC line is stable. The NG tube terminates below today's film. IMPRESSION: 1. Stable left chest tube. However, there is a moderate left-sided pneumothorax which is much larger in the interval, barely visible previously. 2. Stable support apparatus. 3. No other acute abnormality. Findings called to the nurse in the ICU, Adalberto Ill. Electronically Signed   By: Dorise Bullion III M.D   On: 01/25/2016 08:24     Assessment/Plan:  66 y.o. female admitted with acute hypercapnic respiratory failure due to viral bronchitis that progressed to cardiac arrest.  Pt s/p CTH x 2 that showed significant swelling and loss of grey white matter consistent with anoxic brain injury.   Pt had EEG that did not show any seizure activity   Long discussion with family at bedside today. Condition explained and likely hood of poor prognosis . They have made it clear that pt would not want to go to Nursing facility and would not want Trach/Peg.  They all agreed on DNR order and likely withdrawal of care in the next few days.  DNR order placed.  Please call me if there are any questions and would be happy to have further discussions with family   Critical care time spent 35 minutes.  Leotis Pain   01/25/2016, 12:37 PM

## 2016-01-26 LAB — CREATININE, SERUM
Creatinine, Ser: 0.6 mg/dL (ref 0.44–1.00)
GFR calc non Af Amer: >60 mL/min (ref >60)

## 2016-01-26 LAB — GLUCOSE, CAPILLARY
GLUCOSE-CAPILLARY: 121 mg/dL — AB (ref 65–99)
Glucose-Capillary: 120 mg/dL — ABNORMAL HIGH (ref 65–99)
Glucose-Capillary: 135 mg/dL — ABNORMAL HIGH (ref 65–99)

## 2016-01-26 MED ORDER — MORPHINE BOLUS VIA INFUSION
5.0000 mg | INTRAVENOUS | Status: DC | PRN
  Administered 2016-01-26 (×3): 10 mg via INTRAVENOUS
  Administered 2016-01-26: 5 mg via INTRAVENOUS
  Filled 2016-01-26: qty 20

## 2016-01-26 MED ORDER — LORAZEPAM 2 MG/ML IJ SOLN
2.0000 mg | INTRAMUSCULAR | Status: DC | PRN
  Administered 2016-01-26 (×2): 2 mg via INTRAVENOUS
  Filled 2016-01-26 (×3): qty 1

## 2016-01-26 MED ORDER — LORAZEPAM 2 MG/ML IJ SOLN
2.0000 mg | INTRAMUSCULAR | Status: DC | PRN
  Administered 2016-01-26: 2 mg via INTRAVENOUS

## 2016-01-26 MED ORDER — LORAZEPAM 2 MG/ML IJ SOLN: 2.0000 mg | INTRAMUSCULAR | Status: DC | PRN

## 2016-01-26 MED ORDER — MORPHINE 100MG IN NS 100ML (1MG/ML) PREMIX INFUSION
10.0000 mg/h | INTRAVENOUS | Status: DC
  Administered 2016-01-26 (×2): 10 mg/h via INTRAVENOUS
  Filled 2016-01-26 (×2): qty 100

## 2016-01-28 ENCOUNTER — Telehealth: Payer: Self-pay

## 2016-01-28 NOTE — Telephone Encounter (Signed)
Placed Death Certificate On nurse desk for signing.

## 2016-01-28 NOTE — Telephone Encounter (Signed)
Death certificate filled and faxed to funeral home and placed up front for pickup.

## 2016-02-05 NOTE — Progress Notes (Signed)
Pt. Had several episodes of anxiety and agitation, that were adequately controlled with PRN and gtt. Medications. VSS overnight, UOP averaging 75 mL/h, neuro status remains unchanged.  Family tearful but supportive of patient and each other.   Report given to [facility].

## 2016-02-05 NOTE — Progress Notes (Signed)
Patient extubated to room air per verbal order from Dr. Mortimer Fries.  Patient is now comfort measures only

## 2016-02-05 NOTE — Progress Notes (Signed)
The Nurse paged [facility] to provide emotional support for a family of a Pt who expired in [place]. [place] with family members, encouraged them, and offered prayers and ministry of compassion and presence. [place] informed the family that [facility] is present if needed.     Feb 06, 2016 1600  Clinical Encounter Type  Visited With Patient;Patient and family together  Visit Type Initial;Death;Other (Comment)  Referral From Nurse  Consult/Referral To Chaplain  Spiritual Encounters  Spiritual Needs Prayer;Emotional;Other (Comment)  Stress Factors  Patient Stress Factors Other (Comment)  Family Stress Factors Loss

## 2016-02-05 NOTE — Progress Notes (Signed)
After further assessment, patient with severe anoxic brain injury and multiorgan failure, the family/daughter have agreed and consented to comfort Care measures Comfort care orders placed    Zayonna Ayuso Patricia Pesa, M.D.  [facility]  Medical Director [place] Director [facility] Cardio-Pulmonary Department

## 2016-02-05 NOTE — Discharge Summary (Signed)
  [place] Medicine Progess Note  DEATH SUMMARY    Name: Dawn Donaldson MRN:   [id number] DOB:   August 02, 1950             ADMISSION DATE:  01/24/2016   CHIEF COMPLAINT/admission DX:  Cardiac arrest Death DX cardiac arrest   PROCEDURES during admission intubation Central line placement Foley catheter placement Vent support Neurology consult with EEG Chest tube for PTX    DEATH SDEATh DEA ASSESSMENT/PLAN   Acute hypoxemic and hypercapnic respiratory failure secondary to viral bronchitis. Resultant cardiac arrest, with respiratory most likely underlying etiology. Presently on mechanical ventilation.No significant improvement in mental status. Greatly appreciate neurology's assistance with management. Tried to stop sedation today resulting in severe agitation, hypertension but no appropriate response. Not much additional reversible disease at this point.    Overall, prognosis is very poor, patient now DNR status and assess for comfort care measures as consented by family   Patient was extubated, started on moprhine infusion. Patient expired around 400PM om January 30, 2016  Corrin Parker, M.D.  [facility]  Medical Director [place] Director [facility] Cardio-Pulmonary Department

## 2016-02-05 NOTE — Progress Notes (Signed)
[place] Medicine Progess Note    ASSESSMENT/PLAN   Acute hypoxemic and hypercapnic respiratory failure secondary to viral bronchitis. Resultant cardiac arrest, with respiratory most likely underlying etiology. Presently on mechanical ventilation.No significant improvement in mental status. Greatly appreciate neurology's assistance with management. Tried to stop sedation today resulting in severe agitation, hypertension but no appropriate response. Not much additional reversible disease at this point.   Pneumothorax. Chest x-ray shows some increasing pneumothorax. Chest tube evaluated does not reveal any air leak, there is tidalling noted in pleurovac. Will increase suction to Minus 40 done on 1/21.   Status post PA arrest. Underlying history to include hypertension, hyperlipidemia, diastolic congestive heart failure, coronary artery disease. No arrhythmias noted, peak troponin is 0.16. pending echocardiography  Prerenal azotemia. BUN is 41 over creatinine 0.6, will follow  Leukocytosis. Presently 14.8, thus far cultures are negative. Will complete 7 day course of Zosyn, and 5 days of Tamiflu  On Lovenox and Pepcid for prophylaxis.  Overall, prognosis is very poor, recommend DNR status and assess for comfort care measures if family agrees    Name: Lieren Killey MRN: [id number] DOB: [birth year]    ADMISSION DATE:  01/08/2016   SUBJECTIVE:   Pt currently on the ventilator, can not provide history or review of systems. No significant change last 24 hours. No significant improvement in mental status  Review of Systems:  Unobtainable due to critical illness  VITAL SIGNS: Temp:  [98.1 F (36.7 C)-100.4 F (38 C)] 100 F (37.8 C) (01/22 0600) Pulse Rate:  [65-119] 69 (01/22 0600) Resp:  [16-24] 20 (01/22 0600) BP: (91-261)/(58-134) 141/76 (01/22 0600) SpO2:  [91 %-99 %] 95 % (01/22 0600) FiO2 (%):  [30 %-40 %] 30 % (01/22 0736) Weight:  [197 lb 8.5 oz (89.6  kg)] 197 lb 8.5 oz (89.6 kg) (01/22 0425)   VENTILATOR SETTINGS: Vent Mode: PRVC FiO2 (%):  [30 %-40 %] 30 % Set Rate:  [20 bmp] 20 bmp Vt Set:  [450 mL] 450 mL PEEP:  [5 cmH20] 5 cmH20 Plateau Pressure:  [18 cmH20-26 cmH20] 22 cmH20   INTAKE / OUTPUT:  Intake/Output Summary (Last 24 hours) at 2016/01/29 0821 Last data filed at 2016-01-29 0600  Gross per 24 hour  Intake          2233.65 ml  Output             1900 ml  Net           333.65 ml    PHYSICAL EXAMINATION: Physical Examination:   VS: BP (!) 141/76   Pulse 69   Temp 100 F (37.8 C)   Resp 20   Ht 5\' 6"  (1.676 m)   Wt 197 lb 8.5 oz (89.6 kg)   SpO2 95%   BMI 31.88 kg/m   General Appearance: Patient presently unresponsive on mechanical ventilation  HEENT: PERRLA, EOM intact. Pulmonary: Bilateral basilar crackles appreciated. Subcutaneous air is appreciated on the left on exam, chest tube is in place  CardiovascularNormal S1,S2.  No m/r/g.   Abdomen: Benign, Soft, non-tender. Renal:  No costovertebral tenderness  GU:  Not performed at this time. Endocrine: No evident thyromegaly. Skin:   warm, no rashes Extremities: Edema appreciated GCS<8T  LABS:   LABORATORY PANEL:   CBC  Recent Labs Lab 01/25/16 0402  WBC 14.8*  HGB 11.0*  HCT 32.7*  PLT 137*    Chemistries   Recent Labs Lab 01/25/16 0402 01/29/2016 0500  NA 145  --  K 3.9  --   CL 112*  --   CO2 29  --   GLUCOSE 137*  --   BUN 41*  --   CREATININE 0.60 0.60  CALCIUM 8.3*  --   MG 2.0  --   PHOS 3.8  --      Recent Labs Lab 01/25/16 1202 01/25/16 1603 01/25/16 1929 01/25/16 2340 2016-02-09 0345 Feb 09, 2016 0715  GLUCAP 156* 137* 172* 130* 121* 120*    Recent Labs Lab 01/20/16 1700 01/21/16 0508 01/22/16 0427  PHART 7.42 7.43 7.41  PCO2ART 52* 52* 50*  PO2ART 88 69* 84    Recent Labs Lab 01/21/16 0730  ALBUMIN 2.6*    Cardiac Enzymes  Recent Labs Lab 01/23/16 2027  TROPONINI <0.03    RADIOLOGY:  Dg  Chest Port 1 View  Result Date: 01/25/2016 CLINICAL DATA:  Respiratory failure EXAM: PORTABLE CHEST 1 VIEW COMPARISON:  January 23, 2016 FINDINGS: A left chest tube remains in place. The left-sided pneumothorax is much larger in the interval measuring 5.3 cm at the apex today and barely visible previously. The air outlines the lateral aspect of the left lung extending to the base. No right-sided pneumothorax. Air in the subcutaneous tissues of the bilateral chest wall is stable. The cardiomediastinal silhouette is stable. The ETT is in good position. The right PICC line is stable. The NG tube terminates below today's film. IMPRESSION: 1. Stable left chest tube. However, there is a moderate left-sided pneumothorax which is much larger in the interval, barely visible previously. 2. Stable support apparatus. 3. No other acute abnormality. Findings called to the nurse in the ICU, Adalberto Ill. Electronically Signed   By: Dorise Bullion III M.D   On: 01/25/2016 08:24    I have personally obtained a history, examined the patient, evaluated Pertinent laboratory and RadioGraphic/imaging results, and  formulated the assessment and plan   The Patient requires high complexity decision making for assessment and support, frequent evaluation and titration of therapies, application of advanced monitoring technologies and extensive interpretation of multiple databases. Critical Care Time devoted to patient care services described in this note is 35 minutes.   Overall, patient is critically ill, prognosis is guarded.  Patient with Multiorgan failure and at high risk for cardiac arrest and death.    Corrin Parker, M.D.  [facility]  Medical Director [place] Director [facility] Cardio-Pulmonary Department

## 2016-02-05 NOTE — Progress Notes (Signed)
[place] responded to an OR for End of Life in room IC-11. Pt was intubated and non-responsive. Several family members and friends were bedside. RN extubated the Pt. Partner, daughter, and granddaughter remained bedside. Other family and friends were escorted to waiting area. [facility] provided the ministry of presence, empathetic listening, scripture, and prayer. Seeing the family was at peace, [facility] excused himself, and will follow up as needed.    17-Feb-2016 1300  Clinical Encounter Type  Visited With Patient;Patient and family together;Health care provider  Visit Type Initial;Spiritual support;Critical Care;Patient actively dying  Referral From Nurse  Consult/Referral To Chaplain  Spiritual Encounters  Spiritual Needs Prayer;Emotional;Grief support  Stress Factors  Patient Stress Factors Major life changes  Family Stress Factors Exhausted;Health changes;Major life changes

## 2016-02-05 NOTE — Progress Notes (Signed)
Nutrition Brief Note  Chart reviewed. Pt now transitioning to comfort care. Extubated, NPO No further nutrition interventions warranted at this time.  Please re-consult as needed.   Kerman Passey [place], [place], LDN [phone] Pager  [phone] Weekend/On-Call Pager

## 2016-02-05 DEATH — deceased

## 2018-03-05 IMAGING — CT CT ABD-PELV W/O CM
2 of 4 series · 16 of 46 positions shown, 18 images · non-contrast
Comparison: None.

CLINICAL DATA: Unresponsive, cardiac arrest. Free intraperitoneal
air. Sepsis.

EXAM:
CT ABDOMEN AND PELVIS WITHOUT CONTRAST
TECHNIQUE: Multidetector CT imaging of the abdomen and pelvis was performed
following the standard protocol without IV contrast.

[Series 2: axial st · axial · 0.87mm/px · z∈[-578,-173]mm · 13 of 89 slices shown, 15 images]
[im 4/89  soft-tissue]
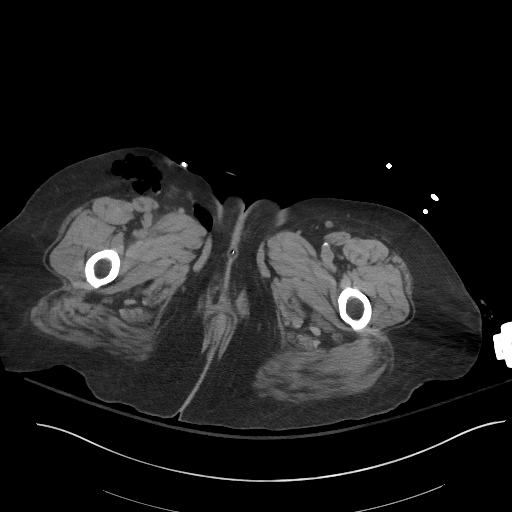
[im 4/89  bone]
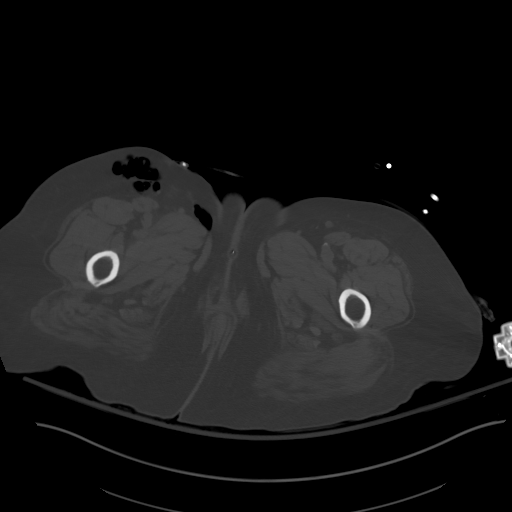
[im 12/89  soft-tissue]
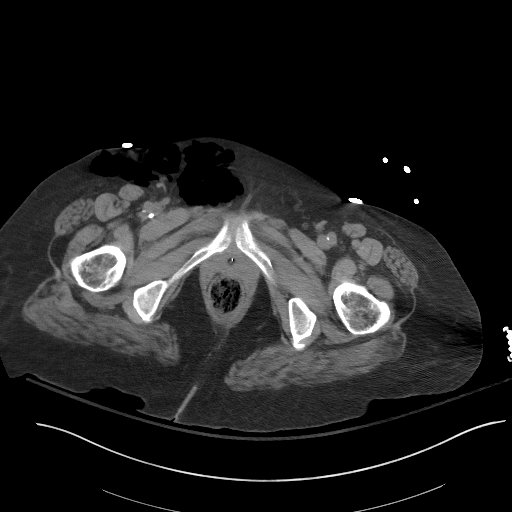
[im 19/89  soft-tissue]
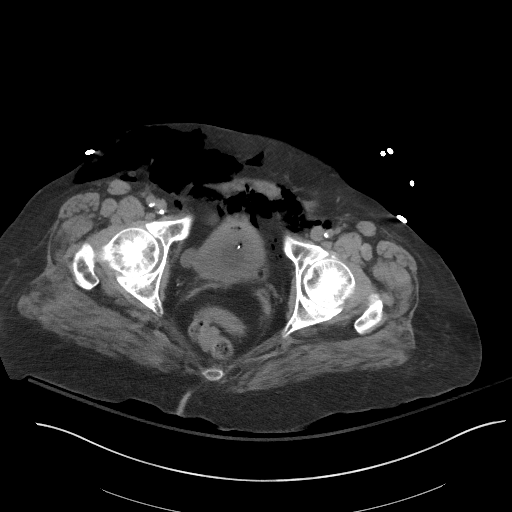
[im 26/89  soft-tissue]
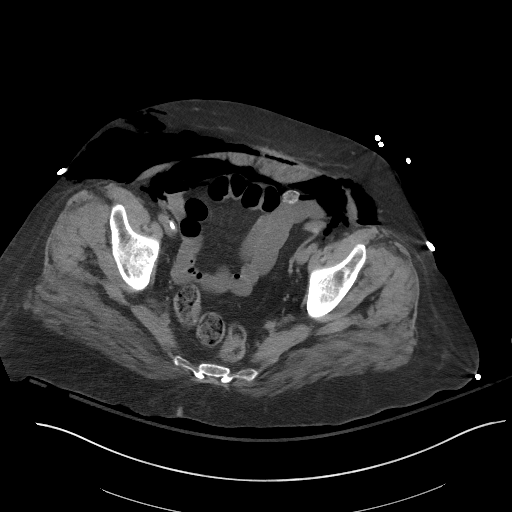
[im 30/89  soft-tissue]
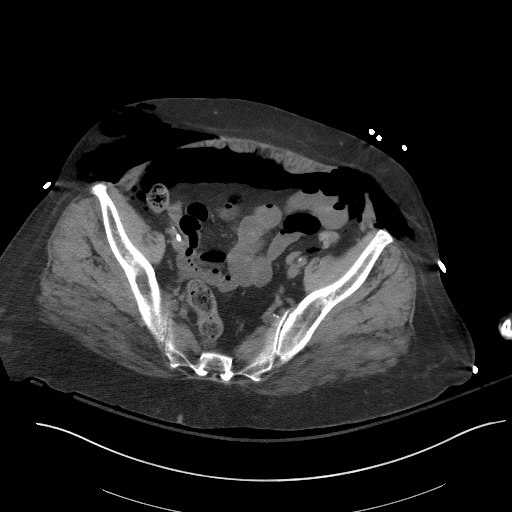
[im 37/89  soft-tissue]
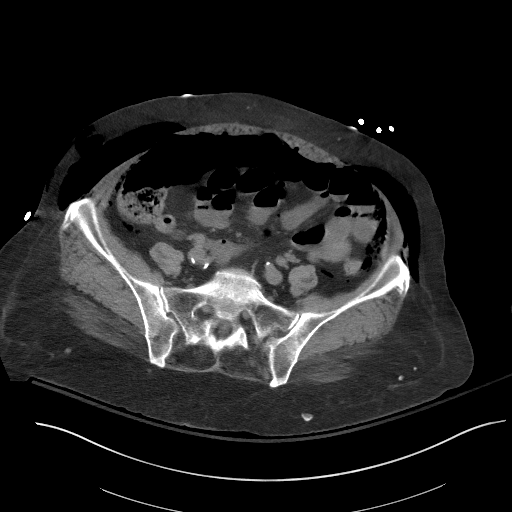
[im 45/89  soft-tissue]
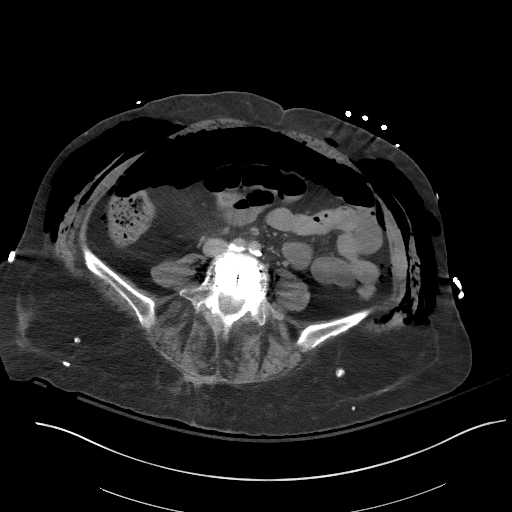
[im 52/89  soft-tissue]
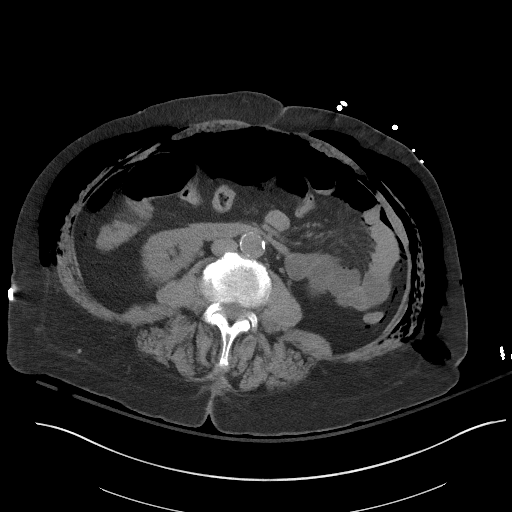
[im 59/89  soft-tissue]
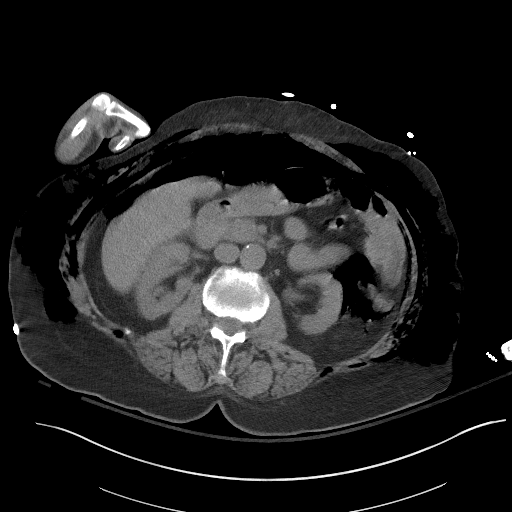
[im 59/89  bone]
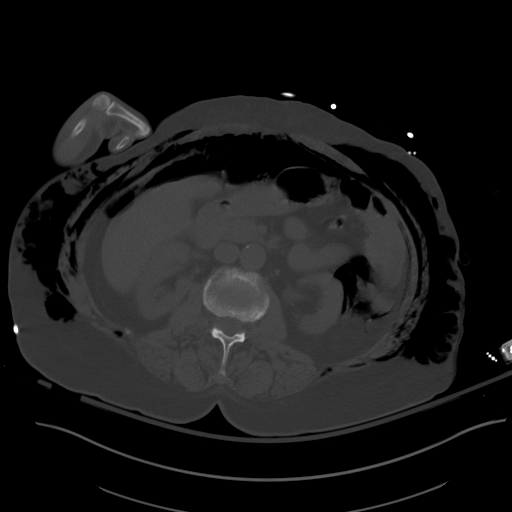
[im 63/89  soft-tissue]
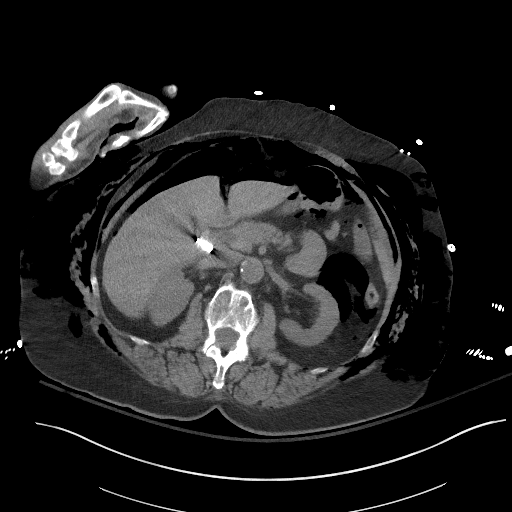
[im 70/89  soft-tissue]
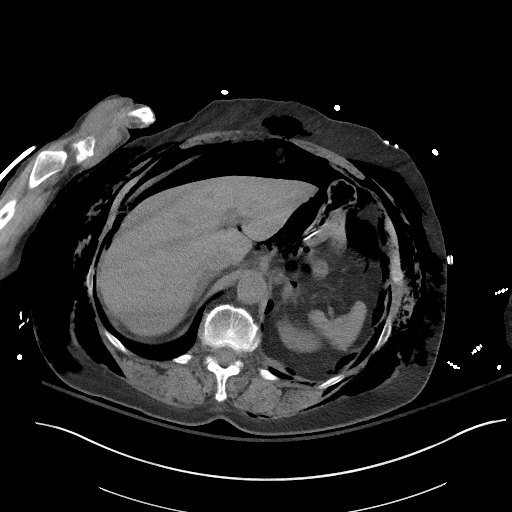
[im 78/89  soft-tissue]
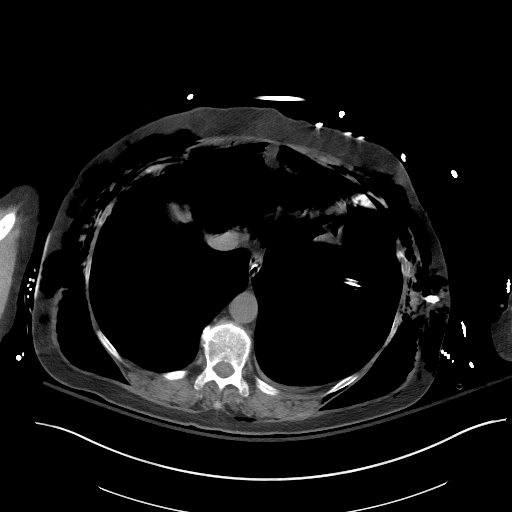
[im 85/89  soft-tissue]
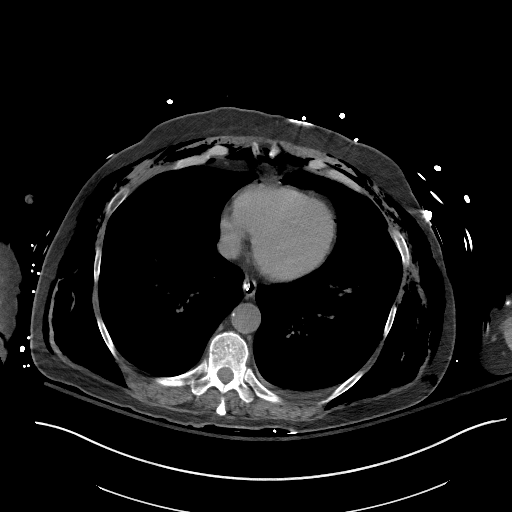

[Series 5: coronal st · coronal · 0.77mm/px · 3 of 101 slices shown]
[im 34/101  soft-tissue]
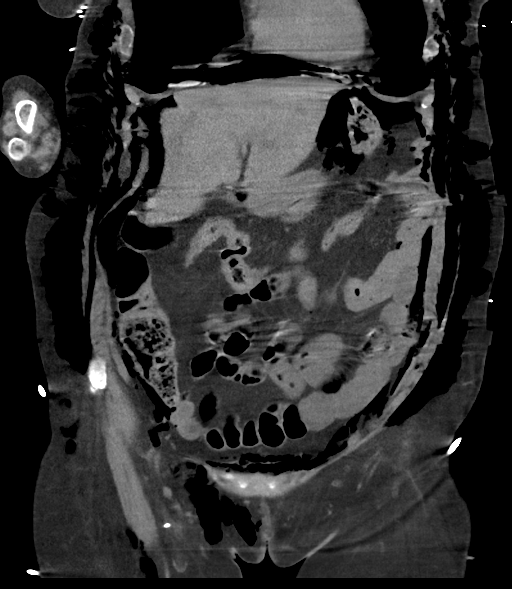
[im 45/101  soft-tissue]
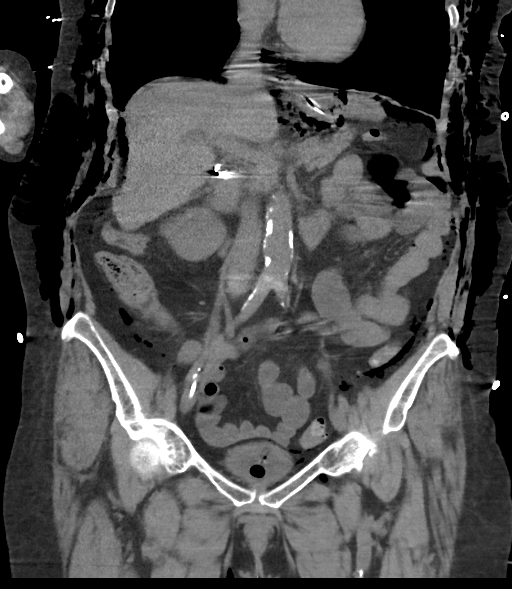
[im 56/101  soft-tissue]
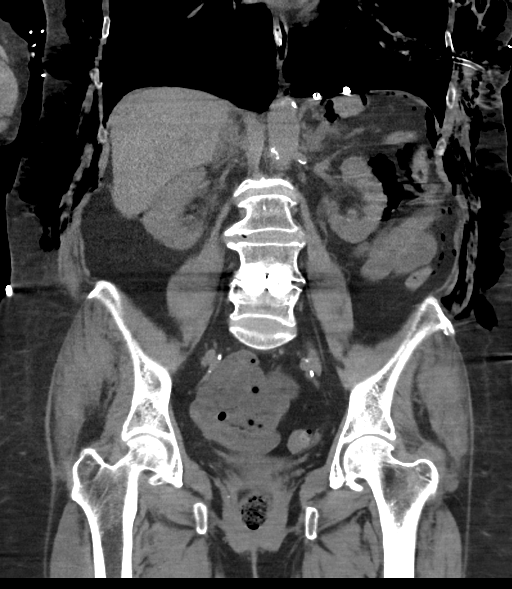

[16 of 46 positions shown; findings below may reference images not displayed]

FINDINGS: Lower chest: Small residual pneumothorax at the left lung base after
chest tube placement. Extensive subcutaneous air within the lower
chest walls bilaterally.

Hepatobiliary: Status post cholecystectomy. No focal liver
abnormality. No bile duct dilatation.

Pancreas: Unremarkable. No pancreatic ductal dilatation or
surrounding inflammatory changes.

Spleen: Normal in size without focal abnormality.

Adrenals/Urinary Tract: Adrenal glands are unremarkable. 1 mm
nonobstructing left renal stone. No hydronephrosis bilaterally. No
ureteral or bladder calculi identified. Bladder decompressed by
Foley catheter.

Stomach/Bowel: Bowel is normal in caliber. Scattered diverticulosis
within the descending and sigmoid colon but no evidence of acute
diverticulitis. Stomach is unremarkable, partially decompressed by
nasogastric tube.

Vascular/Lymphatic: Aortic atherosclerosis. No enlarged lymph nodes
seen.

Reproductive: Status post hysterectomy.

Other: Large amount of free intraperitoneal air within the abdomen
and pelvis, uncertain source.

Musculoskeletal: Large amount of extra abdominal air within the
superficial soft tissues of the abdominal and pelvic walls.

Degenerative changes throughout the thoracolumbar spine. No acute or
suspicious osseous finding in the abdomen or pelvis.
IMPRESSION: 1. Large amount of free intraperitoneal air within the abdomen and
pelvis. This is of uncertain etiology. The bowel is unremarkable and
there is no definite bowel perforation identified. I favor that this
free intraperitoneal air has dissected down from the intrathoracic
cavity with accentuation by recent CPR.
2. Small residual pneumothorax at the left lung base, decreased
after chest tube placement.
3. Large amount of associated subcutaneous emphysema within the
superficial soft tissues of the chest and abdominal walls.
4. Aortic atherosclerosis. Additional chronic/incidental findings
detailed above.
